# Patient Record
Sex: Female | Born: 1970 | Race: White | Hispanic: No | Marital: Married | Smoking: Never smoker
Health system: Southern US, Community
[De-identification: ages and names within clinical notes are randomized; demographics above are authoritative.]

## PROBLEM LIST (undated history)

## (undated) DIAGNOSIS — E039 Hypothyroidism, unspecified: Secondary | ICD-10-CM

## (undated) DIAGNOSIS — F419 Anxiety disorder, unspecified: Secondary | ICD-10-CM

## (undated) DIAGNOSIS — N943 Premenstrual tension syndrome: Secondary | ICD-10-CM

## (undated) DIAGNOSIS — R112 Nausea with vomiting, unspecified: Secondary | ICD-10-CM

## (undated) DIAGNOSIS — Z9889 Other specified postprocedural states: Secondary | ICD-10-CM

## (undated) DIAGNOSIS — M5416 Radiculopathy, lumbar region: Secondary | ICD-10-CM

## (undated) DIAGNOSIS — M1990 Unspecified osteoarthritis, unspecified site: Secondary | ICD-10-CM

## (undated) DIAGNOSIS — A6920 Lyme disease, unspecified: Secondary | ICD-10-CM

## (undated) HISTORY — DX: Lyme disease, unspecified: A69.20

## (undated) HISTORY — DX: Premenstrual tension syndrome: N94.3

## (undated) HISTORY — PX: BREAST ENHANCEMENT SURGERY: SHX7

## (undated) HISTORY — PX: ENDOMETRIAL ABLATION: SHX621

---

## 1996-02-02 HISTORY — PX: AUGMENTATION MAMMAPLASTY: SUR837

## 2004-07-30 ENCOUNTER — Emergency Department: Payer: Self-pay | Admitting: Emergency Medicine

## 2005-06-14 ENCOUNTER — Ambulatory Visit: Payer: Self-pay | Admitting: Family Medicine

## 2005-08-02 ENCOUNTER — Ambulatory Visit: Payer: Self-pay | Admitting: Family Medicine

## 2005-08-05 ENCOUNTER — Ambulatory Visit: Payer: Self-pay | Admitting: Family Medicine

## 2005-08-05 ENCOUNTER — Other Ambulatory Visit: Admission: RE | Admit: 2005-08-05 | Discharge: 2005-08-05 | Payer: Self-pay | Admitting: Internal Medicine

## 2005-08-05 ENCOUNTER — Encounter (INDEPENDENT_AMBULATORY_CARE_PROVIDER_SITE_OTHER): Payer: Self-pay | Admitting: Internal Medicine

## 2006-08-01 ENCOUNTER — Encounter (INDEPENDENT_AMBULATORY_CARE_PROVIDER_SITE_OTHER): Payer: Self-pay | Admitting: Internal Medicine

## 2006-08-01 DIAGNOSIS — Z87898 Personal history of other specified conditions: Secondary | ICD-10-CM

## 2006-08-01 DIAGNOSIS — N943 Premenstrual tension syndrome: Secondary | ICD-10-CM

## 2006-12-16 ENCOUNTER — Ambulatory Visit: Payer: Self-pay | Admitting: Family Medicine

## 2006-12-21 ENCOUNTER — Ambulatory Visit: Payer: Self-pay | Admitting: Family Medicine

## 2006-12-21 DIAGNOSIS — M25519 Pain in unspecified shoulder: Secondary | ICD-10-CM

## 2006-12-22 ENCOUNTER — Telehealth (INDEPENDENT_AMBULATORY_CARE_PROVIDER_SITE_OTHER): Payer: Self-pay | Admitting: *Deleted

## 2007-03-06 ENCOUNTER — Ambulatory Visit: Payer: Self-pay | Admitting: Family Medicine

## 2007-03-06 DIAGNOSIS — K5289 Other specified noninfective gastroenteritis and colitis: Secondary | ICD-10-CM

## 2007-03-07 ENCOUNTER — Telehealth (INDEPENDENT_AMBULATORY_CARE_PROVIDER_SITE_OTHER): Payer: Self-pay | Admitting: Internal Medicine

## 2007-03-08 ENCOUNTER — Telehealth (INDEPENDENT_AMBULATORY_CARE_PROVIDER_SITE_OTHER): Payer: Self-pay | Admitting: *Deleted

## 2007-09-12 ENCOUNTER — Encounter (INDEPENDENT_AMBULATORY_CARE_PROVIDER_SITE_OTHER): Payer: Self-pay | Admitting: Internal Medicine

## 2007-09-12 ENCOUNTER — Ambulatory Visit: Payer: Self-pay | Admitting: Family Medicine

## 2007-09-12 DIAGNOSIS — M545 Low back pain: Secondary | ICD-10-CM

## 2007-09-13 ENCOUNTER — Telehealth (INDEPENDENT_AMBULATORY_CARE_PROVIDER_SITE_OTHER): Payer: Self-pay | Admitting: Internal Medicine

## 2008-07-18 ENCOUNTER — Encounter (INDEPENDENT_AMBULATORY_CARE_PROVIDER_SITE_OTHER): Payer: Self-pay | Admitting: Internal Medicine

## 2010-06-19 ENCOUNTER — Telehealth: Payer: Self-pay | Admitting: *Deleted

## 2010-06-19 NOTE — Telephone Encounter (Signed)
Pt walked in complaining of having fluid in her ear for a couple of months.  She said this is not hurting or bothering her.  Asks what she should do, should she see an ENT.  Advised office visit next week to check, she asks if there is anything she can try first to see if it would help.  Advised she can take an otc antihistamine like claritin or zyrtec to see if that will help.

## 2010-06-19 NOTE — Telephone Encounter (Signed)
Agree with above 

## 2010-08-10 ENCOUNTER — Other Ambulatory Visit: Payer: Self-pay | Admitting: Family Medicine

## 2010-08-10 DIAGNOSIS — Z136 Encounter for screening for cardiovascular disorders: Secondary | ICD-10-CM

## 2010-08-10 DIAGNOSIS — Z0000 Encounter for general adult medical examination without abnormal findings: Secondary | ICD-10-CM

## 2010-08-11 ENCOUNTER — Other Ambulatory Visit (INDEPENDENT_AMBULATORY_CARE_PROVIDER_SITE_OTHER): Payer: BC Managed Care – PPO | Admitting: Family Medicine

## 2010-08-11 DIAGNOSIS — Z0000 Encounter for general adult medical examination without abnormal findings: Secondary | ICD-10-CM

## 2010-08-11 DIAGNOSIS — Z136 Encounter for screening for cardiovascular disorders: Secondary | ICD-10-CM

## 2010-08-11 LAB — BASIC METABOLIC PANEL
Calcium: 9.3 mg/dL (ref 8.4–10.5)
Creatinine, Ser: 1 mg/dL (ref 0.4–1.2)
GFR: 63.57 mL/min (ref >60.00)
Glucose, Bld: 90 mg/dL (ref 70–99)

## 2010-08-11 LAB — LIPID PANEL: HDL: 52.9 mg/dL (ref >39.00)

## 2010-08-14 ENCOUNTER — Encounter: Payer: Self-pay | Admitting: Family Medicine

## 2010-08-18 ENCOUNTER — Encounter: Payer: Self-pay | Admitting: Family Medicine

## 2010-08-18 ENCOUNTER — Other Ambulatory Visit (HOSPITAL_COMMUNITY)
Admission: RE | Admit: 2010-08-18 | Discharge: 2010-08-18 | Disposition: A | Discharge status: Home / Self Care | Payer: BC Managed Care – PPO | Source: Ambulatory Visit | Attending: Family Medicine | Admitting: Family Medicine

## 2010-08-18 ENCOUNTER — Ambulatory Visit (INDEPENDENT_AMBULATORY_CARE_PROVIDER_SITE_OTHER): Payer: BC Managed Care – PPO | Admitting: Family Medicine

## 2010-08-18 VITALS — BP 90/60 | HR 63 | Temp 97.5°F | Ht 62.0 in | Wt 133.5 lb

## 2010-08-18 DIAGNOSIS — Z01419 Encounter for gynecological examination (general) (routine) without abnormal findings: Secondary | ICD-10-CM | POA: Insufficient documentation

## 2010-08-18 DIAGNOSIS — Z1159 Encounter for screening for other viral diseases: Secondary | ICD-10-CM | POA: Insufficient documentation

## 2010-08-18 DIAGNOSIS — Z0000 Encounter for general adult medical examination without abnormal findings: Secondary | ICD-10-CM | POA: Insufficient documentation

## 2010-08-18 DIAGNOSIS — N920 Excessive and frequent menstruation with regular cycle: Secondary | ICD-10-CM

## 2010-08-18 MED ORDER — FLUOXETINE HCL 10 MG PO CAPS: 10.0000 mg | ORAL_CAPSULE | Freq: Every day | ORAL | Status: DC

## 2010-08-18 NOTE — Patient Instructions (Signed)
Great to meet you. Please stop by to see Brittany Sanchez on your way out. 

## 2010-08-18 NOTE — Progress Notes (Signed)
40 yo here for CPX/Pap.  G2P2.  H/o  menorrhagia.  Was going to OBGYN- tried IUD, OCPs, made her very emotionally labile. Gyn suggested hysterectomy, but she feels too young for that. Wants a second opinion. Periods can last for two weeks, at least 2 days, she bleeds through her clothes. Sometimes dizzy after her periods.  Due for mammogram, does not want to schedule one yet.  Patient Active Problem List  Diagnoses  . GASTROENTERITIS  . PMS  . SHOULDER PAIN, RIGHT  . BACK PAIN, LUMBAR  . SHINGLES, HX OF  . Routine general medical examination at a health care facility  . Menorrhagia   No past medical history on file. Past Surgical History  Procedure Date  . Breast enhancement surgery    History  Substance Use Topics  . Smoking status: Never Smoker   . Smokeless tobacco: Not on file  . Alcohol Use: Not on file   Family History  Problem Relation Age of Onset  . Cancer Mother 73    mesothelioma  . Sleep apnea Brother   . Cancer Maternal Grandmother     ? ovarian  . Diabetes Maternal Grandfather   . Alcohol abuse Other   . Depression Other   . Cancer Father 70    mesothelioma   No Known Allergies Current Outpatient Prescriptions on File Prior to Visit  Medication Sig Dispense Refill  . ibuprofen (ADVIL,MOTRIN) 800 MG tablet Take 800 mg by mouth every 8 (eight) hours as needed.         The PMH, PSH, Social History, Family History, Medications, and allergies have been reviewed in [facility], and have been updated if relevant.  ROS: See HPI Patient reports no  vision/ hearing changes,anorexia, weight change, fever ,adenopathy, persistant / recurrent hoarseness, swallowing issues, chest pain, edema,persistant / recurrent cough, hemoptysis, dyspnea(rest, exertional, paroxysmal nocturnal), gastrointestinal  bleeding (melena, rectal bleeding), abdominal pain, excessive heart burn, GU symptoms(dysuria, hematuria, pyuria, voiding/incontinence  Issues) syncope, focal weakness, severe  memory loss, concerning skin lesions, depression, anxiety, abnormal bruising/bleeding, major joint swelling, breast masses or abnormal vaginal bleeding.     The PMH, PSH, Social History, Family History, Medications, and allergies have been reviewed in [facility], and have been updated if relevant.  Physical exam: BP 90/60  Pulse 63  Temp(Src) 97.5 F (36.4 C) (Oral)  Ht 5\' 2"  (1.575 m)  Wt 133 lb 8 oz (60.555 kg)  BMI 24.42 kg/m2  LMP 08/02/2010  General:  Well-developed,well-nourished,in no acute distress; alert,appropriate and cooperative throughout examination Head:  normocephalic and atraumatic.   Eyes:  vision grossly intact, pupils equal, pupils round, and pupils reactive to light.   Ears:  R ear normal and L ear normal.   Nose:  no external deformity.   Mouth:  good dentition.   Neck:  No deformities, masses, or tenderness noted. Breasts:  No mass, nodules, thickening, tenderness, bulging, retraction, inflamation, nipple discharge or skin changes noted, +breast implants bilaterally  Lungs:  Normal respiratory effort, chest expands symmetrically. Lungs are clear to auscultation, no crackles or wheezes. Heart:  Normal rate and regular rhythm. S1 and S2 normal without gallop, murmur, click, rub or other extra sounds. Abdomen:  Bowel sounds positive,abdomen soft and non-tender without masses, organomegaly or hernias noted. Rectal:  no external abnormalities.   Genitalia:  Pelvic Exam:        External: normal female genitalia without lesions or masses        Vagina: normal without lesions or masses  Cervix: normal without lesions or masses        Adnexa: normal bimanual exam without masses or fullness        Uterus: normal by palpation        Pap smear: performed Msk:  No deformity or scoliosis noted of thoracic or lumbar spine.   Extremities:  No clubbing, cyanosis, edema, or deformity noted with normal full range of motion of all joints.   Neurologic:  alert & oriented X3 and  gait normal.   Skin:  Intact without suspicious lesions or rashes Cervical Nodes:  No lymphadenopathy noted Axillary Nodes:  No palpable lymphadenopathy Psych:  Cognition and judgment appear intact. Alert and cooperative with normal attention span and concentration. No apparent delusions, illusions, hallucinations  Assessment and Plan: 1. Routine general medical examination at a health care facility    Reviewed preventive care protocols, scheduled due services, and updated immunizations Discussed nutrition, exercise, diet, and healthy lifestyle.  Declined mammogram.  2. Menorrhagia  Ambulatory referral to Gynecology   Deteriorated. Discussed possible endometrial ablation as an option. She would like referral to another GYN. Referral placed.

## 2010-08-24 ENCOUNTER — Encounter: Payer: Self-pay | Admitting: *Deleted

## 2010-09-19 ENCOUNTER — Other Ambulatory Visit: Payer: Self-pay | Admitting: Family Medicine

## 2011-01-11 ENCOUNTER — Encounter (HOSPITAL_COMMUNITY): Payer: Self-pay | Admitting: Pharmacist

## 2011-01-18 ENCOUNTER — Other Ambulatory Visit (HOSPITAL_COMMUNITY): Payer: BC Managed Care – PPO

## 2011-01-19 ENCOUNTER — Inpatient Hospital Stay (HOSPITAL_COMMUNITY)
Admission: RE | Admit: 2011-01-19 | Payer: BC Managed Care – PPO | Source: Ambulatory Visit | Admitting: Obstetrics and Gynecology

## 2011-01-19 ENCOUNTER — Encounter (HOSPITAL_COMMUNITY): Admission: RE | Payer: Self-pay | Source: Ambulatory Visit

## 2011-01-19 SURGERY — HYSTERECTOMY, SUPRACERVICAL, LAPAROSCOPIC
Anesthesia: General

## 2011-04-23 ENCOUNTER — Ambulatory Visit: Payer: BC Managed Care – PPO | Admitting: Family Medicine

## 2011-07-21 ENCOUNTER — Other Ambulatory Visit: Payer: Self-pay | Admitting: Family Medicine

## 2011-10-27 ENCOUNTER — Other Ambulatory Visit: Payer: Self-pay | Admitting: Family Medicine

## 2011-10-27 DIAGNOSIS — Z136 Encounter for screening for cardiovascular disorders: Secondary | ICD-10-CM

## 2011-10-27 DIAGNOSIS — Z0000 Encounter for general adult medical examination without abnormal findings: Secondary | ICD-10-CM

## 2011-11-01 ENCOUNTER — Other Ambulatory Visit (INDEPENDENT_AMBULATORY_CARE_PROVIDER_SITE_OTHER): Payer: BC Managed Care – PPO

## 2011-11-01 DIAGNOSIS — Z136 Encounter for screening for cardiovascular disorders: Secondary | ICD-10-CM

## 2011-11-01 DIAGNOSIS — Z0000 Encounter for general adult medical examination without abnormal findings: Secondary | ICD-10-CM

## 2011-11-01 LAB — COMPREHENSIVE METABOLIC PANEL
ALT: 16 U/L (ref 0–35)
AST: 24 U/L (ref 0–37)
Calcium: 9 mg/dL (ref 8.4–10.5)
Chloride: 105 mEq/L (ref 96–112)
Creatinine, Ser: 1 mg/dL (ref 0.4–1.2)
Potassium: 4.2 mEq/L (ref 3.5–5.1)
Sodium: 137 mEq/L (ref 135–145)
Total Protein: 6.5 g/dL (ref 6.0–8.3)

## 2011-11-01 LAB — LIPID PANEL
HDL: 49.6 mg/dL (ref >39.00)
Total CHOL/HDL Ratio: 3

## 2011-11-04 ENCOUNTER — Encounter: Payer: Self-pay | Admitting: Family Medicine

## 2011-11-04 ENCOUNTER — Ambulatory Visit (INDEPENDENT_AMBULATORY_CARE_PROVIDER_SITE_OTHER): Payer: BC Managed Care – PPO | Admitting: Family Medicine

## 2011-11-04 ENCOUNTER — Other Ambulatory Visit (HOSPITAL_COMMUNITY)
Admission: RE | Admit: 2011-11-04 | Discharge: 2011-11-04 | Disposition: A | Discharge status: Home / Self Care | Payer: BC Managed Care – PPO | Source: Ambulatory Visit | Attending: Family Medicine | Admitting: Family Medicine

## 2011-11-04 VITALS — BP 102/70 | HR 60 | Temp 98.0°F | Ht 62.75 in | Wt 133.0 lb

## 2011-11-04 DIAGNOSIS — Z01419 Encounter for gynecological examination (general) (routine) without abnormal findings: Secondary | ICD-10-CM | POA: Insufficient documentation

## 2011-11-04 DIAGNOSIS — Z1231 Encounter for screening mammogram for malignant neoplasm of breast: Secondary | ICD-10-CM

## 2011-11-04 DIAGNOSIS — N920 Excessive and frequent menstruation with regular cycle: Secondary | ICD-10-CM

## 2011-11-04 DIAGNOSIS — Z0000 Encounter for general adult medical examination without abnormal findings: Secondary | ICD-10-CM

## 2011-11-04 DIAGNOSIS — N943 Premenstrual tension syndrome: Secondary | ICD-10-CM

## 2011-11-04 DIAGNOSIS — Z1151 Encounter for screening for human papillomavirus (HPV): Secondary | ICD-10-CM | POA: Insufficient documentation

## 2011-11-04 MED ORDER — FLUOXETINE HCL 40 MG PO CAPS: 40.0000 mg | ORAL_CAPSULE | Freq: Every day | ORAL | Status: DC

## 2011-11-04 NOTE — Progress Notes (Signed)
Subjective:    Patient ID: Brittany Sanchez, female    DOB: 1970-10-16, 41 y.o.   MRN: [id number]  HPI  41 yo G3P2 here for CPX.  H/o menorrhagia. Was going to OBGYN- tried IUD, OCPs, made her very emotionally labile.  Gyn suggested hysterectomy, but she feels too young for that.  Bleeding has actually improved recently.  Mood has improved with prozac- she has been doubling the dose and taking 40 mg daily.  Mom died in her 68s of cancer although they were not sure where it originated- diffusely metastatic at presentation- ?mesothelioma.  Due for mammogram.  Lab Results  Component Value Date   CHOL 142 11/01/2011   HDL 49.60 11/01/2011   LDLCALC 76 11/01/2011   TRIG 82.0 11/01/2011   CHOLHDL 3 11/01/2011   Patient Active Problem List  Diagnosis  . GASTROENTERITIS  . PMS  . SHOULDER PAIN, RIGHT  . BACK PAIN, LUMBAR  . SHINGLES, HX OF  . Routine general medical examination at a health care facility  . Menorrhagia   No past medical history on file. Past Surgical History  Procedure Date  . Breast enhancement surgery    History  Substance Use Topics  . Smoking status: Never Smoker   . Smokeless tobacco: Not on file  . Alcohol Use: Not on file   Family History  Problem Relation Age of Onset  . Cancer Mother 4    mesothelioma  . Sleep apnea Brother   . Cancer Maternal Grandmother     ? ovarian  . Diabetes Maternal Grandfather   . Alcohol abuse Other   . Depression Other   . Cancer Father 85    mesothelioma   No Known Allergies Current Outpatient Prescriptions on File Prior to Visit  Medication Sig Dispense Refill  . ibuprofen (ADVIL,MOTRIN) 800 MG tablet Take 800 mg by mouth every 8 (eight) hours as needed.        Marland Kitchen DISCONTD: FLUoxetine (PROZAC) 20 MG capsule TAKE 1 CAPSULE BY MOUTH EVERY DAY.  30 capsule  2  . DISCONTD: FLUoxetine (PROZAC) 10 MG capsule Take 1 capsule (10 mg total) by mouth daily.  30 capsule  6   The PMH, PSH, Social History, Family History,  Medications, and allergies have been reviewed in [facility], and have been updated if relevant.     Review of Systems See HPI Patient reports no  vision/ hearing changes,anorexia, weight change, fever ,adenopathy, persistant / recurrent hoarseness, swallowing issues, chest pain, edema,persistant / recurrent cough, hemoptysis, dyspnea(rest, exertional, paroxysmal nocturnal), gastrointestinal  bleeding (melena, rectal bleeding), abdominal pain, excessive heart burn, GU symptoms(dysuria, hematuria, pyuria, voiding/incontinence  Issues) syncope, focal weakness, severe memory loss, concerning skin lesions, depression, anxiety, abnormal bruising/bleeding, major joint swelling, breast masses or abnormal vaginal bleeding.       Objective:   Physical Exam BP 102/70  Pulse 60  Temp 98 F (36.7 C)  Ht 5' 2.75" (1.594 m)  Wt 133 lb (60.328 kg)  BMI 23.75 kg/m2  General:  Well-developed,well-nourished,in no acute distress; alert,appropriate and cooperative throughout examination Head:  normocephalic and atraumatic.   Eyes:  vision grossly intact, pupils equal, pupils round, and pupils reactive to light.   Ears:  R ear normal and L ear normal.   Nose:  no external deformity.   Mouth:  good dentition.   Neck:  No deformities, masses, or tenderness noted. Breasts:  Bilateral breast implants, No mass, nodules, thickening, tenderness, bulging, retraction, inflamation, nipple discharge or skin changes noted.   Lungs:  Normal respiratory effort, chest expands symmetrically. Lungs are clear to auscultation, no crackles or wheezes. Heart:  Normal rate and regular rhythm. S1 and S2 normal without gallop, murmur, click, rub or other extra sounds. Abdomen:  Bowel sounds positive,abdomen soft and non-tender without masses, organomegaly or hernias noted. Rectal:  no external abnormalities.   Genitalia:  Pelvic Exam:        External: normal female genitalia without lesions or masses        Vagina: normal without  lesions or masses        Cervix: normal without lesions or masses        Adnexa: normal bimanual exam without masses or fullness        Uterus: normal by palpation        Pap smear: performed Msk:  No deformity or scoliosis noted of thoracic or lumbar spine.   Extremities:  No clubbing, cyanosis, edema, or deformity noted with normal full range of motion of all joints.   Neurologic:  alert & oriented X3 and gait normal.   Skin:  Intact without suspicious lesions or rashes Cervical Nodes:  No lymphadenopathy noted Axillary Nodes:  No palpable lymphadenopathy Psych:  Cognition and judgment appear intact. Alert and cooperative with normal attention span and concentration. No apparent delusions, illusions, hallucinations     Assessment & Plan:   1. Other screening mammogram  MM Digital Screening,   2. Routine general medical examination at a health care facility  Reviewed preventive care protocols, scheduled due services, and updated immunizations Discussed nutrition, exercise, diet, and healthy lifestyle.  Cytology - PAP  3. Menorrhagia  Symptoms improving.   4. PMS  Improved with increased dose of Prozac.

## 2011-11-04 NOTE — Patient Instructions (Addendum)
Good to see you. Please stop by to see Shirlee Limerick on your way out to set up your mammogram.  Health Maintenance, Females A healthy lifestyle and preventative care can promote health and wellness.  Maintain regular health, dental, and eye exams.  Eat a healthy diet. Foods like vegetables, fruits, whole grains, low-fat dairy products, and lean protein foods contain the nutrients you need without too many calories. Decrease your intake of foods high in solid fats, added sugars, and salt. Get information about a proper diet from your caregiver, if necessary.  Regular physical exercise is one of the most important things you can do for your health. Most adults should get at least 150 minutes of moderate-intensity exercise (any activity that increases your heart rate and causes you to sweat) each week. In addition, most adults need muscle-strengthening exercises on 2 or more days a week.   Maintain a healthy weight. The body mass index (BMI) is a screening tool to identify possible weight problems. It provides an estimate of body fat based on height and weight. Your caregiver can help determine your BMI, and can help you achieve or maintain a healthy weight. For adults 20 years and older:  A BMI below 18.5 is considered underweight.  A BMI of 18.5 to 24.9 is normal.  A BMI of 25 to 29.9 is considered overweight.  A BMI of 30 and above is considered obese.  Maintain normal blood lipids and cholesterol by exercising and minimizing your intake of saturated fat. Eat a balanced diet with plenty of fruits and vegetables. Blood tests for lipids and cholesterol should begin at age 55 and be repeated every 5 years. If your lipid or cholesterol levels are high, you are over 50, or you are a high risk for heart disease, you may need your cholesterol levels checked more frequently.Ongoing high lipid and cholesterol levels should be treated with medicines if diet and exercise are not effective.  If you smoke,  find out from your caregiver how to quit. If you do not use tobacco, do not start.  If you are pregnant, do not drink alcohol. If you are breastfeeding, be very cautious about drinking alcohol. If you are not pregnant and choose to drink alcohol, do not exceed 1 drink per day. One drink is considered to be 12 ounces (355 mL) of beer, 5 ounces (148 mL) of wine, or 1.5 ounces (44 mL) of liquor.  Avoid use of street drugs. Do not share needles with anyone. Ask for help if you need support or instructions about stopping the use of drugs.  High blood pressure causes heart disease and increases the risk of stroke. Blood pressure should be checked at least every 1 to 2 years. Ongoing high blood pressure should be treated with medicines, if weight loss and exercise are not effective.  If you are 28 to 42 years old, ask your caregiver if you should take aspirin to prevent strokes.  Diabetes screening involves taking a blood sample to check your fasting blood sugar level. This should be done once every 3 years, after age 43, if you are within normal weight and without risk factors for diabetes. Testing should be considered at a younger age or be carried out more frequently if you are overweight and have at least 1 risk factor for diabetes.  Breast cancer screening is essential preventative care for women. You should practice "breast self-awareness." This means understanding the normal appearance and feel of your breasts and may include breast self-examination. Any  changes detected, no matter how small, should be reported to a caregiver. Women in their 65s and 30s should have a clinical breast exam (CBE) by a caregiver as part of a regular health exam every 1 to 3 years. After age 23, women should have a CBE every year. Starting at age 48, women should consider having a mammogram (breast X-ray) every year. Women who have a family history of breast cancer should talk to their caregiver about genetic screening. Women  at a high risk of breast cancer should talk to their caregiver about having an MRI and a mammogram every year.  The human papillomavirus (HPV) test is an additional test that may be used for cervical cancer screening. The HPV test looks for the virus that can cause the cell changes on the cervix. The cells collected during the Pap test can be tested for HPV. The HPV test could be used to screen women aged 84 years and older, and should be used in women of any age who have unclear Pap test results. After the age of 54, women should have HPV testing at the same frequency as a Pap test.  Use sunscreen with a sun protection factor (SPF) of 30 or greater. Apply sunscreen liberally and repeatedly throughout the day. You should seek shade when your shadow is shorter than you. Protect yourself by wearing long sleeves, pants, a wide-brimmed hat, and sunglasses year round, whenever you are outdoors.  Notify your caregiver of new moles or changes in moles, especially if there is a change in shape or color. Also notify your caregiver if a mole is larger than the size of a pencil eraser.  Stay current with your immunizations. Document Released: 08/03/2010 Document Revised: 04/12/2011 Document Reviewed: 08/03/2010 [facility] Patient Information 2013 [place], [place].

## 2011-11-10 ENCOUNTER — Encounter: Payer: Self-pay | Admitting: Family Medicine

## 2011-11-10 ENCOUNTER — Encounter: Payer: Self-pay | Admitting: *Deleted

## 2012-04-26 ENCOUNTER — Ambulatory Visit (INDEPENDENT_AMBULATORY_CARE_PROVIDER_SITE_OTHER): Payer: BC Managed Care – PPO | Admitting: Family Medicine

## 2012-04-26 ENCOUNTER — Encounter: Payer: Self-pay | Admitting: Family Medicine

## 2012-04-26 ENCOUNTER — Telehealth: Payer: Self-pay

## 2012-04-26 VITALS — BP 112/80 | HR 64 | Temp 97.9°F | Wt 141.0 lb

## 2012-04-26 DIAGNOSIS — F419 Anxiety disorder, unspecified: Secondary | ICD-10-CM | POA: Insufficient documentation

## 2012-04-26 DIAGNOSIS — R251 Tremor, unspecified: Secondary | ICD-10-CM | POA: Insufficient documentation

## 2012-04-26 DIAGNOSIS — R259 Unspecified abnormal involuntary movements: Secondary | ICD-10-CM

## 2012-04-26 DIAGNOSIS — F32A Depression, unspecified: Secondary | ICD-10-CM | POA: Insufficient documentation

## 2012-04-26 DIAGNOSIS — F4323 Adjustment disorder with mixed anxiety and depressed mood: Secondary | ICD-10-CM

## 2012-04-26 NOTE — Patient Instructions (Addendum)
Your EKG looks great! Let's increase your prozac- please take daily for next 3 weeks and call me with an update.

## 2012-04-26 NOTE — Addendum Note (Signed)
Addended by: Eliezer Bottom on: 04/26/2012 12:04 PM   Modules accepted: Orders

## 2012-04-26 NOTE — Telephone Encounter (Signed)
Pt had episode of trembling with lt shoulder pain earlier this morning; pt ate granola bar and 15 mins later trembling stopped but pt still has pain in lt shoulder and shoulder blade with light headedness. Pt saw nurse at work and BP 120/78 and was advised could be reaction to starting meloxicam  2 days ago. Pt request appt to see Dr Dayton Martes. Pt scheduled appt today at 10:15 am. Pt does not have chest pain or SOB. Advised pt if condition changes or worsens to call back prior to appt. Pt voiced understanding.

## 2012-04-26 NOTE — Progress Notes (Signed)
Subjective:    Patient ID: Brittany Sanchez, female    DOB: 21-Aug-1970, 42 y.o.   MRN: [id number]  HPI 42 yo here for acute onset of shaking and left shoulder blade pain.  She was at work, about to start training about an 1.5 hours ago.  She has been under more stress lately but not this morning.  Had not yet eaten.  Had all over body shaking left shoulder blade pain.  She did feel a little short of breath.  No CP.  No nausea, vomiting or diaphoresis.  No family h/o heart disease.  Has not been sleeping well. Increased anxiety since husband quit his job.  She does have prozac that she takes for PMS but not regularly.  Patient Active Problem List  Diagnosis  . GASTROENTERITIS  . PMS  . SHOULDER PAIN, RIGHT  . BACK PAIN, LUMBAR  . SHINGLES, HX OF  . Routine general medical examination at a health care facility  . Menorrhagia   No past medical history on file. Past Surgical History  Procedure Laterality Date  . Breast enhancement surgery     History  Substance Use Topics  . Smoking status: Never Smoker   . Smokeless tobacco: Not on file  . Alcohol Use: Not on file   Family History  Problem Relation Age of Onset  . Cancer Mother 56    mesothelioma  . Sleep apnea Brother   . Cancer Maternal Grandmother     ? ovarian  . Diabetes Maternal Grandfather   . Alcohol abuse Other   . Depression Other   . Cancer Father 29    mesothelioma   No Known Allergies Current Outpatient Prescriptions on File Prior to Visit  Medication Sig Dispense Refill  . FLUoxetine (PROZAC) 40 MG capsule Take 1 capsule (40 mg total) by mouth daily.  30 capsule  6  . ibuprofen (ADVIL,MOTRIN) 800 MG tablet Take 800 mg by mouth every 8 (eight) hours as needed.        . Multiple Vitamin (MULTIVITAMIN) tablet Take 1 tablet by mouth daily.       No current facility-administered medications on file prior to visit.   The PMH, PSH, Social History, Family History, Medications, and allergies have been reviewed  in [facility], and have been updated if relevant.    Review of Systems See HPI    Objective:   Physical Exam BP 112/80  Pulse 64  Temp(Src) 97.9 F (36.6 C)  Wt 141 lb (63.957 kg)  BMI 25.17 kg/m2  General:  Well-developed,well-nourished,in no acute distress; alert,appropriate and cooperative throughout examination Head:  normocephalic and atraumatic.   Eyes:  vision grossly intact, pupils equal, pupils round, and pupils reactive to light.   Ears:  R ear normal and L ear normal.   Nose:  no external deformity.   Mouth:  good dentition.   Neck:  No deformities, masses, or tenderness noted. Lungs:  Normal respiratory effort, chest expands symmetrically. Lungs are clear to auscultation, no crackles or wheezes. Heart:  Normal rate and regular rhythm. S1 and S2 normal without gallop, murmur, click, rub or other extra sounds. Abdomen:  Bowel sounds positive,abdomen soft and non-tender without masses, organomegaly or hernias noted. Msk:  No deformity or scoliosis noted of thoracic or lumbar spine.   FROM of shoulders bilaterally Extremities:  No clubbing, cyanosis, edema, or deformity noted with normal full range of motion of all joints.   Neurologic:  alert & oriented X3 and gait normal.   Skin:  Intact without suspicious lesions or rashes Cervical Nodes:  No lymphadenopathy noted Axillary Nodes:  No palpable lymphadenopathy Psych:  Cognition and judgment appear intact. Alert and cooperative with normal attention span and concentration. No apparent delusions, illusions, hallucinations     Assessment & Plan:  1. Shakiness Resolved- CBG normal, EKG NSR.  ? Possible panic attack.  See below. - EKG 12-Lead  2. Adjustment disorder with mixed anxiety and depressed mood New- advised to increase prozac to take daily.  She will call me in 3 weeks with an update or sooner if symptoms return.

## 2012-09-14 ENCOUNTER — Telehealth: Payer: Self-pay | Admitting: Family Medicine

## 2012-09-14 NOTE — Telephone Encounter (Signed)
Patient Information:  Caller Name: Symphani  Phone: [phone]  Patient: Brittany Sanchez, Brittany Sanchez  Gender: Female  DOB: [birth year]  Age: 42 Years  PCP: Ruthe Mannan [facility])  Pregnant: No  Office Follow Up:  Does the office need to follow up with this patient?: No  Instructions For The Office: N/A  RN Note:  No IUD or BCP use.  States had normal period beginning 09/01/12 which finished by 09/08/12 but started bleeding again 09/13/12 PM.  Using a pad; bleeding is scant.  Denies emergent symptoms per vaginal bleeding protocol; advised appt within 2 weeks due to "Age > 39 years with irregular bleeding."  Patient declines appt at this time, but states will monitor and call for appt if bleeding continues.  krs/can  Symptoms  Reason For Call & Symptoms: vaginal bleeding  Reviewed Health History In EMR: Yes  Reviewed Medications In EMR: Yes  Reviewed Allergies In EMR: Yes  Reviewed Surgeries / Procedures: Yes  Date of Onset of Symptoms: 09/14/2012 OB / GYN:  LMP: 09/01/2012  Guideline(s) Used:  Vaginal Bleeding - Abnormal  Disposition Per Guideline:   See Within 2 Weeks in Office  Reason For Disposition Reached:   Age > 39 years with irregular or excessive bleeding  Advice Given:  N/A  Patient Refused Recommendation:  Patient Will Make Own Appointment  will make own appt krs/can

## 2012-09-15 NOTE — Telephone Encounter (Signed)
Noted  

## 2012-12-17 ENCOUNTER — Other Ambulatory Visit: Payer: Self-pay | Admitting: Family Medicine

## 2012-12-17 NOTE — Telephone Encounter (Signed)
Last office visit 04/26/2012.  Ok to refill?

## 2013-03-16 ENCOUNTER — Encounter: Payer: Self-pay | Admitting: Obstetrics & Gynecology

## 2013-03-16 ENCOUNTER — Ambulatory Visit (INDEPENDENT_AMBULATORY_CARE_PROVIDER_SITE_OTHER): Payer: BC Managed Care – PPO | Admitting: Obstetrics & Gynecology

## 2013-03-16 VITALS — BP 95/63 | HR 55 | Ht 62.0 in | Wt 133.0 lb

## 2013-03-16 DIAGNOSIS — N920 Excessive and frequent menstruation with regular cycle: Secondary | ICD-10-CM

## 2013-03-16 DIAGNOSIS — N889 Noninflammatory disorder of cervix uteri, unspecified: Secondary | ICD-10-CM

## 2013-03-16 DIAGNOSIS — Z124 Encounter for screening for malignant neoplasm of cervix: Secondary | ICD-10-CM

## 2013-03-16 DIAGNOSIS — Z01419 Encounter for gynecological examination (general) (routine) without abnormal findings: Secondary | ICD-10-CM

## 2013-03-16 DIAGNOSIS — Z0000 Encounter for general adult medical examination without abnormal findings: Secondary | ICD-10-CM

## 2013-03-16 DIAGNOSIS — Z1151 Encounter for screening for human papillomavirus (HPV): Secondary | ICD-10-CM

## 2013-03-16 LAB — COMPREHENSIVE METABOLIC PANEL
ALBUMIN: 4.5 g/dL (ref 3.5–5.2)
ALT: 13 U/L (ref 0–35)
AST: 21 U/L (ref 0–37)
Alkaline Phosphatase: 33 U/L — ABNORMAL LOW (ref 39–117)
BUN: 16 mg/dL (ref 6–23)
CALCIUM: 9.7 mg/dL (ref 8.4–10.5)
CHLORIDE: 101 meq/L (ref 96–112)
CO2: 30 mEq/L (ref 19–32)
Creat: 1.05 mg/dL (ref 0.50–1.10)
GLUCOSE: 89 mg/dL (ref 70–99)
POTASSIUM: 4.1 meq/L (ref 3.5–5.3)
SODIUM: 138 meq/L (ref 135–145)
TOTAL PROTEIN: 6.7 g/dL (ref 6.0–8.3)
Total Bilirubin: 0.6 mg/dL (ref 0.2–1.2)

## 2013-03-16 LAB — LIPID PANEL
CHOLESTEROL: 138 mg/dL (ref 0–200)
HDL: 52 mg/dL (ref >39)
LDL Cholesterol: 71 mg/dL (ref 0–99)
Total CHOL/HDL Ratio: 2.7 Ratio
Triglycerides: 76 mg/dL (ref <150)
VLDL: 15 mg/dL (ref 0–40)

## 2013-03-16 LAB — CBC
HCT: 39.1 % (ref 36.0–46.0)
Hemoglobin: 13.4 g/dL (ref 12.0–15.0)
MCH: 30.5 pg (ref 26.0–34.0)
MCHC: 34.3 g/dL (ref 30.0–36.0)
MCV: 88.9 fL (ref 78.0–100.0)
PLATELETS: 210 10*3/uL (ref 150–400)
RBC: 4.4 MIL/uL (ref 3.87–5.11)
RDW: 13.4 % (ref 11.5–15.5)
WBC: 7.3 10*3/uL (ref 4.0–10.5)

## 2013-03-16 NOTE — Progress Notes (Signed)
Patient is interested in having a hysterectomy due to very extreme symptoms with her cycle.  She is bleeding for 12 days a month.  Bleeds when she ovulates as well.  She does use the Prozac 40 for the emotional symptoms related to her cycle as well.  She used to just take it the week of but now she takes it every day due to her irregular cycles.

## 2013-03-16 NOTE — Progress Notes (Signed)
Subjective:    Brittany Sanchez is a 43 y.o. female who presents for an annual exam. She complains of heavy, long periods, with BTB. She also says that her cycle is now q 21 days instead of q 28 days, bleeds for 14 days. And her mood/PMDD/PMS is getting worse.  The patient is sexually active. GYN screening history: last pap: was normal. The patient wears seatbelts: yes. The patient participates in regular exercise: yes. Has the patient ever been transfused or tattooed?: no. The patient reports that there is not domestic violence in her life.   Menstrual History: OB History   Grav Para Term Preterm Abortions TAB SAB Ect Mult Living                  Menarche age: 43   Patient's last menstrual period was 03/15/2013.    The following portions of the patient's history were reviewed and updated as appropriate: allergies, current medications, past family history, past medical history, past social history, past surgical history and problem list.  Review of Systems Pertinent items are noted in HPI. Arboriculturist. Married for 10 years, denies dyspareunia. Declines flu vaccine. She uses withdrawal and timing for contraception. She was previously scheduled to have a hyster and BSO by [facility]. She tried Mirena without success. Mood disorder gets much worse with OCPs.   Objective:    BP 95/63  Pulse 55  Ht 5\' 2"  (1.575 m)  Wt 133 lb (60.328 kg)  BMI 24.32 kg/m2  LMP 03/15/2013  General Appearance:    Alert, cooperative, no distress, appears stated age  Head:    Normocephalic, without obvious abnormality, atraumatic  Eyes:    PERRL, conjunctiva/corneas clear, EOM's intact, fundi    benign, both eyes  Ears:    Normal TM's and external ear canals, both ears  Nose:   Nares normal, septum midline, mucosa normal, no drainage    or sinus tenderness  Throat:   Lips, mucosa, and tongue normal; teeth and gums normal  Neck:   Supple, symmetrical, trachea midline, no adenopathy;    thyroid:  no  enlargement/tenderness/nodules; no carotid   bruit or JVD  Back:     Symmetric, no curvature, ROM normal, no CVA tenderness  Lungs:     Clear to auscultation bilaterally, respirations unlabored  Chest Wall:    No tenderness or deformity   Heart:    Regular rate and rhythm, S1 and S2 normal, no murmur, rub   or gallop  Breast Exam:    No tenderness, masses, or nipple abnormality  Abdomen:     Soft, non-tender, bowel sounds active all four quadrants,    no masses, no organomegaly  Genitalia:    Normal female without lesion, discharge or tenderness, posterior lip of cervix friable and irregular in size and shape (I biopsied it and used silver nitrate to yield hemostasis), NSSR, NT, mobile, normal adnexal exam     Extremities:   Extremities normal, atraumatic, no cyanosis or edema  Pulses:   2+ and symmetric all extremities  Skin:   Skin color, texture, turgor normal, no rashes or lesions  Lymph nodes:   Cervical, supraclavicular, and axillary nodes normal  Neurologic:   CNII-XII intact, normal strength, sensation and reflexes    throughout  .    Assessment:    Healthy female exam.  Menorrhagia, BTB ? Cervical abnormality   Plan:     Breast self exam technique reviewed and patient encouraged to perform self-exam monthly. Mammogram. Thin  prep Pap smear. with cotesting Await cervical biopsy

## 2013-03-17 LAB — TSH
TSH: 1.336 u[IU]/mL (ref 0.350–4.500)
TSH: 1.337 u[IU]/mL (ref 0.350–4.500)

## 2013-03-17 LAB — GC/CHLAMYDIA PROBE AMP
CT Probe RNA: NEGATIVE
GC PROBE AMP APTIMA: NEGATIVE

## 2013-03-23 ENCOUNTER — Telehealth: Payer: Self-pay | Admitting: *Deleted

## 2013-03-23 ENCOUNTER — Ambulatory Visit (HOSPITAL_COMMUNITY): Payer: BC Managed Care – PPO

## 2013-03-26 ENCOUNTER — Ambulatory Visit: Payer: BC Managed Care – PPO | Admitting: Obstetrics & Gynecology

## 2013-04-05 NOTE — Telephone Encounter (Signed)
Patient called to cancel her appointment and reschedule for another day.

## 2013-05-18 ENCOUNTER — Encounter (HOSPITAL_COMMUNITY): Payer: Self-pay | Admitting: *Deleted

## 2013-05-18 ENCOUNTER — Inpatient Hospital Stay (HOSPITAL_COMMUNITY)
Admission: AD | Admit: 2013-05-18 | Discharge: 2013-05-18 | Disposition: A | Discharge status: Home / Self Care | Payer: BC Managed Care – PPO | Source: Ambulatory Visit | Attending: Obstetrics and Gynecology | Admitting: Obstetrics and Gynecology

## 2013-05-18 ENCOUNTER — Other Ambulatory Visit: Payer: Self-pay | Admitting: Obstetrics & Gynecology

## 2013-05-18 DIAGNOSIS — G8918 Other acute postprocedural pain: Secondary | ICD-10-CM | POA: Insufficient documentation

## 2013-05-18 MED ORDER — LACTATED RINGERS IV SOLN
INTRAVENOUS | Status: DC
  Administered 2013-05-18: 12:00:00 via INTRAVENOUS

## 2013-05-18 MED ORDER — HYDROMORPHONE HCL PF 1 MG/ML IJ SOLN
1.0000 mg | Freq: Once | INTRAMUSCULAR | Status: AC
  Administered 2013-05-18: 1 mg via INTRAVENOUS
  Filled 2013-05-18: qty 1

## 2013-05-18 MED ORDER — ONDANSETRON HCL 4 MG PO TABS: 4.0000 mg | ORAL_TABLET | Freq: Four times a day (QID) | ORAL | Status: DC

## 2013-05-18 MED ORDER — HYDROMORPHONE HCL 2 MG PO TABS: 2.0000 mg | ORAL_TABLET | ORAL | Status: DC | PRN

## 2013-05-18 MED ORDER — PROMETHAZINE HCL 25 MG/ML IJ SOLN
12.5000 mg | Freq: Once | INTRAMUSCULAR | Status: AC
  Administered 2013-05-18: 12.5 mg via INTRAVENOUS
  Filled 2013-05-18: qty 1

## 2013-05-18 NOTE — MAU Note (Addendum)
[place] 2L removed; patient sats 100% RA--RR 17. Vital signs taken

## 2013-05-18 NOTE — MAU Note (Signed)
Patient sats 84%-RR12 following pain medication. Vitals taken. Encouraged to take deep breaths and stimulation attempted. O2 Bent at 2L applied; sats 97% RR 14. Artelia LarocheM. Williams CNM made aware.

## 2013-05-18 NOTE — MAU Note (Signed)
Patient presents to MAU following in ablation in the office for pain control.

## 2013-05-18 NOTE — MAU Provider Note (Signed)
  History     CSN: [id number]  Arrival date and time: 05/18/13 1141   None     No chief complaint on file.  HPI This is a 43 y.o. female who presents from office for pain medication. She underwent an ablation in the office today and needs additional pain relief. She is currently writhing and crying out in pain.   RN Note: Patient presents to MAU following in ablation in the office for pain control.       OB History   Grav Para Term Preterm Abortions TAB SAB Ect Mult Living   2 2 2       2       No past medical history on file.  Past Surgical History  Procedure Laterality Date  . Breast enhancement surgery      Family History  Problem Relation Age of Onset  . Cancer Mother 2357    mesothelioma  . Sleep apnea Brother   . Cancer Maternal Grandmother     ? ovarian  . Diabetes Maternal Grandfather   . Alcohol abuse Other   . Depression Other   . Cancer Father [id number]    mesothelioma    History  Substance Use Topics  . Smoking status: Never Smoker   . Smokeless tobacco: Not on file  . Alcohol Use: Yes     Comment: rare    Allergies: No Known Allergies  Prescriptions prior to admission  Medication Sig Dispense Refill  . FLUoxetine (PROZAC) 40 MG capsule TAKE 1 CAPSULE (40 MG TOTAL) BY MOUTH DAILY.  30 capsule  3  . ibuprofen (ADVIL,MOTRIN) 800 MG tablet Take 800 mg by mouth every 8 (eight) hours as needed.        . meloxicam (MOBIC) 15 MG tablet Take 15 mg by mouth daily.      . Multiple Vitamin (MULTIVITAMIN) tablet Take 1 tablet by mouth daily.        Review of Systems  Constitutional: Negative for fever and chills.  Gastrointestinal: Positive for abdominal pain. Negative for nausea, vomiting, diarrhea and constipation.  Genitourinary: Negative for dysuria.  Neurological: Negative for dizziness.   Physical Exam   Blood pressure 139/106, pulse 94, temperature 97.2 F (36.2 C), temperature source Oral, resp. rate 20, SpO2 99.00%.  Physical Exam   Constitutional: She is oriented to person, place, and time. She appears well-developed and well-nourished. No distress.  HENT:  Head: Normocephalic.  Cardiovascular: Normal rate.   Respiratory: Effort normal.  GI: Soft. There is tenderness.  Genitourinary:  Deferred   Musculoskeletal: Normal range of motion.  Neurological: She is alert and oriented to person, place, and time.  Skin: Skin is warm and dry.  Psychiatric: She has a normal mood and affect.    MAU Course  Procedures  MDM Given Dilaudid and Phenergan IV. Dr Langston MaskerMorris updated  Assessment and Plan  Patient received good relief from meds Dr Langston MaskerMorris to follow.   Aviva SignsMarie L Ximena Todaro 05/18/2013, 12:07 PM

## 2013-05-18 NOTE — Progress Notes (Signed)
Patient was sent over from the office after inability to adequately control pain s/p Novasure in the office.  She received Percocet and Toradol with minimal relief.  The procedure was performed without complication.  VSS.    Abd: soft, ND.  Appropriately tender.  Pain dramatically improved after 1mg  dilaudid.  Patient was observed x 3 hours and stability maintained.    Plan:  -Dilaudid rx for home pain control -Zofran rx for nausea with meds -F/u in office per plan

## 2013-12-03 ENCOUNTER — Encounter (HOSPITAL_COMMUNITY): Payer: Self-pay | Admitting: *Deleted

## 2013-12-31 ENCOUNTER — Telehealth: Payer: Self-pay | Admitting: *Deleted

## 2013-12-31 NOTE — Telephone Encounter (Signed)
Patient left a message on voicemail that she needs a refill on Prozac. Last office visit 04/26/12. Is it okay to refill medication?

## 2014-01-01 MED ORDER — FLUOXETINE HCL 40 MG PO CAPS
40.0000 mg | ORAL_CAPSULE | Freq: Every day | ORAL | Status: DC | PRN
Start: 2014-01-01 — End: 2014-01-08

## 2014-01-01 NOTE — Telephone Encounter (Signed)
Lm on pts vm requesting a call back. Rx will only be filled once appt is scheduled, and will only be given enough to last until appt date

## 2014-01-01 NOTE — Telephone Encounter (Signed)
Ok to call in a few pills only until she can come in for office visit- it has been over a year since I have seen her.

## 2014-01-01 NOTE — Telephone Encounter (Signed)
Pt called and scheduled appointment for 01/08/14, [facility].  Pt declined to speak to CMA / lt

## 2014-01-08 ENCOUNTER — Ambulatory Visit (INDEPENDENT_AMBULATORY_CARE_PROVIDER_SITE_OTHER): Payer: BC Managed Care – PPO | Admitting: Family Medicine

## 2014-01-08 ENCOUNTER — Encounter: Payer: Self-pay | Admitting: Family Medicine

## 2014-01-08 VITALS — BP 114/66 | HR 58 | Temp 97.9°F | Wt 137.8 lb

## 2014-01-08 DIAGNOSIS — M545 Low back pain, unspecified: Secondary | ICD-10-CM | POA: Insufficient documentation

## 2014-01-08 DIAGNOSIS — N943 Premenstrual tension syndrome: Secondary | ICD-10-CM

## 2014-01-08 MED ORDER — FLUOXETINE HCL 40 MG PO CAPS: 40.0000 mg | ORAL_CAPSULE | Freq: Every day | ORAL | Status: DC | PRN

## 2014-01-08 MED ORDER — MELOXICAM 15 MG PO TABS: 15.0000 mg | ORAL_TABLET | Freq: Every day | ORAL | Status: DC | PRN

## 2014-01-08 NOTE — Assessment & Plan Note (Signed)
Improved with prn prozac. eRx refilled for one year. Call or return to clinic prn if these symptoms worsen or fail to improve as anticipated. The patient indicates understanding of these issues and agrees with the plan.

## 2014-01-08 NOTE — Assessment & Plan Note (Signed)
Stable- uses rare Meloxicam. Asks for refill today. eRx sent.

## 2014-01-08 NOTE — Progress Notes (Signed)
Subjective:   Patient ID: Comer LocketMarcy Traywick, female    DOB: 05/29/1970, 43 y.o.   MRN: [id number]  Comer LocketMarcy Pospisil is a pleasant 43 y.o. year old female whom I haven't seen in over a year for routine care,  presents to clinic today with Follow-up  on 01/08/2014  HPI: PMS- takes Prozac the week before and during her menstrual cycle. Does notice a huge difference when she does not take it. Had uterine ablation in 07/2013- bleeding much lighter but was a "traumatic process."  Had to be transferred to hospital for IV morphine.  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  Saw an orthopedist who told her that her back was "fine" per pt. Never has LE weakness or radiculopathy.  Current Outpatient Prescriptions on File Prior to Visit  Medication Sig Dispense Refill  . ibuprofen (ADVIL,MOTRIN) 800 MG tablet Take 800 mg by mouth every 8 (eight) hours as needed for moderate pain.      No current facility-administered medications on file prior to visit.    No Known Allergies  History reviewed. No pertinent past medical history.  Past Surgical History  Procedure Laterality Date  . Breast enhancement surgery      Family History  Problem Relation Age of Onset  . Cancer Mother 2757    mesothelioma  . Sleep apnea Brother   . Cancer Maternal Grandmother     ? ovarian  . Diabetes Maternal Grandfather   . Alcohol abuse Other   . Depression Other   . Cancer Father 4950    mesothelioma    History   Social History  . Marital Status: Married    Spouse Name: N/A    Number of Children: 2  . Years of Education: N/A   Occupational History  . 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 Smoking status: Never Smoker   . Smokeless tobacco: Not on file  . Alcohol Use: Yes     Comment: rare  . Drug Use: No  . Sexual Activity:    Partners: Male    Birth Control/ Protection: Rhythm   Other Topics Concern  . Not on  file   Social History Narrative   Gym 3-4 times a week   The PMH, PSH, Social History, Family History, Medications, and [facility], and have been updated if relevant.   Review of Systems  Constitutional: Negative.   HENT: Negative.   Musculoskeletal: Positive for back pain. Negative for arthralgias and gait problem.  Skin: Negative.   Hematological: Negative.   Psychiatric/Behavioral: Negative.   All other systems reviewed and are negative.      Objective:    BP 114/66 mmHg  Pulse 58  Temp(Src) 97.9 F (36.6 C) (Oral)  Wt 137 lb 12 oz (62.483 kg)  SpO2 97%  LMP 01/06/2014   Physical Exam  Constitutional: She is oriented to person, place, and time. She appears well-developed and well-nourished. No distress.  HENT:  Head: Normocephalic.  Cardiovascular: Normal rate.   Pulmonary/Chest: Effort normal and breath sounds normal.  Musculoskeletal: Normal range of motion.  Neurological: She is alert and oriented to person, place, and time.  Skin: Skin is warm and dry.  Psychiatric: She has a normal mood and affect. Her behavior is normal. Judgment and thought content normal.  Nursing note and vitals reviewed.         Assessment & Plan:  Premenstrual tension syndrome  Intermittent low back pain No Follow-up on file.

## 2014-01-08 NOTE — Patient Instructions (Signed)
Great to see you. I refilled your prozac.  [place]

## 2014-01-08 NOTE — Progress Notes (Signed)
Pre visit review using our clinic review tool, if applicable. No additional management support is needed unless otherwise documented below in the visit note. 

## 2014-02-04 ENCOUNTER — Telehealth: Payer: Self-pay

## 2014-02-04 NOTE — Telephone Encounter (Signed)
PLEASE NOTE: All timestamps contained within this report are represented as [place] Standard Time. CONFIDENTIALTY NOTICE: This fax transmission is intended only for the addressee. It contains information that is legally privileged, confidential or otherwise protected from use or disclosure. If you are not the intended recipient, you are strictly prohibited from reviewing, disclosing, copying using or disseminating any of this information or taking any action in reliance on or regarding this information. If you have received this fax in error, please notify us immediately by telephone so that we can arrange for its return to [place]. Phone: [phone], Toll-Free: [phone], Fax: [phone] Page: 1 of 2 Call Id: [id number] [facility] Day - Client TELEPHONE ADVICE RECORD [facility] Call Center Patient Name: Brittany Sanchez Gender: Female DOB: 09-Apr-1970 Age: 44 Y 2 M 3 D Return Phone Number: [phone] (Primary), [phone] (Secondary) Address: [address]/State/Zip: [place] [address] Client [facility] Day - Client Client Site [facility] - Day Physician Ruthe Mannan Contact Type Call Call Type Triage / Clinical Relationship To Patient Self Return Phone Number [phone] (Primary) Chief Complaint CHEST PAIN (>=21 years) - pain, pressure, heaviness or tightness Initial Comment caller states she is having chest pain PreDisposition Call Doctor Nurse Assessment Nurse: Anner Crete, RN, Olegario Messier Date/Time (Eastern Time): 02/04/2014 12:38:04 PM Confirm and document reason for call. If symptomatic, describe symptoms. ---Caller states she is having chest pain ( under her left breast, it comes and goes ) , is present now. She also has an increased SOB at times. Has the patient traveled out of the country within the last 30 days? ---Not Applicable Does the patient require triage? ---Yes Related visit to physician  within the last 2 weeks? ---No Does the PT have any chronic conditions? (i.e. diabetes, asthma, etc.) ---No Did the patient indicate they were pregnant? ---No Guidelines Guideline Title Affirmed Question Affirmed Notes Nurse Date/Time (Eastern Time) Chest Pain Recent long-distance travel with prolonged time in car, bus, plane, or train (i.e., within past 2 weeks; 6 or more hours duration) car travel 02/02/2014 Anner Crete, RN, Olegario Messier 02/04/2014 12:40:38 PM Disp. Time Lamount Cohen Time) Disposition Final User 02/04/2014 12:36:31 PM Send to Urgent Queue Lesia Sago 02/04/2014 12:43:59 PM Go to ED Now Yes Anner Crete, RN, Olegario Messier PLEASE NOTE: All timestamps contained within this report are represented as [place] Standard Time. CONFIDENTIALTY NOTICE: This fax transmission is intended only for the addressee. It contains information that is legally privileged, confidential or otherwise protected from use or disclosure. If you are not the intended recipient, you are strictly prohibited from reviewing, disclosing, copying using or disseminating any of this information or taking any action in reliance on or regarding this information. If you have received this fax in error, please notify us immediately by telephone so that we can arrange for its return to [place]. Phone: [phone], Toll-Free: [phone], Fax: [phone] Page: 2 of 2 Call Id: [id number] Caller Understands: Yes Disagree/Comply: Disagree Disagree/Comply Reason: Disagree with instructions Care Advice Given Per Guideline GO TO ED NOW: You need to be seen in the Emergency Department. Go to the ER at ___________ Hospital. Leave now. Drive carefully. DRIVING: Another adult should drive. CALL EMS IF: * Severe difficulty breathing occurs CARE ADVICE given per Chest Pain (Adult) guideline. After Care Instructions Given Call Event Type User Date / Time Description Referrals GO TO FACILITY REFUSED

## 2014-02-06 ENCOUNTER — Ambulatory Visit: Payer: BC Managed Care – PPO | Admitting: Family Medicine

## 2014-03-19 ENCOUNTER — Telehealth: Payer: Self-pay | Admitting: Family Medicine

## 2014-03-19 ENCOUNTER — Ambulatory Visit: Payer: Self-pay | Admitting: Internal Medicine

## 2014-03-19 NOTE — Telephone Encounter (Signed)
Pt has appt scheduled 03/21/14 with Nicki Reaperegina Baity NP.

## 2014-03-19 NOTE — Telephone Encounter (Signed)
PLEASE NOTE: All timestamps contained within this report are represented as [place] Standard Time. CONFIDENTIALTY NOTICE: This fax transmission is intended only for the addressee. It contains information that is legally privileged, confidential or otherwise protected from use or disclosure. If you are not the intended recipient, you are strictly prohibited from reviewing, disclosing, copying using or disseminating any of this information or taking any action in reliance on or regarding this information. If you have received this fax in error, please notify us immediately by telephone so that we can arrange for its return to us. Phone: [phone], Toll-Free: [phone], Fax: [phone] Page: 1 of 1 Call Id: [id number] [facility] Day - Client TELEPHONE ADVICE RECORD [facility] Patient Name: Brittany Sanchez DOB: 07/16/1970 Initial Comment Caller States tingling in hands for couple days Nurse Assessment Nurse: Kemper Durielarke, RN, Lurena Joinerebecca Date/Time (Eastern Time): 03/19/2014 12:36:26 PM Confirm and document reason for call. If symptomatic, describe symptoms. ---Caller States tingling in both hands for couple days, alternates and now mostly in right. Has the patient traveled out of the country within the last 30 days? ---Not Applicable Does the patient require triage? ---Yes Related visit to physician within the last 2 weeks? ---No Does the PT have any chronic conditions? (i.e. diabetes, asthma, etc.) ---No Did the patient indicate they were pregnant? ---No Guidelines Guideline Title Affirmed Question Affirmed Notes Neurologic Deficit [1] Tingling (e.g., pins and needles) of the face, arm or leg on one side of the body AND [2] present now Final Disposition User See Physician within 4 Hours (or PCP triage) Kemper Durielarke, RN, Lurena Joinerebecca

## 2014-03-20 ENCOUNTER — Telehealth: Payer: Self-pay | Admitting: Family Medicine

## 2014-03-20 ENCOUNTER — Other Ambulatory Visit: Payer: Self-pay | Admitting: Internal Medicine

## 2014-03-20 DIAGNOSIS — Z0000 Encounter for general adult medical examination without abnormal findings: Secondary | ICD-10-CM

## 2014-03-20 NOTE — Telephone Encounter (Signed)
Pt woulld like to have thyroid labs done when her cpe labs are done.  She would like to discuss results with you at her cpe. Thanks

## 2014-03-20 NOTE — Telephone Encounter (Signed)
Future labs ordered including TSH

## 2014-03-21 ENCOUNTER — Encounter: Payer: Self-pay | Admitting: Internal Medicine

## 2014-03-21 ENCOUNTER — Ambulatory Visit (INDEPENDENT_AMBULATORY_CARE_PROVIDER_SITE_OTHER): Payer: BC Managed Care – PPO | Admitting: Internal Medicine

## 2014-03-21 VITALS — BP 108/68 | HR 54 | Temp 97.9°F | Wt 137.0 lb

## 2014-03-21 DIAGNOSIS — R202 Paresthesia of skin: Secondary | ICD-10-CM

## 2014-03-21 DIAGNOSIS — Z13220 Encounter for screening for lipoid disorders: Secondary | ICD-10-CM

## 2014-03-21 NOTE — Progress Notes (Signed)
Pre visit review using our clinic review tool, if applicable. No additional management support is needed unless otherwise documented below in the visit note. 

## 2014-03-21 NOTE — Progress Notes (Signed)
Subjective:    Patient ID: Brittany Sanchez, female    DOB: 09/29/1970, 44 y.o.   MRN: [id number]  HPI  Pt presents to the clinic today with c/o tingling in her hands and feet. She reports this started 1 week ago. It has been intermittent. It seems worse when it is cold outside. Nothing seems to make it better. She has not had any changes in her diet or medications. She has no history of diabetes. She has not tried anything OTC.  Review of Systems      History reviewed. No pertinent past medical history.  Current Outpatient Prescriptions  Medication Sig Dispense Refill  . FLUoxetine (PROZAC) 40 MG capsule Take 1 capsule (40 mg total) by mouth daily as needed (for pms). 90 capsule 3  . ibuprofen (ADVIL,MOTRIN) 800 MG tablet Take 800 mg by mouth every 8 (eight) hours as needed for moderate pain.     . meloxicam (MOBIC) 15 MG tablet Take 1 tablet (15 mg total) by mouth daily as needed for pain. 30 tablet 0   No current facility-administered medications for this visit.    No Known Allergies  Family History  Problem Relation Age of Onset  . Cancer Mother 7357    mesothelioma  . Sleep apnea Brother   . Cancer Maternal Grandmother     ? ovarian  . Diabetes Maternal Grandfather   . Alcohol abuse Other   . Depression Other   . Cancer Father 5050    mesothelioma    History   Social History  . Marital Status: Married    Spouse Name: N/A  . Number of Children: 2  . Years of Education: N/A   Occupational History  . 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 Smoking status: Never Smoker   . Smokeless tobacco: Not on file  . Alcohol Use: 0.0 oz/week    0 Standard drinks or equivalent per week     Comment: rare  . Drug Use: No  . Sexual Activity:    Partners: Male    Birth Control/ Protection: Rhythm   Other Topics Concern  . Not on file   Social History Narrative   Gym 3-4 times a week     Constitutional: Denies fever, malaise, fatigue,  headache or abrupt weight changes.  Musculoskeletal: Denies decrease in range of motion, difficulty with gait, muscle pain or joint pain and swelling.  Skin: Denies redness, rashes, lesions or ulcercations.  Neurological: Pt reports paraesthesias in hands and feet. Denies dizziness, difficulty with memory, difficulty with speech or problems with balance and coordination.   No other specific complaints in a complete review of systems (except as listed in HPI above).  Objective:   Physical Exam   BP 108/68 mmHg  Pulse 54  Temp(Src) 97.9 F (36.6 C) (Oral)  Wt 137 lb (62.143 kg)  SpO2 99% Wt Readings from Last 3 Encounters:  03/21/14 137 lb (62.143 kg)  01/08/14 137 lb 12 oz (62.483 kg)  05/18/13 135 lb (61.236 kg)    General: Appears her stated age, well developed, well nourished in NAD. Skin: Warm, dry and intact. No rashes, lesions or ulcerations noted. Cardiovascular: Bradycardic with normal rhythm. S1,S2 noted.  No murmur, rubs or gallops noted. Radial pulses and pedal pulses 2+. Cap refill < 3 sec. Pulmonary/Chest: Normal effort and positive vesicular breath sounds. No respiratory distress. No wheezes, rales or ronchi noted.  Musculoskeletal:  No signs of joint swelling. No difficulty  with gait.  Neurological: Alert and oriented. Sensation intact to bilateral hands and feet. Negative Phalen's. Negative Tinel.  BMET    Component Value Date/Time   NA 138 03/16/2013 1134   K 4.1 03/16/2013 1134   CL 101 03/16/2013 1134   CO2 30 03/16/2013 1134   GLUCOSE 89 03/16/2013 1134   BUN 16 03/16/2013 1134   CREATININE 1.05 03/16/2013 1134   CREATININE 1.0 11/01/2011 0830   CALCIUM 9.7 03/16/2013 1134    Lipid Panel     Component Value Date/Time   CHOL 138 03/16/2013 1134   TRIG 76 03/16/2013 1134   HDL 52 03/16/2013 1134   CHOLHDL 2.7 03/16/2013 1134   VLDL 15 03/16/2013 1134   LDLCALC 71 03/16/2013 1134    CBC    Component Value Date/Time   WBC 7.3 03/16/2013 1134    RBC 4.40 03/16/2013 1134   HGB 13.4 03/16/2013 1134   HCT 39.1 03/16/2013 1134   PLT 210 03/16/2013 1134   MCV 88.9 03/16/2013 1134   MCH 30.5 03/16/2013 1134   MCHC 34.3 03/16/2013 1134   RDW 13.4 03/16/2013 1134    Hgb A1C No results found for: HGBA1C      Assessment & Plan:   Paresthesias:  Will check CBC, CMET, TSH, Vit D and B12 today If labs normal, consider referral to neurology for nerve conduction study  Will follow up with you after labs are back, RTC as needed

## 2014-03-21 NOTE — Patient Instructions (Signed)

## 2014-03-22 ENCOUNTER — Telehealth: Payer: Self-pay | Admitting: Family Medicine

## 2014-03-22 LAB — LIPID PANEL
CHOL/HDL RATIO: 3
Cholesterol: 149 mg/dL (ref 0–200)
HDL: 51.9 mg/dL (ref >39.00)
LDL Cholesterol: 81 mg/dL (ref 0–99)
NONHDL: 97.1
TRIGLYCERIDES: 82 mg/dL (ref 0.0–149.0)
VLDL: 16.4 mg/dL (ref 0.0–40.0)

## 2014-03-22 LAB — CBC
HCT: 38.4 % (ref 36.0–46.0)
Hemoglobin: 13.1 g/dL (ref 12.0–15.0)
MCHC: 34 g/dL (ref 30.0–36.0)
MCV: 91.6 fl (ref 78.0–100.0)
Platelets: 211 10*3/uL (ref 150.0–400.0)
RBC: 4.19 Mil/uL (ref 3.87–5.11)
RDW: 13.1 % (ref 11.5–15.5)
WBC: 8.6 10*3/uL (ref 4.0–10.5)

## 2014-03-22 LAB — COMPREHENSIVE METABOLIC PANEL
ALT: 18 U/L (ref 0–35)
AST: 27 U/L (ref 0–37)
Albumin: 4.3 g/dL (ref 3.5–5.2)
Alkaline Phosphatase: 34 U/L — ABNORMAL LOW (ref 39–117)
BILIRUBIN TOTAL: 0.4 mg/dL (ref 0.2–1.2)
BUN: 25 mg/dL — ABNORMAL HIGH (ref 6–23)
CO2: 27 meq/L (ref 19–32)
Calcium: 9.5 mg/dL (ref 8.4–10.5)
Chloride: 103 mEq/L (ref 96–112)
Creatinine, Ser: 1.13 mg/dL (ref 0.40–1.20)
GFR: 55.52 mL/min — ABNORMAL LOW (ref >60.00)
Glucose, Bld: 77 mg/dL (ref 70–99)
Potassium: 4.2 mEq/L (ref 3.5–5.1)
SODIUM: 137 meq/L (ref 135–145)
TOTAL PROTEIN: 6.8 g/dL (ref 6.0–8.3)

## 2014-03-22 LAB — TSH: TSH: 2.9 u[IU]/mL (ref 0.35–4.50)

## 2014-03-22 LAB — VITAMIN D 25 HYDROXY (VIT D DEFICIENCY, FRACTURES): VITD: 25.64 ng/mL — ABNORMAL LOW (ref 30.00–100.00)

## 2014-03-22 LAB — VITAMIN B12: VITAMIN B 12: 387 pg/mL (ref 211–911)

## 2014-03-22 NOTE — Telephone Encounter (Signed)
Patient is asking for a return call with her lab results.

## 2014-03-22 NOTE — Telephone Encounter (Signed)
After looking in chart, it looks like Rene KocherRegina is leaving the labs to [name] as pt has upcoming appt with [name]

## 2014-03-22 NOTE — Telephone Encounter (Signed)
Pt called checking on lab results  that was done yesterday

## 2014-03-22 NOTE — Telephone Encounter (Signed)
Overall, labs look good but I do want to discuss with her- b12 is a little low which can cause numbness in hands.  Also, Vit D a little low.  Until her appt, start taking Vit B12 1000 mcg daily and Vit D 2000 IU daily.

## 2014-03-22 NOTE — Telephone Encounter (Signed)
Spoke to pt and advised her labs were resulted but were noted to be discussed at her upcoming OV. Pt requested that I look at them to see if there were any abnormal labs as she has been experiencing numbness in her hands. I advised her that I am not a dr, but based on what I see alone, everything was ok, and that there were no annotations that anything was to be relayed to her. Offered to send them to [name] for annotations just to confirm; pt is wanting to proceed. pls advise

## 2014-03-25 ENCOUNTER — Telehealth: Payer: Self-pay | Admitting: Family Medicine

## 2014-03-25 NOTE — Telephone Encounter (Signed)
See additional phone note. 

## 2014-03-25 NOTE — Telephone Encounter (Signed)
Spoke to pt and informed her of results and instruction; verbally expressed understanding.

## 2014-03-25 NOTE — Telephone Encounter (Signed)
Lm on pts vm requesting a call back 

## 2014-03-25 NOTE — Telephone Encounter (Signed)
Pt returned your call.  

## 2014-03-26 ENCOUNTER — Telehealth: Payer: Self-pay | Admitting: Family Medicine

## 2014-03-26 NOTE — Telephone Encounter (Signed)
Please call to check on pt. 

## 2014-03-26 NOTE — Telephone Encounter (Signed)
Patient Name: Comer LocketMARCY Raby DOB: 07/25/1970 Initial Comment Caller states she has been seen for tingling in her hands and feet, and she has been having chest pains back in December and she is on aspirin. She is having this dull pain in chest today and the past 3 nights. Nurse Assessment Nurse: Charna Elizabethrumbull, RN, Cathy Date/Time (Eastern Time): 03/26/2014 9:42:41 AM Confirm and document reason for call. If symptomatic, describe symptoms. ---Caller states she developed chest pain again 3 nights ago. No severe breathing difficulty. Alert and responsive. No injury in the past 3 days. Has the patient traveled out of the country within the last 30 days? ---No Does the patient require triage? ---Yes Related visit to physician within the last 2 weeks? ---No Does the PT have any chronic conditions? (i.e. diabetes, asthma, etc.) ---Yes List chronic conditions. ---Vitamin B and D Deficiency, PMS Did the patient indicate they were pregnant? ---No Guidelines Guideline Title Affirmed Question Affirmed Notes Chest Pain Pain also present in shoulder(s) or arm(s) or jaw (Exception: pain is clearly made worse by movement) jaw pain Final Disposition User Go to ED Now Charna Elizabethrumbull, RN, Truitt MerleCathy Jalayne is not sure which Er she will go to.

## 2014-03-26 NOTE — Telephone Encounter (Signed)
Pt called to get a copy of lab results. She had appointment with cardiology 2/24

## 2014-03-26 NOTE — Telephone Encounter (Signed)
Spoke to pt who states that she is "ok" and has an appt with cardiology on 03/27/14

## 2014-03-27 NOTE — Telephone Encounter (Signed)
Lm on pts vm and informed her labs have been printed and are available for pickup from the front desk

## 2014-04-03 ENCOUNTER — Ambulatory Visit (INDEPENDENT_AMBULATORY_CARE_PROVIDER_SITE_OTHER): Payer: BC Managed Care – PPO | Admitting: Family Medicine

## 2014-04-03 ENCOUNTER — Other Ambulatory Visit: Payer: BC Managed Care – PPO

## 2014-04-03 ENCOUNTER — Encounter: Payer: Self-pay | Admitting: Family Medicine

## 2014-04-03 VITALS — BP 110/60 | HR 68 | Temp 98.3°F | Wt 138.5 lb

## 2014-04-03 DIAGNOSIS — G4700 Insomnia, unspecified: Secondary | ICD-10-CM

## 2014-04-03 DIAGNOSIS — E559 Vitamin D deficiency, unspecified: Secondary | ICD-10-CM

## 2014-04-03 DIAGNOSIS — R202 Paresthesia of skin: Secondary | ICD-10-CM | POA: Insufficient documentation

## 2014-04-03 DIAGNOSIS — E538 Deficiency of other specified B group vitamins: Secondary | ICD-10-CM

## 2014-04-03 MED ORDER — DIAZEPAM 5 MG PO TABS: 5.0000 mg | ORAL_TABLET | Freq: Two times a day (BID) | ORAL | Status: DC | PRN

## 2014-04-03 MED ORDER — VITAMIN D (ERGOCALCIFEROL) 1.25 MG (50000 UNIT) PO CAPS: 50000.0000 [IU] | ORAL_CAPSULE | ORAL | Status: DC

## 2014-04-03 NOTE — Patient Instructions (Signed)
Good to see you. We are referring you to Dr. Arbutus Leasat (neurologist).  Please stop by to see Brittany Sanchez on your way out. We are also starting high dose Vitamin D and valium as needed for sleep and anxiety.

## 2014-04-03 NOTE — Progress Notes (Signed)
Subjective:   Patient ID: Brittany Sanchez, female    DOB: 11/07/1970, 44 y.o.   MRN: [id number]  Brittany Sanchez is a pleasant 44 y.o. year old female who presents to clinic today with Tingling  on 04/03/2014  HPI: Saw Brittany Reaperegina Baity, NP for this complaint on 2/18- note reviewed.  Labs were done and she was advised to come to office to talk with me about them today. B12 low end of normal and Vit D low. Lab Results  Component Value Date   VITAMINB12 387 03/21/2014   GFR mildly decreased.  CBC, TSH, CMET otherwise unremarkable. Lab Results  Component Value Date   WBC 8.6 03/21/2014   HGB 13.1 03/21/2014   HCT 38.4 03/21/2014   MCV 91.6 03/21/2014   PLT 211.0 03/21/2014   Lab Results  Component Value Date   TSH 2.90 03/21/2014   Lab Results  Component Value Date   ALT 18 03/21/2014   AST 27 03/21/2014   ALKPHOS 34* 03/21/2014   BILITOT 0.4 03/21/2014   Lab Results  Component Value Date   CREATININE 1.13 03/21/2014   Symptoms continue to progress since she saw Brittany Sanchez.  Started the evening of February `11th- bilateral hand and finger tingling/numbness, worse at night.  Then progressed to similar symptoms in her legs and neck.  Last night, symptoms occurred in her head.  Cannot sleep because symptoms are so severe at night.  Having left sided chest pain.  Saw cardiology, Brittany Sanchez, per pt who ruled out cardiac issue.  Take Zumba class 5 nights per week and has not noticed any muscle weakness or changes in coordination.  No known FH of MS or other neuromuscular disorders.  No new rxs.  No new stressors.  Current Outpatient Prescriptions on File Prior to Visit  Medication Sig Dispense Refill  . ibuprofen (ADVIL,MOTRIN) 800 MG tablet Take 800 mg by mouth every 8 (eight) hours as needed for moderate pain.     . meloxicam (MOBIC) 15 MG tablet Take 1 tablet (15 mg total) by mouth daily as needed for pain. (Patient not taking: Reported on 04/03/2014) 30 tablet 0   No current  facility-administered medications on file prior to visit.    No Known Allergies  History reviewed. No pertinent past medical history.  Past Surgical History  Procedure Laterality Date  . Breast enhancement surgery      Family History  Problem Relation Age of Onset  . Cancer Mother 2657    mesothelioma  . Sleep apnea Brother   . Cancer Maternal Grandmother     ? ovarian  . Diabetes Maternal Grandfather   . Alcohol abuse Other   . Depression Other   . Cancer Father [id number]    mesothelioma    History   Social History  . Marital Status: Married    Spouse Name: N/A  . Number of Children: 2  . Years of Education: N/A   Occupational History  . 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 Smoking status: Never Smoker   . Smokeless tobacco: Not on file  . Alcohol Use: 0.0 oz/week    0 Standard drinks or equivalent per week     Comment: rare  . Drug Use: No  . Sexual Activity:    Partners: Male    Birth Control/ Protection: Rhythm   Other Topics Concern  . Not on file   Social History Narrative   Gym 3-4 times a week   The  PMH, PSH, Social History, Family History, Medications, and allergies have been reviewed in [facility], and have been updated if relevant.  Review of Systems  Constitutional: Positive for fatigue. Negative for unexpected weight change.  Eyes: Negative.   Respiratory: Negative.   Cardiovascular: Positive for chest pain. Negative for palpitations and leg swelling.  Gastrointestinal: Negative.   Endocrine: Negative.   Musculoskeletal: Negative for gait problem.  Skin: Negative.   Neurological: Positive for numbness and headaches. Negative for dizziness, tremors, seizures, syncope, facial asymmetry, speech difficulty, weakness and light-headedness.  Hematological: Negative.   Psychiatric/Behavioral: Positive for sleep disturbance. Negative for hallucinations and dysphoric mood. The patient is not nervous/anxious.   All other systems  reviewed and are negative.      Objective:    BP 110/60 mmHg  Pulse 68  Temp(Src) 98.3 F (36.8 C) (Oral)  Wt 138 lb 8 oz (62.823 kg)  SpO2 98% Wt Readings from Last 3 Encounters:  04/03/14 138 lb 8 oz (62.823 kg)  03/21/14 137 lb (62.143 kg)  01/08/14 137 lb 12 oz (62.483 kg)     Physical Exam  Constitutional: She appears well-developed and well-nourished. No distress.  HENT:  Head: Normocephalic and atraumatic.  Eyes: Conjunctivae are normal.  Neck: Normal range of motion.  Cardiovascular: Normal rate.   Pulmonary/Chest: Effort normal.  Musculoskeletal: Normal range of motion.  Neurological: She is alert. She has normal strength and normal reflexes. No cranial nerve deficit or sensory deficit. Coordination and gait normal.  Skin: Skin is warm and dry.  Psychiatric: She has a normal mood and affect. Her behavior is normal. Judgment and thought content normal.  Nursing note and vitals reviewed.         Assessment & Plan:   Paresthesia No Follow-up on file.

## 2014-04-03 NOTE — Progress Notes (Signed)
Pre visit review using our clinic review tool, if applicable. No additional management support is needed unless otherwise documented below in the visit note. 

## 2014-04-03 NOTE — Assessment & Plan Note (Signed)
New- progressive. Complicated/complex constellation of symptoms. Initially, symptoms could have been consistent with carpal tunnel syndrome- bilateral hands, nocturnal, but now that her legs, head and neck are involved, this is no longer consistent with this. Vit B12 low end of normal but I would not expect this would cause such extreme symptoms.  She recently started taking 5000 mcg of Vit B12 daily.  Advised not to increase this dose.  Will also replete her Vit D with high dose Vit D- eRx sent x 7 weeks.    Given rx for valium after discussing sedation and addiction potential.  Hopefully this will help with sleep and increased anxiety concerning these symptoms.  Refer to neuro, Dr. Arbutus Leasat, for further work up.  ? EMG The patient indicates understanding of these issues and agrees with the plan.

## 2014-04-09 ENCOUNTER — Other Ambulatory Visit: Payer: BC Managed Care – PPO

## 2014-04-10 ENCOUNTER — Encounter: Payer: BC Managed Care – PPO | Admitting: Family Medicine

## 2014-04-12 ENCOUNTER — Encounter: Payer: Self-pay | Admitting: Neurology

## 2014-04-12 ENCOUNTER — Ambulatory Visit (INDEPENDENT_AMBULATORY_CARE_PROVIDER_SITE_OTHER): Payer: BC Managed Care – PPO | Admitting: Neurology

## 2014-04-12 VITALS — BP 90/64 | HR 66 | Ht 62.5 in | Wt 140.1 lb

## 2014-04-12 DIAGNOSIS — R209 Unspecified disturbances of skin sensation: Secondary | ICD-10-CM

## 2014-04-12 DIAGNOSIS — F419 Anxiety disorder, unspecified: Secondary | ICD-10-CM

## 2014-04-12 DIAGNOSIS — R202 Paresthesia of skin: Secondary | ICD-10-CM

## 2014-04-12 MED ORDER — NORTRIPTYLINE HCL 10 MG PO CAPS: ORAL_CAPSULE | ORAL | Status: DC

## 2014-04-12 NOTE — Progress Notes (Signed)
[facility] Clinic Note - Initial Visit   Date: 04/12/2014   Brittany Sanchez MRN: [id number] DOB: 06/07/1970   Dear Dr. Dayton MartesAron:  Thank you for your kind referral of Brittany Sanchez for consultation of generalized paresthesias. Although her history is well known to you, please allow us to reiterate it for the purpose of our medical record. The patient was accompanied to the clinic by self.    History of Present Illness: Brittany Sanchez is a 44 y.o. left-handed Caucasian female with PMS presenting for evaluation of generalized hand and feet paresthesias.    Starting around early February 2016, she developed tingling sensation of the medial aspect of the hands which progressed it involve the sole of the feet with occasional radiation into the lower leg and forearm.  Tingling sensation is always present, but worse at night time.  As soon as she tries to relax, symptoms worsen but if she tries to walk around, it helps.  She also has tingling of the neck and head.  She denies any weakness or falls.  Denies the urge to want to move her arms or legs.    She feels that she is emotionally on a roller coaster.  She is also having left sided chest pain and saw cardiology whose evaluation was normal.  She saw a chiropractor for the same symptoms who did adjustments, but there was no change in paresthesias.    She saw Dr. Dayton MartesAron for the above symptoms and had routine blood work which showed borderline-normal vitamin B12 and vitamin D deficiency.  She was also started on valium which helps her relax and sleep at night as well as makes her comfortable.  She usually wakes up around 3am with paresthesias again.  She is very active and exercises 3-4 times per week, teaches zumba, works as a Education officer, museummiddle school teacher, and is a mother of three so keeps very busy.  She denies any double vision or new vision changes.  She complains of memory problems, for instance she finds it difficult to  remember her children's birthday or when completing a form she forgot where she graduated high school.  She always feels stressed and is always busy, but denies any new stressors.  Out-side paper records, electronic medical record, and images have been reviewed where available and summarized as:  Labs 03/21/2014:  Vitamin B12 387, TSH 2.90, vitamin D 25*    Past Medical History  Diagnosis Date  . PMS (premenstrual syndrome)     Past Surgical History  Procedure Laterality Date  . Breast enhancement surgery       Medications:  Current Outpatient Prescriptions on File Prior to Visit  Medication Sig Dispense Refill  . diazepam (VALIUM) 5 MG tablet Take 1 tablet (5 mg total) by mouth every 12 (twelve) hours as needed for anxiety (or insomnia). 30 tablet 1  . ibuprofen (ADVIL,MOTRIN) 800 MG tablet Take 800 mg by mouth every 8 (eight) hours as needed for moderate pain.     . meloxicam (MOBIC) 15 MG tablet Take 1 tablet (15 mg total) by mouth daily as needed for pain. 30 tablet 0  . Vitamin D, Ergocalciferol, (DRISDOL) 50000 UNITS CAPS capsule Take 1 capsule (50,000 Units total) by mouth every 7 (seven) days. 7 capsule 0   No current facility-administered medications on file prior to visit.    Allergies: No Known Allergies  Family History: Family History  Problem Relation Age of Onset  . Cancer Mother 1757    mesothelioma  .  Sleep apnea Brother   . Cancer Maternal Grandmother     ? ovarian  . Diabetes Maternal Grandfather   . Alcohol abuse Other   . Depression Other   . Cancer Father 61    mesothelioma    Social History: History   Social History  . Marital Status: Married    Spouse Name: N/A  . Number of Children: 2  . Years of Education: N/A   Occupational History  . 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 Smoking status: Never Smoker   . Smokeless tobacco: Not on file  . Alcohol Use: 0.0 oz/week    0 Standard drinks or equivalent per  week     Comment: rare  . Drug Use: No  . Sexual Activity:    Partners: Male    Birth Control/ Protection: Rhythm   Other Topics Concern  . Not on file   Social History Narrative   Gym 3-4 times a week.     Lives with husband in a two story home.  Has 2 children.    Teaches middle school, ESL.     Highest level of education:  Masters    Review of Systems:  CONSTITUTIONAL: No fevers, chills, night sweats, or weight loss.   EYES: No visual changes or eye pain ENT: No hearing changes.  No history of nose bleeds.   RESPIRATORY: No cough, wheezing and shortness of breath.   CARDIOVASCULAR: Negative for chest pain, and palpitations.   GI: Negative for abdominal discomfort, blood in stools or black stools.  No recent change in bowel habits.   GU:  No history of incontinence.   MUSCLOSKELETAL: No history of joint pain or swelling.  No myalgias.   SKIN: Negative for lesions, rash, and itching.   HEMATOLOGY/ONCOLOGY: Negative for prolonged bleeding, bruising easily, and swollen nodes.  No history of cancer.   ENDOCRINE: Negative for cold or heat intolerance, polydipsia or goiter.   PSYCH:  No depression or anxiety symptoms.   NEURO: As Above.   Vital Signs:  BP 90/64 mmHg  Pulse 66  Ht 5' 2.5" (1.588 m)  Wt 140 lb 1 oz (63.532 kg)  BMI 25.19 kg/m2  SpO2 98%  General Medical Exam:   General:  Well appearing, comfortable.   Eyes/ENT: see cranial nerve examination.   Neck: No masses appreciated.  Full range of motion without tenderness.  No carotid bruits. Respiratory:  Clear to auscultation, good air entry bilaterally.   Cardiac:  Regular rate and rhythm, no murmur.   Extremities:  No deformities, edema, or skin discoloration.  Skin:  No rashes or lesions.  Neurological Exam: MENTAL STATUS including orientation to time, place, person, recent and remote memory, attention span and concentration, language, and fund of knowledge is normal.  Speech is not dysarthric.  Montreal  Cognitive Assessment  04/12/2014  Visuospatial/ Executive (0/5) 5  Naming (0/3) 3  Attention: Read list of digits (0/2) 2  Attention: Read list of letters (0/1) 1  Attention: Serial 7 subtraction starting at 100 (0/3) 3  Language: Repeat phrase (0/2) 2  Language : Fluency (0/1) 1  Abstraction (0/2) 2  Delayed Recall (0/5) 2  Orientation (0/6) 6  Total 27  Adjusted Score (based on education) 27   CRANIAL NERVES: II:  No visual field defects.  Unremarkable fundi.   III-IV-VI: Pupils equal round and reactive to light.  Normal conjugate, extra-ocular eye movements in all directions of gaze.  No nystagmus.  No ptosis.   V:  Normal facial sensation.   VII:  Normal facial symmetry and movements.  No pathologic facial reflexes.  VIII:  Normal hearing and vestibular function.   IX-X:  Normal palatal movement.   XI:  Normal shoulder shrug and head rotation.   XII:  Normal tongue strength and range of motion, no deviation or fasciculation.  MOTOR:  No atrophy, fasciculations or abnormal movements.  No pronator drift.  Tone is normal.    Right Upper Extremity:    Left Upper Extremity:    Deltoid  5/5   Deltoid  5/5   Biceps  5/5   Biceps  5/5   Triceps  5/5   Triceps  5/5   Wrist extensors  5/5   Wrist extensors  5/5   Wrist flexors  5/5   Wrist flexors  5/5   Finger extensors  5/5   Finger extensors  5/5   Finger flexors  5/5   Finger flexors  5/5   Dorsal interossei  5/5   Dorsal interossei  5/5   Abductor pollicis  5/5   Abductor pollicis  5/5   Tone (Ashworth scale)  0  Tone (Ashworth scale)  0   Right Lower Extremity:    Left Lower Extremity:    Hip flexors  5/5   Hip flexors  5/5   Hip extensors  5/5   Hip extensors  5/5   Knee flexors  5/5   Knee flexors  5/5   Knee extensors  5/5   Knee extensors  5/5   Dorsiflexors  5/5   Dorsiflexors  5/5   Plantarflexors  5/5   Plantarflexors  5/5   Toe extensors  5/5   Toe extensors  5/5   Toe flexors  5/5   Toe flexors  5/5   Tone  (Ashworth scale)  0  Tone (Ashworth scale)  0   MSRs:  Right                                                                 Left brachioradialis 2+  brachioradialis 2+  biceps 2+  biceps 2+  triceps 2+  triceps 2+  patellar 2+  patellar 2+  ankle jerk 2+  ankle jerk 2+  Hoffman no  Hoffman no  plantar response down  plantar response down   SENSORY:  Normal and symmetric perception of light touch, pinprick, vibration, and proprioception.  Romberg's sign absent.   COORDINATION/GAIT: Normal finger-to- nose-finger and heel-to-shin.  Intact rapid alternating movements bilaterally.  Able to rise from a chair without using arms.  Gait narrow based and stable. Tandem and stressed gait intact.    IMPRESSION: Mrs. Foote is a 44 year-old female presenting for evaluation of bilateral hand and feet paresthesias, starting in February 2016.  Her exam is entirely normal and non-focal, making it difficult to localize her symptoms. Cognitive testing showed mild difficulty with recall, but otherwise normal assessment. I recommended doing electrodiagnostic testing to better characterize the nature of her symptoms, but she would like to think about it.  With the patchy nature of her symptoms, another option is to get MRI brain to evaluate for demyelinating disease.  With the absence of upper motor neuron findings, my suspicion for primary CNS  pathology is low.  She is somewhat frustrated at the lack of answers and persistence of symptoms, so I offered nortriptyline and discouraged her from taking valium each night.  If her work-up return normal, anxiety or stress reaction manifesting with neurological symptoms would need to be considered especially since symptoms resolve when taking valium.  Let's first see what the work-up shows.  I agree with supplementation for vitamin B12 and vitamin D.   PLAN/RECOMMENDATIONS:  1.  Start nortriptyline 10mg  at bedtime at bedtime for one week, then increase to 2 tablets at  bedtime. Risks and benefits discussed.  Of note, she is also on prozac 40mg  which she takes for PMS and I recommended that she hold on taking this for now to minimize medication interaction 2.  EMG of the right arm and leg 3.  MRI brain wwo contrast 4.  I agree with supplementation for vitamin B12 and vitamin D. 5.  Return to clinic in 3 months   The duration of this appointment visit was 50 minutes of face-to-face time with the patient.  Greater than 50% of this time was spent in counseling, explanation of diagnosis, planning of further management, and coordination of care.   Thank you for allowing me to participate in patient's care.  If I can answer any additional questions, I would be pleased to do so.    Sincerely,    Venus Ruhe K. Allena Katz, DO

## 2014-04-12 NOTE — Patient Instructions (Addendum)
1.  EMG of the right arm and leg in April 2.  MRI brain wwo contrast  3.  Start nortriptyline 10mg  at bedtime x 1 week, then increase to 2 tablets 4.  Stop valium 5.  Return to clinic in 3 months

## 2014-04-15 ENCOUNTER — Telehealth: Payer: Self-pay | Admitting: Neurology

## 2014-04-15 NOTE — Telephone Encounter (Signed)
Pt called wanting to f/u on an EMG is is suppose to be scheduled for.  Please call pt # [phone]

## 2014-04-15 NOTE — Telephone Encounter (Signed)
Patient called back to schedule her EMG.  She said that Dr. Allena KatzPatel wants her to have it before her MRI on March 23.

## 2014-04-15 NOTE — Telephone Encounter (Signed)
Pt called wanting to f/u on an EMG she is suppose to have. Please call pt # [phone]

## 2014-04-19 NOTE — Telephone Encounter (Signed)
See previous documentation? Has this been taken care of? Is encounter ready to be closed?  Please follow up / Sherri S.

## 2014-04-22 ENCOUNTER — Ambulatory Visit (INDEPENDENT_AMBULATORY_CARE_PROVIDER_SITE_OTHER): Payer: BC Managed Care – PPO | Admitting: Neurology

## 2014-04-22 DIAGNOSIS — F419 Anxiety disorder, unspecified: Secondary | ICD-10-CM

## 2014-04-22 DIAGNOSIS — R202 Paresthesia of skin: Secondary | ICD-10-CM

## 2014-04-22 DIAGNOSIS — R209 Unspecified disturbances of skin sensation: Secondary | ICD-10-CM

## 2014-04-22 NOTE — Procedures (Signed)
[facility]  [address], [address]  [place], [place] [address] Tel: [phone] Fax:  [phone] Test Date:  04/22/2014  Patient: Brittany Sanchez DOB: 08/26/1970 Physician: Nita Sickleonika Analea Muller, DO  Sex: Female Height: 5' 2.5" Ref Phys: Nita Sickleatel, Conda Wannamaker  ID#: [id number] Temp: 34.0C Technician: Ala BentSusan Reid R. NCS T.   Patient Complaints: Patient is a 44 year old female here for evaluation of bilateral hands and feet paresthesias.  NCV & EMG Findings: Extensive electrodiagnostic testing of the right upper and lower extremity shows:  1. Right median, ulnar, radial, and palmar sensory responses are within normal limits. 2. Right median and ulnar motor responses are within normal limits. 3. Right sural and superficial peroneal sensory responses are within normal limits. 4. Right tibial and peroneal motor responses are within normal limits. 5. There is no evidence of active or chronic motor axon loss changes affecting any of the tested muscles. Motor unit configuration and recruitment pattern is within normal limits.   Impression: This is a normal study of the right upper and lower extremities.  In particular, there is no evidence of a generalized sensorimotor polyneuropathy, carpal tunnel syndrome, cervical/lumbosacral radiculopathy affecting the right side.   ___________________________ Nita Sickleonika Mclane Arora, DO    Nerve Conduction Studies Anti Sensory Summary Table   Site NR Peak (ms) Norm Peak (ms) P-T Amp (V) Norm P-T Amp  Right Median Anti Sensory (2nd Digit)  34C  Wrist    3.0 <3.4 37.8 >20  Right Radial Anti Sensory (Base 1st Digit)  34C  Wrist    2.2 <2.7 30.9 >18  Right Sup Peroneal Anti Sensory (Ant Lat Mall)  34C  12 cm    2.5 <4.5 16.0 >5  Right Sural Anti Sensory (Lat Mall)  34C  Calf    3.1 <4.5 8.7 >5  Right Ulnar Anti Sensory (5th Digit)  34C  Wrist    2.5 <3.1 25.0 >12   Motor Summary Table   Site NR Onset (ms) Norm Onset (ms) O-P Amp (mV) Norm O-P Amp  Site1 Site2 Delta-0 (ms) Dist (cm) Vel (m/s) Norm Vel (m/s)  Right Median Motor (Abd Poll Brev)  34C  Wrist    2.9 <3.9 11.8 >6 Elbow Wrist 4.6 27.0 59 >50  Elbow    7.5  11.7         Right Peroneal Motor (Ext Dig Brev)  34C    unable to tolerate stim  Ankle    4.3 <5.5 5.3 >3 B Fib Ankle 6.2 29.0 47 >40  B Fib    10.5  4.8  Poplt B Fib 1.6 9.0 56 >40  Poplt    12.1  4.5         Right Tibial Motor (Abd Hall Brev)  34C  Ankle    3.1 <6.0 15.0 >8 Knee Ankle 7.5 38.0 51 >40  Knee    10.6  14.1         Right Ulnar Motor (Abd Dig Minimi)  34C  Wrist    2.1 <3.1 8.8 >7 B Elbow Wrist 3.1 20.0 65 >50  B Elbow    5.2  8.5  A Elbow B Elbow 1.8 10.0 56 >50  A Elbow    7.0  8.1          Comparison Summary Table   Site NR Peak (ms) Norm Peak (ms) P-T Amp (V) Site1 Site2 Delta-P (ms) Norm Delta (ms)  Right Median/Ulnar Palm Comparison (Wrist - 8cm)  34C  Median J[employer]neyPalm  1.8 <2.2 75.7 Median Palm Ulnar Palm 0.1   Ulnar Palm    1.7 <2.2 21.8       EMG   Side Muscle Ins Act Fibs Psw Fasc Number Recrt Dur Dur. Amp Amp. Poly Poly. Comment  Right AntTibialis Nml Nml Nml Nml Nml Nml Nml Nml Nml Nml Nml Nml N/A  Right Gastroc Nml Nml Nml Nml Nml Nml Nml Nml Nml Nml Nml Nml N/A  Right Flex Dig Long Nml Nml Nml Nml Nml Nml Nml Nml Nml Nml Nml Nml N/A  Right RectFemoris Nml Nml Nml Nml Nml Nml Nml Nml Nml Nml Nml Nml N/A  Right GluteusMed Nml Nml Nml Nml Nml Nml Nml Nml Nml Nml Nml Nml N/A  Right 1stDorInt Nml Nml Nml Nml Nml Nml Nml Nml Nml Nml Nml Nml N/A  Right FlexPolLong Nml Nml Nml Nml Nml Nml Nml Nml Nml Nml Nml Nml N/A  Right PronatorTeres Nml Nml Nml Nml Nml Nml Nml Nml Nml Nml Nml Nml N/A  Right Biceps Nml Nml Nml Nml Nml Nml Nml Nml Nml Nml Nml Nml N/A  Right Triceps Nml Nml Nml Nml Nml Nml Nml Nml Nml Nml Nml Nml N/A  Right Deltoid Nml Nml Nml Nml Nml Nml Nml Nml Nml Nml Nml Nml N/A      Waveforms:

## 2014-04-23 ENCOUNTER — Telehealth: Payer: Self-pay | Admitting: *Deleted

## 2014-04-23 NOTE — Telephone Encounter (Signed)
Patient called back to let us know that she would like to put MRI off for about a month.  She is going to the beach soon and would like to see if being out in the sun for a week will help her some.  She will call Imaging to reschedule.

## 2014-04-23 NOTE — Telephone Encounter (Signed)
Noted  

## 2014-04-24 ENCOUNTER — Telehealth: Payer: Self-pay | Admitting: Neurology

## 2014-04-24 ENCOUNTER — Inpatient Hospital Stay: Admission: RE | Admit: 2014-04-24 | Payer: BC Managed Care – PPO | Source: Ambulatory Visit

## 2014-04-24 NOTE — Telephone Encounter (Signed)
Unable to reach patient [facility] again [facility]

## 2014-04-24 NOTE — Telephone Encounter (Signed)
Pt called f/u on the results from her EMG. C/b [phone]

## 2014-05-03 ENCOUNTER — Ambulatory Visit: Payer: BC Managed Care – PPO | Admitting: Neurology

## 2014-05-03 NOTE — Telephone Encounter (Signed)
Looks like patient was giving her results on 04/23/14 how ever i tried patient just to be sure unable to reach [facility]

## 2014-05-03 NOTE — Telephone Encounter (Signed)
See previous documentation, has this been completed? Has pt been given results? Can encounter be closed? / Sherri S.

## 2014-05-06 ENCOUNTER — Encounter: Payer: BC Managed Care – PPO | Admitting: Neurology

## 2014-05-11 ENCOUNTER — Other Ambulatory Visit: Payer: BC Managed Care – PPO

## 2014-05-11 ENCOUNTER — Inpatient Hospital Stay: Admission: RE | Admit: 2014-05-11 | Payer: BC Managed Care – PPO | Source: Ambulatory Visit

## 2014-05-11 ENCOUNTER — Emergency Department
Admit: 2014-05-11 | Disposition: A | Discharge status: Home / Self Care | Payer: Self-pay | Admitting: Emergency Medicine

## 2014-05-25 ENCOUNTER — Inpatient Hospital Stay: Admission: RE | Admit: 2014-05-25 | Payer: BC Managed Care – PPO | Source: Ambulatory Visit

## 2014-06-06 ENCOUNTER — Other Ambulatory Visit: Payer: Self-pay | Admitting: Family Medicine

## 2014-06-08 ENCOUNTER — Ambulatory Visit
Admission: RE | Admit: 2014-06-08 | Discharge: 2014-06-08 | Disposition: A | Discharge status: Home / Self Care | Payer: BC Managed Care – PPO | Source: Ambulatory Visit | Attending: Neurology | Admitting: Neurology

## 2014-06-08 DIAGNOSIS — F419 Anxiety disorder, unspecified: Secondary | ICD-10-CM

## 2014-06-08 DIAGNOSIS — R209 Unspecified disturbances of skin sensation: Secondary | ICD-10-CM

## 2014-06-08 DIAGNOSIS — R202 Paresthesia of skin: Secondary | ICD-10-CM

## 2014-06-08 MED ORDER — GADOBENATE DIMEGLUMINE 529 MG/ML IV SOLN
12.0000 mL | Freq: Once | INTRAVENOUS | Status: AC | PRN
  Administered 2014-06-08: 12 mL via INTRAVENOUS

## 2014-06-10 ENCOUNTER — Telehealth: Payer: Self-pay | Admitting: Family Medicine

## 2014-06-10 DIAGNOSIS — R202 Paresthesia of skin: Secondary | ICD-10-CM

## 2014-06-10 DIAGNOSIS — E538 Deficiency of other specified B group vitamins: Secondary | ICD-10-CM

## 2014-06-10 DIAGNOSIS — E559 Vitamin D deficiency, unspecified: Secondary | ICD-10-CM

## 2014-06-10 NOTE — Telephone Encounter (Signed)
Patient has had tingling in her hands and feet since February. The tingling is stronger on her right side and when she lays down. Patient went to a neurologist and she did a nerve test and MRI and both came back fine,Dr.Aron prescribed 50,000 iu of Vitamin D for 4 weeks in March.  The Vitamin D started to help after 3 weeks,but patient couldn't get a refill on the prescription.  Patient is taking Vitamin B.  Patient wants to know if lab work can be done to check her Vit D and Vit B levels and sugar when she has fasted to make sure she doesn't have diabetes and make a follow up appointment with Dr.Aron.

## 2014-06-10 NOTE — Telephone Encounter (Signed)
Yes. Orders entered

## 2014-06-10 NOTE — Addendum Note (Signed)
Addended by: Dianne DunARON, TALIA M on: 06/10/2014 01:36 PM   Modules accepted: Orders

## 2014-06-13 ENCOUNTER — Other Ambulatory Visit (INDEPENDENT_AMBULATORY_CARE_PROVIDER_SITE_OTHER): Payer: BC Managed Care – PPO

## 2014-06-13 DIAGNOSIS — Z0000 Encounter for general adult medical examination without abnormal findings: Secondary | ICD-10-CM | POA: Diagnosis not present

## 2014-06-13 DIAGNOSIS — R202 Paresthesia of skin: Secondary | ICD-10-CM | POA: Diagnosis not present

## 2014-06-13 DIAGNOSIS — E559 Vitamin D deficiency, unspecified: Secondary | ICD-10-CM | POA: Diagnosis not present

## 2014-06-13 DIAGNOSIS — E538 Deficiency of other specified B group vitamins: Secondary | ICD-10-CM

## 2014-06-13 LAB — VITAMIN B12: Vitamin B-12: >1500 pg/mL — ABNORMAL HIGH (ref 211–911)

## 2014-06-13 LAB — COMPREHENSIVE METABOLIC PANEL
ALBUMIN: 4.3 g/dL (ref 3.5–5.2)
ALK PHOS: 28 U/L — AB (ref 39–117)
ALT: 17 U/L (ref 0–35)
AST: 27 U/L (ref 0–37)
BUN: 20 mg/dL (ref 6–23)
CO2: 29 mEq/L (ref 19–32)
Calcium: 9.6 mg/dL (ref 8.4–10.5)
Chloride: 101 mEq/L (ref 96–112)
Creatinine, Ser: 1.01 mg/dL (ref 0.40–1.20)
GFR: 63.13 mL/min (ref >60.00)
Glucose, Bld: 90 mg/dL (ref 70–99)
POTASSIUM: 3.8 meq/L (ref 3.5–5.1)
SODIUM: 135 meq/L (ref 135–145)
TOTAL PROTEIN: 6.8 g/dL (ref 6.0–8.3)
Total Bilirubin: 0.7 mg/dL (ref 0.2–1.2)

## 2014-06-13 LAB — CBC
HCT: 39.9 % (ref 36.0–46.0)
Hemoglobin: 13.6 g/dL (ref 12.0–15.0)
MCHC: 34.2 g/dL (ref 30.0–36.0)
MCV: 90.8 fl (ref 78.0–100.0)
Platelets: 198 10*3/uL (ref 150.0–400.0)
RBC: 4.39 Mil/uL (ref 3.87–5.11)
RDW: 13.3 % (ref 11.5–15.5)
WBC: 4.4 10*3/uL (ref 4.0–10.5)

## 2014-06-13 LAB — HEMOGLOBIN A1C: Hgb A1c MFr Bld: 5.4 % (ref 4.6–6.5)

## 2014-06-13 LAB — VITAMIN D 25 HYDROXY (VIT D DEFICIENCY, FRACTURES): VITD: 39.56 ng/mL (ref 30.00–100.00)

## 2014-06-17 ENCOUNTER — Ambulatory Visit (INDEPENDENT_AMBULATORY_CARE_PROVIDER_SITE_OTHER)
Admission: RE | Admit: 2014-06-17 | Discharge: 2014-06-17 | Disposition: A | Discharge status: Home / Self Care | Payer: BC Managed Care – PPO | Source: Ambulatory Visit | Attending: Family Medicine | Admitting: Family Medicine

## 2014-06-17 ENCOUNTER — Encounter: Payer: Self-pay | Admitting: Family Medicine

## 2014-06-17 ENCOUNTER — Ambulatory Visit (INDEPENDENT_AMBULATORY_CARE_PROVIDER_SITE_OTHER): Payer: BC Managed Care – PPO | Admitting: Family Medicine

## 2014-06-17 VITALS — BP 110/60 | HR 65 | Temp 97.9°F | Wt 135.5 lb

## 2014-06-17 DIAGNOSIS — R202 Paresthesia of skin: Secondary | ICD-10-CM | POA: Diagnosis not present

## 2014-06-17 DIAGNOSIS — G4700 Insomnia, unspecified: Secondary | ICD-10-CM | POA: Diagnosis not present

## 2014-06-17 NOTE — Progress Notes (Signed)
Subjective:   Patient ID: Brittany Sanchez, female    DOB: 1970-06-30, 44 y.o.   MRN: [id number]  Brittany Sanchez is a pleasant 44 y.o. year old female who presents to clinic today with Follow-up  on 06/17/2014  HPI: Here for follow up paresthesias.  I saw her in 04/2014 for this.  At that time, B12 low end of normal and Vit D low. Lab Results  Component Value Date   VITAMINB12 >1500* 06/13/2014   GFR mildly decreased.  CBC, TSH, CMET otherwise unremarkable. Lab Results  Component Value Date   WBC 4.4 06/13/2014   HGB 13.6 06/13/2014   HCT 39.9 06/13/2014   MCV 90.8 06/13/2014   PLT 198.0 06/13/2014   Lab Results  Component Value Date   TSH 2.90 03/21/2014   Lab Results  Component Value Date   ALT 17 06/13/2014   AST 27 06/13/2014   ALKPHOS 28* 06/13/2014   BILITOT 0.7 06/13/2014   Lab Results  Component Value Date   CREATININE 1.01 06/13/2014   My assessment and plan at that time:   New- progressive. Complicated/complex constellation of symptoms. Initially, symptoms could have been consistent with carpal tunnel syndrome- bilateral hands, nocturnal, but now that her legs, head and neck are involved, this is no longer consistent with this. Vit B12 low end of normal but I would not expect this would cause such extreme symptoms. She recently started taking 5000 mcg of Vit B12 daily. Advised not to increase this dose. Will also replete her Vit D with high dose Vit D- eRx sent x 7 weeks.   Given rx for valium after discussing sedation and addiction potential. Hopefully this will help with sleep and increased anxiety concerning these symptoms.  Refer to neuro for further work up. ? EMG The patient indicates understanding of these issues and agrees with the plan.  Has seen Dr. Allena Katz twice since this visit. Notes reviewed.  EMG neg in 04/2014. MRI 06/08/14- reviewed- negative.  In addition to this, fell and dislocated right shoulder and was seen in ER on 4/9.   Following up with ortho again next week.  This seems to have complicated her complaints as she is not sure if aggrevated her paresthesias. Still having numbness in her hands and feet bilaterally, right side of her body more frequently and "stronger tingling" then left.  Seems to happen at night but also when she is ovulating.  Per pt, told her GYN this who checked her "menopausal labs" and told her everything was ok.  Has appt to see psychiatry in a couple of weeks.  Per pt, GYN called psych for her because "he thinks that this is just anxiety."  Psych then sent in rx for celexa for her.  She is not taking it as one dose made her feel sleepy.  Also not taking neurontin or elavil that were both at one point prescribed by either neuro or myself.  "I am desperate."  She has appt with neuro in July but she "needs to do something before then."   Current Outpatient Prescriptions on File Prior to Visit  Medication Sig Dispense Refill  . diazepam (VALIUM) 5 MG tablet Take 1 tablet (5 mg total) by mouth every 12 (twelve) hours as needed for anxiety (or insomnia). 30 tablet 1  . FLUoxetine (PROZAC) 40 MG capsule   3  . ibuprofen (ADVIL,MOTRIN) 800 MG tablet Take 800 mg by mouth every 8 (eight) hours as needed for moderate pain.     Marland Kitchen  meloxicam (MOBIC) 15 MG tablet Take 1 tablet (15 mg total) by mouth daily as needed for pain. 30 tablet 0   No current facility-administered medications on file prior to visit.    No Known Allergies  Past Medical History  Diagnosis Date  . PMS (premenstrual syndrome)     Past Surgical History  Procedure Laterality Date  . Breast enhancement surgery      Family History  Problem Relation Age of Onset  . Cancer Mother [id number]    mesothelioma  . Sleep apnea Brother   . Cancer Maternal Grandmother     ? ovarian  . Diabetes Maternal Grandfather   . Alcohol abuse Other   . Depression Other   . Cancer Father [id number]    mesothelioma    History   Social History  .  Marital Status: Married    Spouse Name: N/A  . Number of Children: 2  . Years of Education: N/A   Occupational History  . 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 Smoking status: Never Smoker   . Smokeless tobacco: Not on file  . Alcohol Use: 0.0 oz/week    0 Standard drinks or equivalent per week     Comment: rare  . Drug Use: No  . Sexual Activity:    Partners: Male    Birth Control/ Protection: Rhythm   Other Topics Concern  . Not on file   Social History Narrative   Gym 3-4 times a week.     Lives with husband in a two story home.  Has 2 children.    Teaches middle school, ESL.     Highest level of education:  Masters   The PMH, PSH, Social History, Family History, Medications, and allergies have [facility], and have been updated if relevant.  Review of Systems  Constitutional: Positive for fatigue. Negative for unexpected weight change.  Eyes: Negative.   Respiratory: Negative.   Cardiovascular: Negative.  Negative for palpitations and leg swelling.  Gastrointestinal: Negative.   Endocrine: Negative.   Musculoskeletal: Negative for gait problem.  Skin: Negative.   Neurological: Positive for numbness and headaches. Negative for dizziness, tremors, seizures, syncope, facial asymmetry, speech difficulty, weakness and light-headedness.  Hematological: Negative.   Psychiatric/Behavioral: Positive for sleep disturbance. Negative for hallucinations and dysphoric mood. The patient is not nervous/anxious.   All other systems reviewed and are negative.      Objective:    BP 110/60 mmHg  Pulse 65  Temp(Src) 97.9 F (36.6 C) (Oral)  Wt 135 lb 8 oz (61.462 kg)  SpO2 97% Wt Readings from Last 3 Encounters:  06/17/14 135 lb 8 oz (61.462 kg)  04/12/14 140 lb 1 oz (63.532 kg)  04/03/14 138 lb 8 oz (62.823 kg)     Physical Exam  Constitutional: She is oriented to person, place, and time. She appears well-developed and  well-nourished. No distress.  HENT:  Head: Normocephalic and atraumatic.  Eyes: Conjunctivae are normal.  Neck: Normal range of motion.  Cardiovascular: Normal rate.   Pulmonary/Chest: Effort normal.  Musculoskeletal: Normal range of motion. She exhibits no edema.  Neurological: She is alert and oriented to person, place, and time. She has normal strength and normal reflexes. No cranial nerve deficit or sensory deficit. Coordination and gait normal.  Skin: Skin is warm and dry.  Psychiatric: She has a normal mood and affect. Her behavior is normal. Judgment and thought content normal.  Nursing note and vitals  reviewed.         Assessment & Plan:   Paresthesia - Plan: DG Cervical Spine Complete, MR Cervical Spine Wo Contrast, Ambulatory referral to Neurology  Insomnia No Follow-up on file.

## 2014-06-17 NOTE — Patient Instructions (Signed)
Great to see you. Hang in there.  I will call you with your xray results and then we can set up your MRI.

## 2014-06-17 NOTE — Assessment & Plan Note (Signed)
Deteriorated. >25 minutes spent in face to face time with patient, >50% spent in counselling or coordination of care. Very complicated presentation and as I explained to [name], I am not sure what is causing her constellation of symptoms.  I do think that seeing a psychiatrist is a good idea but anxiety should be a diagnosis of exclusion. Will refer to tertiary academic site for second neurological opinion. Also get cervical MRI to rule out cervical etiology although this is not classic presentation for this either. The patient indicates understanding of these issues and agrees with the plan.

## 2014-06-17 NOTE — Progress Notes (Signed)
Pre visit review using our clinic review tool, if applicable. No additional management support is needed unless otherwise documented below in the visit note. 

## 2014-06-19 ENCOUNTER — Ambulatory Visit: Payer: Self-pay | Admitting: Specialist

## 2014-06-21 ENCOUNTER — Other Ambulatory Visit: Payer: Self-pay | Admitting: *Deleted

## 2014-06-21 ENCOUNTER — Telehealth: Payer: Self-pay | Admitting: Neurology

## 2014-06-21 MED ORDER — DULOXETINE HCL 30 MG PO CPEP: 30.0000 mg | ORAL_CAPSULE | Freq: Every day | ORAL | Status: DC

## 2014-06-21 NOTE — Telephone Encounter (Signed)
Called patient back and left message for her to call me back.  

## 2014-06-21 NOTE — Telephone Encounter (Signed)
Brittany ConnersMarcy has a question about the tingling she is having and would like a [phone]

## 2014-06-21 NOTE — Telephone Encounter (Signed)
She can try Cymbalta 30mg  daily.  If agreeable, please send Rx.  Thanks.

## 2014-06-21 NOTE — Telephone Encounter (Signed)
Patient is still having terrible tingling in her hands and feet.  She stopped taking the nortriptylline because she said it was not working.  She has an appointment in July but this is driving her crazy.  Please advise.  Thanks.

## 2014-06-21 NOTE — Telephone Encounter (Signed)
Patient called back and agreed to try med.

## 2014-06-24 ENCOUNTER — Encounter: Payer: Self-pay | Admitting: Neurology

## 2014-06-24 ENCOUNTER — Ambulatory Visit (INDEPENDENT_AMBULATORY_CARE_PROVIDER_SITE_OTHER): Payer: BC Managed Care – PPO | Admitting: Neurology

## 2014-06-24 VITALS — BP 108/70 | HR 70 | Wt 135.1 lb

## 2014-06-24 DIAGNOSIS — F419 Anxiety disorder, unspecified: Secondary | ICD-10-CM | POA: Diagnosis not present

## 2014-06-24 DIAGNOSIS — R202 Paresthesia of skin: Secondary | ICD-10-CM

## 2014-06-24 DIAGNOSIS — R209 Unspecified disturbances of skin sensation: Secondary | ICD-10-CM | POA: Diagnosis not present

## 2014-06-24 NOTE — Progress Notes (Signed)
Follow-up Visit   Date: 06/24/2014    Brittany Sanchez MRN: [id number] DOB: 03/19/1970   Interim History: Brittany Sanchez is a 44 y.o. left-handed Caucasian female with PMS returning to the clinic for follow-up of generalized paresthesias of the hands, arms, and feet.  The patient was accompanied to the clinic by self  History of present illness: Starting around early February 2016, she developed tingling sensation of the medial aspect of the hands which progressed it involve the sole of the feet with occasional radiation into the lower leg and forearm. Tingling sensation is always present, but worse at night time. As soon as she tries to relax, symptoms worsen but if she tries to walk around, it helps. She also has tingling of the neck and head. She denies any weakness or falls. Denies the urge to want to move her arms or legs.   She feels that she is emotionally on a roller coaster. She is also having left sided chest pain and saw cardiology whose evaluation was normal. She saw a chiropractor for the same symptoms who did adjustments, but there was no change in paresthesias.   She saw Dr. Dayton MartesAron for the above symptoms and had routine blood work which showed borderline-normal vitamin B12 and vitamin D deficiency. She was also started on valium which helps her relax and sleep at night as well as makes her comfortable. She usually wakes up around 3am with paresthesias again.  She is very active and exercises 3-4 times per week, teaches zumba, works as a Education officer, museummiddle school teacher, and is a mother of three so keeps very busy.  She denies any double vision or new vision changes. She complains of memory problems, for instance she finds it difficult to remember her children's birthday or when completing a form she forgot where she graduated high school. She always feels stressed and is always busy, but denies any new stressors.   UPDATE 06/24/2014:  At her last visit, she was started on  nortriptyline and reports that it made symptoms worse, so stopped it.  Her EMG and MRI brain returned normal.  She [facility] Dr. Maryruth BunKapur, who suspects she has PMDD and started her on ambien, recommended increasing her Cymbalta to 60mg , and was started on lamictal for mood swings.  She continues to have tingling involving the hands and feet, sparing the face and abdomen.  Symptoms are always present, but intensity varies.  She is planning on seeing [facility] to have her hormone levels checked, since her symptoms are worse around her menstrual cycle.   [facility].   Medications:  Current Outpatient Prescriptions on File Prior to Visit  Medication Sig Dispense Refill  . diazepam (VALIUM) 5 MG tablet Take 1 tablet (5 mg total) by mouth every 12 (twelve) hours as needed for anxiety (or insomnia). 30 tablet 1  . FLUoxetine (PROZAC) 10 MG tablet Take by mouth.    Marland Kitchen. ibuprofen (ADVIL,MOTRIN) 800 MG tablet Take 800 mg by mouth every 8 (eight) hours as needed for moderate pain.     . meloxicam (MOBIC) 15 MG tablet Take 1 tablet (15 mg total) by mouth daily as needed for pain. 30 tablet 0  . traZODone (DESYREL) 50 MG tablet Take 50 mg by mouth at bedtime.     No current facility-administered medications on file prior to visit.    Allergies: No Known Allergies  Review of Systems:  CONSTITUTIONAL: No fevers, chills, night sweats, or weight loss.  EYES: No visual changes or eye pain ENT: No hearing changes.  No history of nose bleeds.   RESPIRATORY: No cough, wheezing and shortness of breath.   CARDIOVASCULAR: Negative for chest pain, and palpitations.   GI: Negative for abdominal discomfort, blood in stools or black stools.  No recent change in bowel habits.   GU:  No history of incontinence.   MUSCLOSKELETAL: No history of joint pain or swelling.  No myalgias.   SKIN: Negative for  lesions, rash, and itching.   ENDOCRINE: Negative for cold or heat intolerance, polydipsia or goiter.   PSYCH:  + depression or anxiety symptoms.   NEURO: As Above.   Vital Signs:  BP 108/70 mmHg  Pulse 70  Wt 135 lb 2 oz (61.292 kg)  SpO2 99%  Neurological Exam: MENTAL STATUS including orientation to time, place, person, recent and remote memory, attention span and concentration, language, and fund of knowledge is normal.  Speech is not dysarthric.  CRANIAL NERVES: No visual field defects.  Pupils equal round and reactive to light.  Normal conjugate, extra-ocular eye movements in all directions of gaze.  No ptosis. Normal facial sensation.  Face is symmetric. Palate elevates symmetrically.  Tongue is midline.  MOTOR:  Motor strength is 5/5 in all extremities.  No atrophy, fasciculations or abnormal movements.  No pronator drift.  Tone is normal.    MSRs:  Reflexes are 2+/4 throughout.  SENSORY:  Intact to vibration, temperature, and pin prick throughout.  COORDINATION/GAIT:  Normal finger-to- nose-finger and heel-to-shin.  Intact rapid alternating movements bilaterally.  Gait narrow based and stable.   Data: Labs 03/21/2014: Vitamin B12 387, TSH 2.90, vitamin D 25* Lab Results  Component Value Date   HGBA1C 5.4 06/13/2014    EMG of the right upper and lower extremities 04/22/2014:  This is a normal study of the right upper and lower extremities. In particular, there is no evidence of a generalized sensorimotor polyneuropathy, carpal tunnel syndrome, cervical/lumbosacral radiculopathy affecting the right side.  MRI brain wwo contrast 06/08/2014:  Negative brain MRI. No explanation for patient's symptoms.   IMPRESSION/PLAN: Mrs. Collyer is a 44 year-old female returning for urgent appointment because of worsening paresthesias of the body.  Symptoms started in February 2016 and have been present since then.  Her neurological exam, electrodiagnostic testing, MRI brain, and routine  lab studies are all normal and she was reassured that there is no evidence of a worrisome neurodegenerative disorder.  There is a huge overlay of anxiety and it is possible that her symptoms are manifestation of stress, especially since her symptoms resolve with valium.  Symptoms are very atypical for small fiber neuropathy, but we can send her for a work-up at a tertiary care center.  She is scheduled for second opinion at [facility].  From a symptomatic standpoint, continue Cymbalta  daily and increase to  next week.  She did not tolerate nortriptyline in the past.  She has close follow-up with Dr. 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  Greater than 50% of this time was spent in counseling, explanation of diagnosis, planning of further management, and coordination of care.   Thank you for allowing me to participate in patient's care.  If I can  answer any additional questions, I would be pleased to do so.    Sincerely,    Donika K. Posey Pronto, DO

## 2014-06-24 NOTE — Patient Instructions (Signed)
Increase Cymbalta to 60mg  daily next week If symptoms worsen, call my office and we can try to call to [facility]

## 2014-06-25 ENCOUNTER — Other Ambulatory Visit: Payer: Self-pay | Admitting: Family Medicine

## 2014-07-04 ENCOUNTER — Telehealth: Payer: Self-pay | Admitting: Family Medicine

## 2014-07-04 DIAGNOSIS — N926 Irregular menstruation, unspecified: Secondary | ICD-10-CM

## 2014-07-04 NOTE — Telephone Encounter (Signed)
Pt would like new referral to see neuroendocrinologist. She is having tingling in hands and feet during mensural cycle. Tingling is 24/7 and but extremley painful during menstruation and ovulation. She has tried everything. She has see Ob-gyn, neurologist, cardiologist, hormone treatment therapy. She has had Mri of head and has Monday for MRI of spine on Monday. Please advise.   Dept of [facility] Dr. Shane CrutchJulie Sharpless, MD P: [phone] or P: [phone] F: [phone]

## 2014-07-04 NOTE — Telephone Encounter (Signed)
Brittany Sanchez, please get more information.  Does neurosurgery feel she needs to see endo?

## 2014-07-05 NOTE — Telephone Encounter (Signed)
Spoke with pt at length.  She googled the neuroendocrinologist from [facility] and called their office, but they would not have a conversation with her without a referral from her PCP.  She states that her symptoms are a "fit" for a neuroendocrinologist.  She has not seen an endocrinologist before is open to seeing Dr. Elvera LennoxGherghe if you think appropriate.  She is very frustrated and in discomfort around the time of her menses.  Her inability to sleep seems to be one of the greater sources of frustration as well. .Marland Kitchen

## 2014-07-08 ENCOUNTER — Telehealth: Payer: Self-pay | Admitting: Neurology

## 2014-07-08 ENCOUNTER — Ambulatory Visit (HOSPITAL_COMMUNITY)
Admission: RE | Admit: 2014-07-08 | Discharge: 2014-07-08 | Disposition: A | Discharge status: Home / Self Care | Payer: BC Managed Care – PPO | Source: Ambulatory Visit | Attending: Family Medicine | Admitting: Family Medicine

## 2014-07-08 DIAGNOSIS — R202 Paresthesia of skin: Secondary | ICD-10-CM | POA: Insufficient documentation

## 2014-07-08 DIAGNOSIS — R200 Anesthesia of skin: Secondary | ICD-10-CM | POA: Insufficient documentation

## 2014-07-08 DIAGNOSIS — M5032 Other cervical disc degeneration, mid-cervical region: Secondary | ICD-10-CM | POA: Diagnosis not present

## 2014-07-08 NOTE — Telephone Encounter (Signed)
Pt called and was wanting some office notes faxed to another neurologists/Dawn CB#[phone]

## 2014-07-08 NOTE — Telephone Encounter (Signed)
I can put referral in but not sure if they will agree to see her before she sees an endocrinologist.  Referral placed.

## 2014-07-08 NOTE — Telephone Encounter (Signed)
07/08/14-Faxed to Dr[facility] Shane CrutchJulie Sharpless (see phone note[facility]. Also LVM on pt phone that I faxed records and would give their office a few days to review. I asked pt to call us back once she was set up for an appt.  Dr. Jane CanarySharpless Phone [phone] Fax: [phone]

## 2014-07-09 NOTE — Telephone Encounter (Signed)
Notes faxed per patient's request.

## 2014-07-10 ENCOUNTER — Ambulatory Visit: Payer: BC Managed Care – PPO

## 2014-07-16 ENCOUNTER — Ambulatory Visit: Payer: BC Managed Care – PPO | Admitting: Neurology

## 2014-07-25 NOTE — Telephone Encounter (Signed)
07/25/14-Correction, this referral is to [facility]. Spoke with Inetta Fermo there and records were sent over to the Diabetes and Endocrinology Clinic at [facility] on June 8th for review and was given the # there to call and check on status. Spoke to [place] and the original new pt coord left office 2 weeks ago and she just started on catching up on referrals. Victorino Dike faxed me their referral form and request me send records again. She will expedite this as we have been waiting 3 weeks now. Victorino Dike will review and call me back with appt.  [facility] [address] [place], [place] [address] Phone: [phone] Fax: [phone]

## 2014-08-01 ENCOUNTER — Telehealth: Payer: Self-pay | Admitting: Family Medicine

## 2014-08-01 NOTE — Telephone Encounter (Signed)
noted 

## 2014-08-01 NOTE — Telephone Encounter (Signed)
[facility] states the pt needs to follow up with a GYN for this issues. This is not a endocrine disorder. Spoke with pt and let her know they declined her as a patient. Pt states she has seen her gyn for this issue and they sent her to a psychiatrist. The psychiatrist stated that her issue was not a psychological issue. Pt is ok with cancelling the referral. She states she is [facility] 2 [facility] ([facility] and a follow up with Dr. Sherryll BurgerShah in Aug). Pt also saw a Biological hormone dr (?) and they did a bunch of blood work. Her magnesium was very low and now has added a multivitamin to her daily medicine.   Referral cancelled 08/01/14

## 2014-08-15 ENCOUNTER — Ambulatory Visit: Payer: BC Managed Care – PPO | Admitting: Neurology

## 2014-08-23 ENCOUNTER — Ambulatory Visit (INDEPENDENT_AMBULATORY_CARE_PROVIDER_SITE_OTHER)
Admission: RE | Admit: 2014-08-23 | Discharge: 2014-08-23 | Disposition: A | Discharge status: Home / Self Care | Payer: BC Managed Care – PPO | Source: Ambulatory Visit | Attending: Family Medicine | Admitting: Family Medicine

## 2014-08-23 ENCOUNTER — Telehealth: Payer: Self-pay | Admitting: *Deleted

## 2014-08-23 ENCOUNTER — Encounter: Payer: Self-pay | Admitting: Family Medicine

## 2014-08-23 ENCOUNTER — Ambulatory Visit
Admission: RE | Admit: 2014-08-23 | Discharge: 2014-08-23 | Disposition: A | Discharge status: Home / Self Care | Payer: BC Managed Care – PPO | Source: Ambulatory Visit | Attending: Family Medicine | Admitting: Family Medicine

## 2014-08-23 ENCOUNTER — Ambulatory Visit (INDEPENDENT_AMBULATORY_CARE_PROVIDER_SITE_OTHER): Payer: BC Managed Care – PPO | Admitting: Family Medicine

## 2014-08-23 VITALS — BP 104/60 | HR 68 | Temp 98.5°F | Wt 135.5 lb

## 2014-08-23 DIAGNOSIS — M79643 Pain in unspecified hand: Secondary | ICD-10-CM

## 2014-08-23 LAB — RHEUMATOID FACTOR

## 2014-08-23 NOTE — Patient Instructions (Signed)
Go to the lab on the way out.  We'll contact you with your lab and xray report. Take care.  Glad to see you.   

## 2014-08-23 NOTE — Telephone Encounter (Signed)
Patient seen in office today.  While in X-ray, patient stated she forgot to ask what she can take for pain.

## 2014-08-23 NOTE — Telephone Encounter (Signed)
Left detailed message on voicemail.  

## 2014-08-23 NOTE — Progress Notes (Signed)
Pre visit review using our clinic review tool, if applicable. No additional management support is needed unless otherwise documented below in the visit note.  B feet and hand tingling since 03/2014.   Had seen neuro locally and at [facility], notes and labs reviewed.  SPEP at [facility] w/o M spike noted, d/w pt. Prev labs neg for neuropathy w/u.  She still has tingling x4 extremities, worse when she has a period.   Recently with hand pain, diffuse PIP pain.  Some finger swelling recently, can't wear her rings.  She didn't have red joints in her hands.    Meds, vitals, and allergies reviewed.   ROS: See HPI.  Otherwise, noncontributory.  nad ncat Neck supple, no LA rrr ctab Ext w/o edema.  Skin w/o rash.  Hands with normal inspection except for slightly enlargement of the PIP joints B.  Pain with grip but no acute erythema at the joint lines.  Grossly NV intact on the hands.  No true weakness in the hands.    LMP was 07/30/14.

## 2014-08-23 NOTE — Telephone Encounter (Signed)
meloxicam is the best option.  If that isn't working, I would try adding on tylenol  TID prn pain.  We may need to change her meds around when I see her labs.  I wouldn't add on anything else yet- her options are limited some by her other current meds.   Thanks.

## 2014-08-24 LAB — SEDIMENTATION RATE: Sed Rate: 1 mm/hr (ref 0–20)

## 2014-08-25 DIAGNOSIS — M79643 Pain in unspecified hand: Secondary | ICD-10-CM | POA: Insufficient documentation

## 2014-08-25 NOTE — Assessment & Plan Note (Signed)
Worse recently, unclear if related to the tingling.  Will route to PCP as FYI. Check plain films today, along with ANA, ESR, RF, CCP.  D/w pt.  Will await all results and then notify pt.  She may end up needing rheum referral.  RA would be within her ddx.  D/w pt that no clear dx is known at this point.  She agrees with plan.

## 2014-08-26 LAB — CYCLIC CITRUL PEPTIDE ANTIBODY, IGG

## 2014-08-26 LAB — ANA: Anti Nuclear Antibody(ANA): NEGATIVE

## 2014-09-24 ENCOUNTER — Encounter: Payer: Self-pay | Admitting: Obstetrics and Gynecology

## 2014-09-24 ENCOUNTER — Ambulatory Visit (INDEPENDENT_AMBULATORY_CARE_PROVIDER_SITE_OTHER): Payer: BC Managed Care – PPO | Admitting: Obstetrics and Gynecology

## 2014-09-24 VITALS — BP 104/68 | HR 88 | Wt 133.6 lb

## 2014-09-24 DIAGNOSIS — E559 Vitamin D deficiency, unspecified: Secondary | ICD-10-CM | POA: Diagnosis not present

## 2014-09-24 DIAGNOSIS — R202 Paresthesia of skin: Secondary | ICD-10-CM

## 2014-09-24 DIAGNOSIS — R102 Pelvic and perineal pain: Secondary | ICD-10-CM | POA: Diagnosis not present

## 2014-09-24 DIAGNOSIS — R232 Flushing: Secondary | ICD-10-CM

## 2014-09-24 MED ORDER — FLUOXETINE HCL 20 MG PO CAPS: 20.0000 mg | ORAL_CAPSULE | Freq: Every day | ORAL | Status: DC

## 2014-09-24 NOTE — Progress Notes (Signed)
Subjective:     Patient ID: Brittany Sanchez, female   DOB: Aug 15, 1970, 44 y.o.   MRN: [id number]  HPI Reports sudden onset of tingling in fingertips and toes when laying down at night that has progressively worsened x 7 months, along with onset of occasional hotflashes and night sweets; mood swings and worsening PMDD. Low back pain with menses. Had ablation done 2 years ago with good results.  Reports seeing multiple doctors (neurology, PCP, gynecology; homeopathic) and multiple test and scans- with trying different medications to still have no improvement. Some notes are in Epic and can be reviewed   Review of Systems Nightly tingling in fingers and toes bilaterally Hot flashes Night sweets occasionally worsening mood swings with PMDD Low back pain with menses     Objective:   Physical Exam A&O x4 Well groomed female in no current distress PE not indicated     Assessment:     Vasomotor s/s PMDD Tingling in fingers/toes of unknown etiology Sleep disturbance     Plan:     Serum labs obtained, along with saliva testing ordered. To continue all meds at current doses. Pelvic u/s ordered.  Will f/u accordingly.

## 2014-09-25 ENCOUNTER — Ambulatory Visit: Payer: BC Managed Care – PPO

## 2014-09-25 DIAGNOSIS — R102 Pelvic and perineal pain: Secondary | ICD-10-CM | POA: Diagnosis not present

## 2014-09-25 LAB — COMPREHENSIVE METABOLIC PANEL
A/G RATIO: 2 (ref 1.1–2.5)
ALT: 16 IU/L (ref 0–32)
AST: 23 IU/L (ref 0–40)
Albumin: 4.3 g/dL (ref 3.5–5.5)
Alkaline Phosphatase: 41 IU/L (ref 39–117)
BILIRUBIN TOTAL: 0.3 mg/dL (ref 0.0–1.2)
BUN/Creatinine Ratio: 20 (ref 9–23)
BUN: 24 mg/dL (ref 6–24)
CALCIUM: 9.4 mg/dL (ref 8.7–10.2)
CHLORIDE: 98 mmol/L (ref 97–108)
CO2: 25 mmol/L (ref 18–29)
Creatinine, Ser: 1.22 mg/dL — ABNORMAL HIGH (ref 0.57–1.00)
GFR, EST AFRICAN AMERICAN: 62 mL/min/{1.73_m2} (ref >59)
GFR, EST NON AFRICAN AMERICAN: 54 mL/min/{1.73_m2} — AB (ref >59)
GLOBULIN, TOTAL: 2.2 g/dL (ref 1.5–4.5)
Glucose: 84 mg/dL (ref 65–99)
POTASSIUM: 3.8 mmol/L (ref 3.5–5.2)
SODIUM: 138 mmol/L (ref 134–144)
Total Protein: 6.5 g/dL (ref 6.0–8.5)

## 2014-09-25 LAB — VITAMIN B12: VITAMIN B 12: 1825 pg/mL — AB (ref 211–946)

## 2014-09-25 LAB — MAGNESIUM: Magnesium: 2.3 mg/dL (ref 1.6–2.3)

## 2014-09-25 LAB — PROLACTIN: Prolactin: 26.1 ng/mL — ABNORMAL HIGH (ref 4.8–23.3)

## 2014-09-25 LAB — VITAMIN D 25 HYDROXY (VIT D DEFICIENCY, FRACTURES): VIT D 25 HYDROXY: 41.8 ng/mL (ref 30.0–100.0)

## 2014-09-25 LAB — FERRITIN: Ferritin: 38 ng/mL (ref 15–150)

## 2014-09-25 LAB — TESTOSTERONE, FREE, TOTAL, SHBG: Testosterone, Free: 1.9 pg/mL (ref 0.0–4.2)

## 2014-09-25 LAB — THYROID PANEL WITH TSH
Free Thyroxine Index: 2.3 (ref 1.2–4.9)
T3 Uptake Ratio: 32 % (ref 24–39)
T4, Total: 7.2 ug/dL (ref 4.5–12.0)
TSH: 1.79 u[IU]/mL (ref 0.450–4.500)

## 2014-09-25 LAB — PROGESTERONE: PROGESTERONE: 11.4 ng/mL

## 2014-09-25 LAB — FOLLICLE STIMULATING HORMONE: FSH: 1.9 m[IU]/mL

## 2014-09-25 LAB — DHEA-SULFATE: DHEA SO4: 228.1 ug/dL (ref 57.3–279.2)

## 2014-09-25 LAB — ESTRADIOL: Estradiol: 35.5 pg/mL

## 2014-10-01 ENCOUNTER — Other Ambulatory Visit: Payer: Self-pay | Admitting: Obstetrics and Gynecology

## 2014-10-01 DIAGNOSIS — E229 Hyperfunction of pituitary gland, unspecified: Principal | ICD-10-CM

## 2014-10-01 DIAGNOSIS — R7989 Other specified abnormal findings of blood chemistry: Secondary | ICD-10-CM

## 2014-10-01 DIAGNOSIS — R748 Abnormal levels of other serum enzymes: Secondary | ICD-10-CM | POA: Insufficient documentation

## 2014-10-03 ENCOUNTER — Telehealth: Payer: Self-pay | Admitting: *Deleted

## 2014-10-03 NOTE — Telephone Encounter (Signed)
-----   Message from Ulyses Amor, [place] sent at 10/01/2014  4:29 PM EDT ----- Please let her know vit D levels and other labs are normal (including magnesium), but her B12 is too high- if she is on B12 supplements or MTV with it in it she needs to stop immediately.  Also her prolactin levels are high- secreted by pituitary gland in head.  Recommend scan of gland if she hasn't had one already with other work ups she has had done. Let me know & I can put in order for it. Could be either B12 or prolactin causing tingling/numbness in fingers.

## 2014-10-03 NOTE — Telephone Encounter (Signed)
Notified pt of results, she voiced understanding.

## 2014-10-11 ENCOUNTER — Telehealth: Payer: Self-pay | Admitting: Obstetrics and Gynecology

## 2014-10-11 NOTE — Telephone Encounter (Signed)
Mel pt had mri she wanted to know if that would be sufficient, pls advise

## 2014-10-11 NOTE — Telephone Encounter (Signed)
Had MRI on her head and wanted to know if that is sufficient b/c Mel had wanted her to have a CT and she hasn't heard back about it. Also she turned in a saliva test on sept 7 and didn';t put  Her LMP on it.  It was August 24th

## 2014-10-23 ENCOUNTER — Telehealth: Payer: Self-pay | Admitting: Obstetrics and Gynecology

## 2014-10-23 NOTE — Telephone Encounter (Signed)
Hey mel have you spoke with pt??

## 2014-10-23 NOTE — Telephone Encounter (Signed)
PT STATED SHE CALLED LAST WEEK AND SHE HAS NOT HEARD ANYTHING BACK, SHE WOULD LIKE A CALL BACK FROM YOU

## 2014-10-25 ENCOUNTER — Encounter: Payer: Self-pay | Admitting: Obstetrics and Gynecology

## 2014-10-25 NOTE — Telephone Encounter (Signed)
Please let her know i was waiting on saliva results to call, that everything so far has looked ok- i just received saliva results today & I will try to call her tues.

## 2014-10-31 NOTE — Telephone Encounter (Signed)
-----   Message from Ulyses Amor, [place] sent at 10/29/2014  4:07 PM EDT ----- LM for her to call back- OK for you to review findings with her (she picked up a copy of labs)- estrogen and testosterone levles are high- current progesterone will help bring down estrogen levels. Is her spouse on testosterone? Could be transferred from him??? If not, recommend evaluation with endocrinologist to get better idea why these are all high. MRI was negative and it would be accurate.

## 2014-10-31 NOTE — Telephone Encounter (Signed)
Called pt gave her all the info that MNB addressed, pt would like referral for Endo asap All records faxed for appt

## 2014-11-06 ENCOUNTER — Telehealth: Payer: Self-pay | Admitting: Family Medicine

## 2014-11-06 NOTE — Telephone Encounter (Signed)
[facility] Day - Client TELEPHONE ADVICE RECORD   [facility]     Patient Name: [facility] President Initial Comment Caller states she has had tingling in hands, feet, and legs for 9 months and getting worse. Rectal bleeding today when wiping.  DOB: 08/03/1970      Nurse Assessment  Nurse: Lucianne Lei, RN, Lanora Manis Date/Time (Eastern Time): 11/06/2014 4:32:36 PM  Confirm and document reason for call. If symptomatic, describe symptoms. ---Patient states she has tingling in her hands, legs and feet for 9 months. States she has seen three Neurologists and has had an MRI of her head and neck and they can't find the problem. States she has an appointment with an Endocrinologist on Monday. Patient wants an appointment with her PCP to have blood test drawn before she sees the Endocrinologist. Patient states she has rectal bleeding. She noticed blood on the toilet paper when she wipes three times today.  Has the patient traveled out of the country within the last 30 days? ---Not Applicable  Does the patient have any new or worsening symptoms? ---Yes  Will a triage be completed? ---Yes  Related visit to physician within the last 2 weeks? ---No  Does the PT have any chronic conditions? (i.e. diabetes, asthma, etc.) ---Yes  List chronic conditions. ---PMS  Did the patient indicate they were pregnant? ---No    Guidelines     Guideline Title Affirmed Question Affirmed Notes   Rectal Bleeding MILD rectal bleeding (more than just a few drops or streaks)    Final Disposition User   See PCP When Office is Open (within 3 days) [place], RN, Lanora Manis       Disagree/Comply: Comply

## 2014-11-07 NOTE — Telephone Encounter (Signed)
Pt has appt with Dr Para March 11/08/14 at 10:30.

## 2014-11-07 NOTE — Telephone Encounter (Signed)
Will see tomorrow

## 2014-11-08 ENCOUNTER — Ambulatory Visit: Payer: Self-pay | Admitting: Family Medicine

## 2014-11-11 ENCOUNTER — Ambulatory Visit (INDEPENDENT_AMBULATORY_CARE_PROVIDER_SITE_OTHER): Payer: BC Managed Care – PPO | Admitting: Endocrinology

## 2014-11-11 ENCOUNTER — Encounter: Payer: Self-pay | Admitting: Endocrinology

## 2014-11-11 VITALS — BP 122/78 | HR 61 | Temp 97.5°F

## 2014-11-11 DIAGNOSIS — N921 Excessive and frequent menstruation with irregular cycle: Secondary | ICD-10-CM

## 2014-11-11 DIAGNOSIS — R7989 Other specified abnormal findings of blood chemistry: Secondary | ICD-10-CM

## 2014-11-11 DIAGNOSIS — E270 Other adrenocortical overactivity: Secondary | ICD-10-CM | POA: Diagnosis not present

## 2014-11-11 MED ORDER — DEXAMETHASONE 1 MG PO TABS
ORAL_TABLET | ORAL | Status: DC
Start: 2014-11-11 — End: 2015-10-21

## 2014-11-11 NOTE — Progress Notes (Signed)
Subjective:    Patient ID: Brittany Sanchez, female    DOB: February 23, 1970, 44 y.o.   MRN: [id number]  HPI Pt states 8 mos of moderate tingling of the hands and feet, and assoc mood swings.  sxs are worse at night, and during menstruation.  She is G3P2.  She has minimal menstruation since endometrial ablation in 2014.  She has seen multiple doctors for these symptoms.  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   Past Medical History  Diagnosis Date  . PMS (premenstrual syndrome)     Past Surgical History  Procedure Laterality Date  . Breast enhancement surgery      Social History   Social History  . Marital Status: Married    Spouse Name: N/A  . Number of Children: 2  . Years of Education: N/A   Occupational History  . 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 Smoking status: Never Smoker   . Smokeless tobacco: Not on file  . Alcohol Use: 0.0 oz/week    0 Standard drinks or equivalent per week     Comment: rare  . Drug Use: No  . Sexual Activity:    Partners: Male    Birth Control/ Protection: Rhythm   Other Topics Concern  . Not on file   Social History Narrative   Gym 3-4 times a week.     Lives with husband in a two story home.  Has 2 children.    Teaches middle school, ESL.     Highest level of education:  Masters    Current Outpatient Prescriptions on File Prior to Visit  Medication Sig Dispense Refill  . FLUoxetine (PROZAC) 20 MG capsule Take 1 capsule (20 mg total) by mouth daily. (Patient taking differently: Take 40 mg by mouth daily. ) 30 capsule 3  . lamoTRIgine (LAMICTAL) 100 MG tablet Take 100 mg by mouth daily.    . meloxicam (MOBIC) 15 MG tablet Take 15 mg by mouth as needed for pain.    . progesterone (PROMETRIUM) 100 MG capsule Take 100 mg by mouth daily. Take 2 tablets at night    . methocarbamol (ROBAXIN) 500 MG tablet Take 500 mg by mouth 4 (four) times daily.    . traMADol (ULTRAM) 50 MG tablet  Take by mouth every 6 (six) hours as needed.    . traZODone (DESYREL) 50 MG tablet Take 50 mg by mouth at bedtime.     No current facility-administered medications on file prior to visit.    No Known Allergies  Family History  Problem Relation Age of Onset  . Cancer Mother 37    mesothelioma  . Sleep apnea Brother   . Cancer Maternal Grandmother     ? ovarian  . Diabetes Maternal Grandfather   . Alcohol abuse Other   . Depression Other   . Cancer Father 50    mesothelioma    BP 122/78 mmHg  Pulse 61  Temp(Src) 97.5 F (36.4 C) (Oral)  SpO2 91%  Review of Systems denies weight gain, sob, cramps, muscle weakness, and cold intolerance.  She has insomnia, urinary frequency, intermittent excessive diaphoresis, tremor, depression, easy bruising, headache, and fatigue.  She has slight bilateral galactorrhea.      Objective:   Physical Exam VS: see vs page GEN: no distress HEAD: head: no deformity eyes: no periorbital swelling, no proptosis external nose and ears are normal mouth: no lesion seen NECK: supple,  thyroid is not enlarged CHEST WALL: no deformity LUNGS:  Clear to auscultation CV: reg rate and rhythm, no murmur ABD: abdomen is soft, nontender.  no hepatosplenomegaly.  not distended.  no hernia MUSCULOSKELETAL: muscle bulk and strength are grossly normal.  no obvious joint swelling.  gait is normal and steady EXTEMITIES: no deformity.  no edema NEURO:  cn 2-12 grossly intact.   readily moves all 4's.  sensation is intact to touch on all 4's.  Slight tremor of the hands.  SKIN:  Normal texture and temperature.  No rash or suspicious lesion is visible.   NODES:  None palpable at the neck.  PSYCH: alert, well-oriented.  Tearful.    outside test results are reviewed: Salivary testosterone and cortisol are high Total testosterone=8 Lab Results  Component Value Date   TSH 1.790 09/24/2014   T4TOTAL 7.2 09/24/2014    I have reviewed outside records, and  summarized: Pt has been eval for above sxs, but w/u has been neg.  However, prozac works well.      Assessment & Plan:  Acral paresthesias, new to me, uncertain etiology.  No endocrine cause is likely.  Galactorrhea.  She declines rx for this Tremor, uncertain etiology.  Patient is advised the following: Patient Instructions  you should do a "dexamethasone suppression test."  for this, you would take dexamethasone 1 mg at 10 pm, then come in for a "cortisol" blood test the next morning before 9 am.  you do not need to be fasting for this test.

## 2014-11-11 NOTE — Patient Instructions (Addendum)
you should do a "dexamethasone suppression test."  for this, you would take dexamethasone 1 mg at 10 pm, then come in for a "cortisol" blood test the next morning before 9 am.  you do not need to be fasting for this test.  

## 2014-11-12 ENCOUNTER — Encounter: Payer: Self-pay | Admitting: Endocrinology

## 2014-11-18 ENCOUNTER — Encounter: Payer: Self-pay | Admitting: Primary Care

## 2014-11-18 ENCOUNTER — Ambulatory Visit (INDEPENDENT_AMBULATORY_CARE_PROVIDER_SITE_OTHER): Payer: BC Managed Care – PPO | Admitting: Primary Care

## 2014-11-18 VITALS — BP 116/72 | HR 59 | Temp 97.5°F | Wt 135.8 lb

## 2014-11-18 DIAGNOSIS — R6889 Other general symptoms and signs: Secondary | ICD-10-CM | POA: Diagnosis not present

## 2014-11-18 NOTE — Patient Instructions (Signed)
Your flu test is negative.  Increase consumption of fluids. You may take tylenol or ibuprofen for fevers, sore throat, and/or body aches; Flonase nasal spray for nasal congestion; Mucinex for chest congestion; Delsym or Robitussin for cough. These medications may be purchased over the counter.  Please call me if no improvement in symptoms by Friday this week.   It was a pleasure meeting you!  Upper Respiratory Infection, Adult Most upper respiratory infections (URIs) are a viral infection of the air passages leading to the lungs. A URI affects the nose, throat, and upper air passages. The most common type of URI is nasopharyngitis and is typically referred to as "the common cold." URIs run their course and usually go away on their own. Most of the time, a URI does not require medical attention, but sometimes a bacterial infection in the upper airways can follow a viral infection. This is called a secondary infection. Sinus and middle ear infections are common types of secondary upper respiratory infections. Bacterial pneumonia can also complicate a URI. A URI can worsen asthma and chronic obstructive pulmonary disease (COPD). Sometimes, these complications can require emergency medical care and may be life threatening.  CAUSES Almost all URIs are caused by viruses. A virus is a type of germ and can spread from one person to another.  RISKS FACTORS You may be at risk for a URI if:   You smoke.   You have chronic heart or lung disease.  You have a weakened defense (immune) system.   You are very young or very old.   You have nasal allergies or asthma.  You work in crowded or poorly ventilated areas.  You work in health care facilities or schools. SIGNS AND SYMPTOMS  Symptoms typically develop 2-3 days after you come in contact with a cold virus. Most viral URIs last 7-10 days. However, viral URIs from the influenza virus (flu virus) can last 14-18 days and are typically more severe.  Symptoms may include:   Runny or stuffy (congested) nose.   Sneezing.   Cough.   Sore throat.   Headache.   Fatigue.   Fever.   Loss of appetite.   Pain in your forehead, behind your eyes, and over your cheekbones (sinus pain).  Muscle aches.  DIAGNOSIS  Your health care provider may diagnose a URI by:  Physical exam.  Tests to check that your symptoms are not due to another condition such as:  Strep throat.  Sinusitis.  Pneumonia.  Asthma. TREATMENT  A URI goes away on its own with time. It cannot be cured with medicines, but medicines may be prescribed or recommended to relieve symptoms. Medicines may help:  Reduce your fever.  Reduce your cough.  Relieve nasal congestion. HOME CARE INSTRUCTIONS   Take medicines only as directed by your health care provider.   Gargle warm saltwater or take cough drops to comfort your throat as directed by your health care provider.  Use a warm mist humidifier or inhale steam from a shower to increase air moisture. This may make it easier to breathe.  Drink enough fluid to keep your urine clear or pale yellow.   Eat soups and other clear broths and maintain good nutrition.   Rest as needed.   Return to work when your temperature has returned to normal or as your health care provider advises. You may need to stay home longer to avoid infecting others. You can also use a face mask and careful hand washing to prevent  spread of the virus.  Increase the usage of your inhaler if you have asthma.   Do not use any tobacco products, including cigarettes, chewing tobacco, or electronic cigarettes. If you need help quitting, ask your health care provider. PREVENTION  The best way to protect yourself from getting a cold is to practice good hygiene.   Avoid oral or hand contact with people with cold symptoms.   Wash your hands often if contact occurs.  There is no clear evidence that vitamin C, vitamin E,  echinacea, or exercise reduces the chance of developing a cold. However, it is always recommended to get plenty of rest, exercise, and practice good nutrition.  SEEK MEDICAL CARE IF:   You are getting worse rather than better.   Your symptoms are not controlled by medicine.   You have chills.  You have worsening shortness of breath.  You have brown or red mucus.  You have yellow or brown nasal discharge.  You have pain in your face, especially when you bend forward.  You have a fever.  You have swollen neck glands.  You have pain while swallowing.  You have white areas in the back of your throat. SEEK IMMEDIATE MEDICAL CARE IF:   You have severe or persistent:  Headache.  Ear pain.  Sinus pain.  Chest pain.  You have chronic lung disease and any of the following:  Wheezing.  Prolonged cough.  Coughing up blood.  A change in your usual mucus.  You have a stiff neck.  You have changes in your:  Vision.  Hearing.  Thinking.  Mood. MAKE SURE YOU:   Understand these instructions.  Will watch your condition.  Will get help right away if you are not doing well or get worse.   This information is not intended to replace advice given to you by your health care provider. Make sure you discuss any questions you have with your health care provider.   Document Released: 07/14/2000 Document Revised: 06/04/2014 Document Reviewed: 04/25/2013 Elsevier Interactive Patient Education [employer]

## 2014-11-18 NOTE — Progress Notes (Signed)
Subjective:    Patient ID: Brittany Sanchez, female    DOB: 03-09-1970, 44 y.o.   MRN: [id number]  HPI  Brittany Sanchez is a 44 year old female who presents today with a chief complaint of sudden onset of fatigue/tiredness. She also reports fevers, cough, shortness of breath, sore throat. Her symptoms have been present for the past 3 days. She's taken Dayquil and another OTC medication without much relief. Her worse symptom is a feeling of being drained/fatigued. She has not have the influenza vaccination this season.  Review of Systems  Constitutional: Positive for chills and fatigue. Negative for fever.  HENT: Positive for congestion and sore throat. Negative for ear pain, postnasal drip and sinus pressure.   Respiratory: Positive for cough.   Musculoskeletal: Positive for myalgias.       Past Medical History  Diagnosis Date  . PMS (premenstrual syndrome)     Social History   Social History  . Marital Status: Married    Spouse Name: N/A  . Number of Children: 2  . Years of Education: N/A   Occupational History  . 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 Smoking status: Never Smoker   . Smokeless tobacco: Not on file  . Alcohol Use: 0.0 oz/week    0 Standard drinks or equivalent per week     Comment: rare  . Drug Use: No  . Sexual Activity:    Partners: Male    Birth Control/ Protection: Rhythm   Other Topics Concern  . Not on file   Social History Narrative   Gym 3-4 times a week.     Lives with husband in a two story home.  Has 2 children.    Teaches middle school, ESL.     Highest level of education:  Masters    Past Surgical History  Procedure Laterality Date  . Breast enhancement surgery      Family History  Problem Relation Age of Onset  . Cancer Mother 44    mesothelioma  . Sleep apnea Brother   . Cancer Maternal Grandmother     ? ovarian  . Diabetes Maternal Grandfather   . Alcohol abuse Other   . Depression Other   .  Cancer Father 63    mesothelioma    No Known Allergies  Current Outpatient Prescriptions on File Prior to Visit  Medication Sig Dispense Refill  . FLUoxetine (PROZAC) 20 MG capsule Take 1 capsule (20 mg total) by mouth daily. (Patient taking differently: Take 40 mg by mouth daily. ) 30 capsule 3  . lamoTRIgine (LAMICTAL) 100 MG tablet Take 100 mg by mouth daily.    . progesterone (PROMETRIUM) 100 MG capsule Take 100 mg by mouth daily. Take 2 tablets at night    . traZODone (DESYREL) 50 MG tablet Take 50 mg by mouth at bedtime.    Marland Kitchen dexamethasone (DECADRON) 1 MG tablet Take at 9-10 pm, the night before blood test (Patient not taking: Reported on 11/18/2014) 1 tablet 0  . meloxicam (MOBIC) 15 MG tablet Take 15 mg by mouth as needed for pain.    Marland Kitchen zolpidem (AMBIEN) 10 MG tablet START WITH 1/2 TABLET BY MOUTH AT BEDTIME AND CAN INCREASE TO 1 FULL TABLET AT BEDTIME  0   No current facility-administered medications on file prior to visit.    BP 116/72 mmHg  Pulse 59  Temp(Src) 97.5 F (36.4 C) (Oral)  Wt 135 lb 12.8 oz (61.598 kg)  SpO2 99%    Objective:   Physical Exam  Constitutional: She appears well-nourished. She appears ill.  HENT:  Right Ear: Tympanic membrane and ear canal normal.  Left Ear: Tympanic membrane and ear canal normal.  Nose: Right sinus exhibits no maxillary sinus tenderness and no frontal sinus tenderness. Left sinus exhibits no maxillary sinus tenderness and no frontal sinus tenderness.  Mouth/Throat: Oropharynx is clear and moist.  Eyes: Conjunctivae are normal. Pupils are equal, round, and reactive to light.  Neck: Neck supple.  Cardiovascular: Normal rate and regular rhythm.   Pulmonary/Chest: Effort normal and breath sounds normal.  Lymphadenopathy:    She has no cervical adenopathy.  Skin: Skin is warm and dry.          Assessment & Plan:  Flu-like symptoms:  Sudden onset of fatigue, cough, body aches, chills x 3 days ago. Exam unremarkable  with clear lungs, HENT WNL. Appears tired. Rapid Influenza Test: Negative Discussed supportive measures: Fluids, rest, Delsym, ibuprofen/tylenol, mucinex. She is to call me if no improvement Friday this week. Work note provided.

## 2014-11-18 NOTE — Progress Notes (Signed)
Pre visit review using our clinic review tool, if applicable. No additional management support is needed unless otherwise documented below in the visit note. 

## 2015-05-07 ENCOUNTER — Encounter: Payer: Self-pay | Admitting: Obstetrics and Gynecology

## 2015-05-22 ENCOUNTER — Encounter: Payer: Self-pay | Admitting: Family Medicine

## 2015-05-22 ENCOUNTER — Ambulatory Visit (INDEPENDENT_AMBULATORY_CARE_PROVIDER_SITE_OTHER): Payer: BC Managed Care – PPO | Admitting: Family Medicine

## 2015-05-22 VITALS — BP 98/60 | HR 64 | Temp 97.7°F | Ht 62.0 in | Wt 139.8 lb

## 2015-05-22 DIAGNOSIS — R51 Headache: Secondary | ICD-10-CM

## 2015-05-22 DIAGNOSIS — R52 Pain, unspecified: Secondary | ICD-10-CM

## 2015-05-22 DIAGNOSIS — R519 Headache, unspecified: Secondary | ICD-10-CM

## 2015-05-22 LAB — POCT INFLUENZA A/B
INFLUENZA A, POC: NEGATIVE
Influenza B, POC: NEGATIVE

## 2015-05-22 NOTE — Assessment & Plan Note (Signed)
?   Secondary to tension headache, not typical of migraine. ? Viral syndrome ? Allergy component.  Treat with fluids, rest, NSAIDs and trial of allergy med.

## 2015-05-22 NOTE — Progress Notes (Signed)
   Subjective:    Patient ID: Brittany Sanchez, female    DOB: 04/17/1970, 45 y.o.   MRN: [id number]  HPI 45 year old female with history of anxiety, depression, insomnia, pt of Dr. Elmer SowAron's presents with headache.  She reports she has had a headache all week, x 4 days. Posterior head. Constant severe pain. No N/V, no photo phonophobia. No fever, feels tired, no body aches. No cough, no congestion. occ dizzy spells. Slightly short of breath.  She tried dayquil and nyquil helped temporarily.  Sick contact of flu, daughter and husband last 2 weeks ago and last week. Did not take tamiflu.  History of chronic tingling and brain fog x 2 years. Tested for lyme by alternative med doc. Yesterday.  Social History /Family History/Past Medical History reviewed and updated if needed. No history of migraines.  Review of Systems  Constitutional: Negative for fever and fatigue.  HENT: Negative for ear pain.   Eyes: Negative for pain.  Respiratory: Negative for chest tightness and shortness of breath.   Cardiovascular: Negative for chest pain, palpitations and leg swelling.  Gastrointestinal: Negative for abdominal pain.  Genitourinary: Negative for dysuria.       Objective:   Physical Exam  Constitutional: Vital signs are normal. She appears well-developed and well-nourished. She is cooperative.  Non-toxic appearance. She does not appear ill. No distress.  HENT:  Head: Normocephalic.  Right Ear: Hearing, tympanic membrane, external ear and ear canal normal. Tympanic membrane is not erythematous, not retracted and not bulging.  Left Ear: Hearing, tympanic membrane, external ear and ear canal normal. Tympanic membrane is not erythematous, not retracted and not bulging.  Nose: No mucosal edema or rhinorrhea. Right sinus exhibits no maxillary sinus tenderness and no frontal sinus tenderness. Left sinus exhibits no maxillary sinus tenderness and no frontal sinus tenderness.  Mouth/Throat: Uvula is  midline, oropharynx is clear and moist and mucous membranes are normal.  Eyes: Conjunctivae, EOM and lids are normal. Pupils are equal, round, and reactive to light. Lids are everted and swept, no foreign bodies found.  Neck: Trachea normal and normal range of motion. Neck supple. Carotid bruit is not present. No thyroid mass and no thyromegaly present.  Cardiovascular: Normal rate, regular rhythm, S1 normal, S2 normal, normal heart sounds, intact distal pulses and normal pulses.  Exam reveals no gallop and no friction rub.   No murmur heard. Pulmonary/Chest: Effort normal and breath sounds normal. No tachypnea. No respiratory distress. She has no decreased breath sounds. She has no wheezes. She has no rhonchi. She has no rales.  Abdominal: Soft. Normal appearance and bowel sounds are normal. There is no tenderness.  Neurological: She is alert.  Skin: Skin is warm, dry and intact. No rash noted.  Psychiatric: Her speech is normal and behavior is normal. Judgment and thought content normal. Her mood appears not anxious. Cognition and memory are normal. She does not exhibit a depressed mood.          Assessment & Plan:

## 2015-05-22 NOTE — Patient Instructions (Addendum)
Flu test negative. Fluids, rest.  Try ibuprofen 800 mg every eight hours as needed for headache.  Can start zyrtec at night for allergies.  If not improving follow up with PCP.

## 2015-07-15 ENCOUNTER — Encounter: Payer: Self-pay | Admitting: Obstetrics and Gynecology

## 2015-07-23 ENCOUNTER — Encounter: Payer: Self-pay | Admitting: Obstetrics and Gynecology

## 2015-10-21 ENCOUNTER — Other Ambulatory Visit: Payer: Self-pay | Admitting: Family Medicine

## 2015-10-21 ENCOUNTER — Ambulatory Visit (INDEPENDENT_AMBULATORY_CARE_PROVIDER_SITE_OTHER): Payer: BC Managed Care – PPO | Admitting: Family Medicine

## 2015-10-21 ENCOUNTER — Encounter: Payer: Self-pay | Admitting: Family Medicine

## 2015-10-21 VITALS — BP 88/60 | HR 66 | Temp 98.0°F | Wt 135.2 lb

## 2015-10-21 DIAGNOSIS — Z1211 Encounter for screening for malignant neoplasm of colon: Secondary | ICD-10-CM

## 2015-10-21 DIAGNOSIS — Z800 Family history of malignant neoplasm of digestive organs: Secondary | ICD-10-CM | POA: Diagnosis not present

## 2015-10-21 MED ORDER — FLUOXETINE HCL 20 MG PO CAPS: 40.0000 mg | ORAL_CAPSULE | ORAL | 3 refills | Status: DC | PRN

## 2015-10-21 MED ORDER — FLUOXETINE HCL 20 MG PO CAPS: 40.0000 mg | ORAL_CAPSULE | ORAL | Status: DC | PRN

## 2015-10-21 NOTE — Patient Instructions (Signed)
Shirlee LimerickMarion will call about your referral. Go see her on the way out.  Take care.  Glad to see you.

## 2015-10-21 NOTE — Progress Notes (Signed)
She had been taking ambient 5mg  at start of sleep then 10mg  later in the night.    She was asking about refill of SSRI.  Defer SSRI refill to PCP.    She is reported treated by outside MD for lyme disease.  I will defer to outside MD and PCP.  I don't have any records about her Lyme disease diagnosis.  Rectal pain/pressure sensation.  Ongoing for months.  Only with sitting.  No pain standing.  Mother had h/o colon cancer, dx'd [id number].  No blood in stool.  She has some mucous in stool, but not with every BM.  No fevers, no chills, no vomiting.  Still with normal caliber BMs.  H/o episodic hemorrhoid flare.    Meds, vitals, and allergies reviewed.   ROS: Per HPI unless specifically indicated in ROS section   No apparent distress. rrr ctab abd soft, normal bs, not ttp Rectal exam deferred as it would not change management. Discussed with patient. She agrees.

## 2015-10-21 NOTE — Progress Notes (Signed)
Pre visit review using our clinic review tool, if applicable. No additional management support is needed unless otherwise documented below in the visit note. 

## 2015-10-22 DIAGNOSIS — Z800 Family history of malignant neoplasm of digestive organs: Secondary | ICD-10-CM | POA: Insufficient documentation

## 2015-10-22 NOTE — Assessment & Plan Note (Signed)
Regardless of any findings, or absence of findings, on rectal exam we would still refer her to GI due to her family history of colon cancer. I put in referral. Discussed with patient. She agrees.  >15 minutes spent in face to face time with patient, >50% spent in counselling or coordination of care.   I will defer her other issues, her reported Lyme diagnosis, her insomnia, and her SSRI refill to her primary care provider.  Routed to PCP as FYI.

## 2015-11-03 ENCOUNTER — Ambulatory Visit: Payer: BC Managed Care – PPO | Admitting: Gastroenterology

## 2015-12-17 ENCOUNTER — Ambulatory Visit: Payer: BC Managed Care – PPO | Admitting: Gastroenterology

## 2016-06-25 ENCOUNTER — Other Ambulatory Visit: Payer: Self-pay | Admitting: Family Medicine

## 2016-06-25 ENCOUNTER — Ambulatory Visit (INDEPENDENT_AMBULATORY_CARE_PROVIDER_SITE_OTHER)
Admission: RE | Admit: 2016-06-25 | Discharge: 2016-06-25 | Disposition: A | Discharge status: Home / Self Care | Payer: BC Managed Care – PPO | Source: Ambulatory Visit | Attending: Family Medicine | Admitting: Family Medicine

## 2016-06-25 ENCOUNTER — Ambulatory Visit (INDEPENDENT_AMBULATORY_CARE_PROVIDER_SITE_OTHER): Payer: BC Managed Care – PPO | Admitting: Family Medicine

## 2016-06-25 ENCOUNTER — Encounter: Payer: Self-pay | Admitting: Family Medicine

## 2016-06-25 VITALS — BP 112/62 | HR 69 | Temp 97.8°F | Ht 63.0 in | Wt 140.1 lb

## 2016-06-25 DIAGNOSIS — J40 Bronchitis, not specified as acute or chronic: Secondary | ICD-10-CM | POA: Diagnosis not present

## 2016-06-25 DIAGNOSIS — R05 Cough: Secondary | ICD-10-CM | POA: Diagnosis not present

## 2016-06-25 DIAGNOSIS — R059 Cough, unspecified: Secondary | ICD-10-CM

## 2016-06-25 MED ORDER — AZITHROMYCIN 250 MG PO TABS: ORAL_TABLET | ORAL | 0 refills | Status: DC

## 2016-06-25 MED ORDER — AZITHROMYCIN 250 MG PO TABS: 250.0000 mg | ORAL_TABLET | Freq: Every day | ORAL | 0 refills | Status: DC

## 2016-06-25 NOTE — Progress Notes (Signed)
Pre visit review using our clinic review tool, if applicable. No additional management support is needed unless otherwise documented below in the visit note. 

## 2016-06-25 NOTE — Patient Instructions (Signed)

## 2016-06-25 NOTE — Progress Notes (Signed)
Patient ID: Brittany Sanchez, female    DOB: March 11, 1970  Age: 46 y.o. MRN: [id number]    Subjective:  Subjective  HPI Brittany Sanchez presents for cough ,  Not productive  Pt feels fatigued.  Cough is worse during the day.  No fever.  Symptoms came on quickly 2 days ago.    Review of Systems  Constitutional: Negative for chills and fever.  HENT: Negative for congestion, ear pain, postnasal drip, rhinorrhea, sinus pressure and sore throat.   Respiratory: Positive for cough, chest tightness and shortness of breath. Negative for wheezing.   Cardiovascular: Negative for chest pain, palpitations and leg swelling.  Allergic/Immunologic: Negative for environmental allergies.    History Past Medical History:  Diagnosis Date  . PMS (premenstrual syndrome)     She has a past surgical history that includes Breast enhancement surgery.   Her family history includes Alcohol abuse in her other; Cancer in her maternal grandmother; Cancer (age of onset: 18) in her father; Cancer (age of onset: 70) in her mother; Depression in her other; Diabetes in her maternal grandfather; Sleep apnea in her brother.She reports that she has never smoked. She does not have any smokeless tobacco history on file. She reports that she drinks alcohol. She reports that she does not use drugs.  Current Outpatient Prescriptions on File Prior to Visit  Medication Sig Dispense Refill  . FLUoxetine (PROZAC) 20 MG capsule Take 2 capsules (40 mg total) by mouth as needed. 60 capsule 3  . progesterone (PROMETRIUM) 100 MG capsule Take 100 mg by mouth daily. Take 2 tablets at night     No current facility-administered medications on file prior to visit.      Objective:  Objective  Physical Exam  Constitutional: She is oriented to person, place, and time. She appears well-developed and well-nourished.  HENT:  Right Ear: External ear normal.  Left Ear: External ear normal.  + PND + errythema  Eyes: Conjunctivae are normal. Right  eye exhibits no discharge. Left eye exhibits no discharge.  Cardiovascular: Normal rate, regular rhythm and normal heart sounds.   No murmur heard. Pulmonary/Chest: Effort normal. No respiratory distress. She has decreased breath sounds. She has no wheezes. She has no rales.     She exhibits no tenderness.  Musculoskeletal: She exhibits no edema.  Lymphadenopathy:    She has cervical adenopathy.  Neurological: She is alert and oriented to person, place, and time.  Nursing note and vitals reviewed.  BP 112/62 (BP Location: Left Arm, Patient Position: Sitting, Cuff Size: Normal)   Pulse 69   Temp 97.8 F (36.6 C) (Oral)   Ht 5\' 3"  (1.6 m)   Wt 140 lb 2 oz (63.6 kg)   SpO2 98%   BMI 24.82 kg/m  Wt Readings from Last 3 Encounters:  06/25/16 140 lb 2 oz (63.6 kg)  10/21/15 135 lb 4 oz (61.3 kg)  05/22/15 139 lb 12.8 oz (63.4 kg)     Lab Results  Component Value Date   WBC 4.4 06/13/2014   HGB 13.6 06/13/2014   HCT 39.9 06/13/2014   PLT 198.0 06/13/2014   GLUCOSE 84 09/24/2014   CHOL 149 03/21/2014   TRIG 82.0 03/21/2014   HDL 51.90 03/21/2014   LDLCALC 81 03/21/2014   ALT 16 09/24/2014   AST 23 09/24/2014   NA 138 09/24/2014   K 3.8 09/24/2014   CL 98 09/24/2014   CREATININE 1.22 (H) 09/24/2014   BUN 24 09/24/2014   CO2 25 09/24/2014  TSH 1.790 09/24/2014   HGBA1C 5.4 06/13/2014    Dg Hand Complete Left  Result Date: 08/23/2014 CLINICAL DATA:  Pain.  No history of trauma. EXAM: LEFT HAND - COMPLETE 3+ VIEW COMPARISON:  None. FINDINGS: Frontal, oblique, and lateral views obtained. There is no fracture or dislocation. Joint spaces appear intact. No erosive change. IMPRESSION: No fracture or dislocation.  No appreciable arthropathy. Electronically Signed   By: Bretta BangWilliam  Woodruff III M.D.   On: 08/23/2014 15:29   Dg Hand Complete Right  Result Date: 08/23/2014 CLINICAL DATA:  Hand pain EXAM: RIGHT HAND - COMPLETE 3+ VIEW COMPARISON:  None. FINDINGS: There is no  evidence of fracture or dislocation. There is no evidence of arthropathy or other focal bone abnormality. Soft tissues are unremarkable. IMPRESSION: Negative. Electronically Signed   By: Christiana PellantGretchen  Green M.D.   On: 08/23/2014 15:31     Assessment & Plan:  Plan  I have discontinued Ms. Hixon's zolpidem. I am also having her start on azithromycin. Additionally, I am having her maintain her progesterone and FLUoxetine.  Meds ordered this encounter  Medications  . azithromycin (ZITHROMAX Z-PAK) 250 MG tablet    Sig: As directed    Dispense:  6 each    Refill:  0    Problem List Items Addressed This Visit    None    Visit Diagnoses    Bronchitis    -  Primary   Relevant Medications   azithromycin (ZITHROMAX Z-PAK) 250 MG tablet   Other Relevant Orders   DG Chest 2 View   Cough       Relevant Orders   DG Chest 2 View    use mucinex, delsym otc for cough Tylenol / advil prn cxr at [facility]--- rto prn   Follow-up: Return if symptoms worsen or fail to improve.  Donato SchultzYvonne R Lowne Chase, DO

## 2016-06-29 ENCOUNTER — Telehealth: Payer: Self-pay | Admitting: Family Medicine

## 2016-06-29 MED ORDER — PROMETHAZINE-DM 6.25-15 MG/5ML PO SYRP: 5.0000 mL | ORAL_SOLUTION | Freq: Four times a day (QID) | ORAL | 0 refills | Status: DC | PRN

## 2016-06-29 NOTE — Telephone Encounter (Signed)
Patient states that we sent in another zpak.  She states that last night she is coughing quite a bit, feels dizzy, hard time breathing and wheezing.  She took a benadryl which helped and she feels a little better this morning.    Per your note we were to send in one more day and not another zpak.  So is it one dose of 250mg  or 500mg ?  Also she would like something for the cough.

## 2016-06-29 NOTE — Telephone Encounter (Signed)
Patient notified

## 2016-06-29 NOTE — Telephone Encounter (Signed)
Caller name: Comer LocketMarcy Satre Relationship to patient: self Can be reached: [phone] Pharmacy: [id number] [place] - [address]   Reason for call: above phamacy for today only  - pt states she was told if she had pneumonia from xray zpack would be changed to something else but another zpack was called in. Pt confused. Please contact her. She is still having coughin and wheezing spells.

## 2016-06-29 NOTE — Telephone Encounter (Signed)
Phenergan dm 1 tsp qid prn cough May make her sleepy  Brittany Sanchez told me she had already picked up the z pak so I called in the second one to extend the treatment --- with the second pack just take 1 a day

## 2016-08-19 ENCOUNTER — Other Ambulatory Visit: Payer: Self-pay

## 2016-08-19 MED ORDER — FLUOXETINE HCL 20 MG PO CAPS: 40.0000 mg | ORAL_CAPSULE | ORAL | 3 refills | Status: DC | PRN

## 2016-08-19 NOTE — Telephone Encounter (Signed)
Last refill 10/21/15 Last OV 10/21/15 Ok to refill?   

## 2016-09-22 ENCOUNTER — Encounter: Payer: Self-pay | Admitting: Obstetrics and Gynecology

## 2016-09-22 ENCOUNTER — Ambulatory Visit (INDEPENDENT_AMBULATORY_CARE_PROVIDER_SITE_OTHER): Payer: BC Managed Care – PPO | Admitting: Obstetrics and Gynecology

## 2016-09-22 ENCOUNTER — Other Ambulatory Visit: Payer: Self-pay | Admitting: Obstetrics and Gynecology

## 2016-09-22 VITALS — BP 108/69 | HR 65 | Ht 62.5 in | Wt 141.5 lb

## 2016-09-22 DIAGNOSIS — E538 Deficiency of other specified B group vitamins: Secondary | ICD-10-CM | POA: Diagnosis not present

## 2016-09-22 DIAGNOSIS — Z01419 Encounter for gynecological examination (general) (routine) without abnormal findings: Secondary | ICD-10-CM | POA: Diagnosis not present

## 2016-09-22 LAB — HM PAP SMEAR: HM Pap smear: NORMAL

## 2016-09-22 MED ORDER — FLUOXETINE HCL 20 MG PO CAPS: 60.0000 mg | ORAL_CAPSULE | Freq: Every day | ORAL | 6 refills | Status: DC

## 2016-09-22 MED ORDER — ALPRAZOLAM 0.5 MG PO TABS: 0.5000 mg | ORAL_TABLET | Freq: Three times a day (TID) | ORAL | 2 refills | Status: DC | PRN

## 2016-09-22 NOTE — Patient Instructions (Signed)
Thank you for enrolling in MyChart. Please follow the instructions below to securely access your online medical record. MyChart allows you to send messages to your doctor, view your test results, renew your prescriptions, schedule appointments, and more.  How Do I Sign Up? 1. In your Internet browser, go to [email or web address] 2. Click on the New  User? link in the Sign In box.  3. Enter your MyChart Access Code exactly as it appears below. You will not need to use this code after you have completed the sign-up process. If you do not sign up before the expiration date, you must request a new code. MyChart Access Code: [email or web address] Expires: 11/21/2016  9:02 AM  4. Enter the last four digits of your Social Security Number (xxxx) and Date of Birth (mm/dd/yyyy) as indicated and click Next. You will be taken to the next sign-up page. 5. Create a MyChart ID. This will be your MyChart login ID and cannot be changed, so think of one that is secure and easy to remember. 6. Create a MyChart password. You can change your password at any time. 7. Enter your Password Reset Question and Answer and click Next. This can be used at a later time if you forget your password.  8. Select your communication preference, and if applicable enter your e-mail address. You will receive e-mail notification when new information is available in MyChart by choosing to receive e-mail notifications and filling in your e-mail. 9. Click Sign In. You can now view your medical record.   Additional Information If you have questions, you can email [email or web address] or call [phone] to talk to our MyChart staff. Remember, MyChart is NOT to be used for urgent needs. For medical emergencies, dial 911.

## 2016-09-22 NOTE — Progress Notes (Signed)
Subjective:   Brittany Sanchez is a 46 y.o. G69P2002 Caucasian female here for a routine well-woman exam.  No LMP recorded. Patient has had an ablation.    Current complaints: slow weight gain over last few years still exercising and eating well. PCP: Dayton Martes       does desire labs  Social History: Sexual: heterosexual Marital Status: married Living situation: with spouse Occupation: Runner, broadcasting/film/video at [employer] Tobacco/alcohol: no tobacco use Illicit drugs: no history of illicit drug use  The following portions of the patient's history were reviewed and updated as appropriate: allergies, current medications, past family history, past medical history, past social history, past surgical history and problem list.  Past Medical History Past Medical History:  Diagnosis Date  . Lyme disease   . PMS (premenstrual syndrome)     Past Surgical History Past Surgical History:  Procedure Laterality Date  . BREAST ENHANCEMENT SURGERY      Gynecologic History G2P2002  No LMP recorded. Patient has had an ablation. Contraception: s/p ablation in 2015 Last Pap: 2013. Results were: normal Last mammogram: 2013. Results were: normal   Obstetric History OB History  Gravida Para Term Preterm AB Living  2 2 2     2   SAB TAB Ectopic Multiple Live Births          2    # Outcome Date GA Lbr Len/2nd Weight Sex Delivery Anes PTL Lv  2 Term      Vag-Spont   LIV  1 Term      Vag-Spont   LIV      Current Medications Current Outpatient Prescriptions on File Prior to Visit  Medication Sig Dispense Refill  . FLUoxetine (PROZAC) 20 MG capsule Take 2 capsules (40 mg total) by mouth as needed. 60 capsule 3  . progesterone (PROMETRIUM) 100 MG capsule Take 100 mg by mouth daily. Take 2 tablets at night    . promethazine-dextromethorphan (PROMETHAZINE-DM) 6.25-15 MG/5ML syrup Take 5 mLs by mouth 4 (four) times daily as needed for cough. (Patient not taking: Reported on 09/22/2016) 118 mL 0   No current  facility-administered medications on file prior to visit.     Review of Systems Patient denies any headaches, blurred vision, shortness of breath, chest pain, abdominal pain, problems with bowel movements, urination, or intercourse.  Objective:  BP 108/69   Pulse 65   Ht 5' 2.5" (1.588 m)   Wt 141 lb 8 oz (64.2 kg)   BMI 25.47 kg/m  Physical Exam  General:  Well developed, well nourished, no acute distress. She is alert and oriented x3. Skin:  Warm and dry Neck:  Midline trachea, no thyromegaly or nodules Cardiovascular: Regular rate and rhythm, no murmur heard Lungs:  Effort normal, all lung fields clear to auscultation bilaterally Breasts:  No dominant palpable mass, retraction, or nipple discharge Abdomen:  Soft, non tender, no hepatosplenomegaly or masses Pelvic:  External genitalia is normal in appearance.  The vagina is normal in appearance. The cervix is bulbous, no CMT.  Thin prep pap is done with HR HPV cotesting. Uterus is felt to be normal size, shape, and contour.  No adnexal masses or tenderness noted. Extremities:  No swelling or varicosities noted Psych:  She has a normal mood and affect  Assessment:   Healthy well-woman exam PMDD  Plan:  Labs obtained will follow up accordingly F/U 1 year for AE, or sooner if needed Mammogram ordered  Melody Suzan Nailer, CNM

## 2016-09-23 LAB — CYTOLOGY - PAP

## 2016-09-25 LAB — CBC
Hematocrit: 40.7 % (ref 34.0–46.6)
Hemoglobin: 13.8 g/dL (ref 11.1–15.9)
MCH: 31.2 pg (ref 26.6–33.0)
MCHC: 33.9 g/dL (ref 31.5–35.7)
MCV: 92 fL (ref 79–97)
PLATELETS: 256 10*3/uL (ref 150–379)
RBC: 4.43 x10E6/uL (ref 3.77–5.28)
RDW: 13.3 % (ref 12.3–15.4)
WBC: 8.5 10*3/uL (ref 3.4–10.8)

## 2016-09-25 LAB — COMPREHENSIVE METABOLIC PANEL
A/G RATIO: 2 (ref 1.2–2.2)
ALK PHOS: 46 IU/L (ref 39–117)
ALT: 21 IU/L (ref 0–32)
AST: 30 IU/L (ref 0–40)
Albumin: 4.4 g/dL (ref 3.5–5.5)
BUN / CREAT RATIO: 11 (ref 9–23)
BUN: 11 mg/dL (ref 6–24)
Bilirubin Total: 0.5 mg/dL (ref 0.0–1.2)
CO2: 24 mmol/L (ref 20–29)
Calcium: 9.7 mg/dL (ref 8.7–10.2)
Chloride: 99 mmol/L (ref 96–106)
Creatinine, Ser: 1.04 mg/dL — ABNORMAL HIGH (ref 0.57–1.00)
GFR calc Af Amer: 74 mL/min/{1.73_m2} (ref >59)
GFR calc non Af Amer: 65 mL/min/{1.73_m2} (ref >59)
Globulin, Total: 2.2 g/dL (ref 1.5–4.5)
Glucose: 77 mg/dL (ref 65–99)
POTASSIUM: 4.4 mmol/L (ref 3.5–5.2)
SODIUM: 140 mmol/L (ref 134–144)
Total Protein: 6.6 g/dL (ref 6.0–8.5)

## 2016-09-25 LAB — PROGESTERONE: PROGESTERONE: 3.6 ng/mL

## 2016-09-25 LAB — LIPID PANEL
CHOLESTEROL TOTAL: 136 mg/dL (ref 100–199)
Chol/HDL Ratio: 2.4 ratio (ref 0.0–4.4)
HDL: 56 mg/dL (ref >39)
LDL CALC: 58 mg/dL (ref 0–99)
TRIGLYCERIDES: 109 mg/dL (ref 0–149)
VLDL CHOLESTEROL CAL: 22 mg/dL (ref 5–40)

## 2016-09-25 LAB — TESTOSTERONE, FREE, TOTAL, SHBG
Sex Hormone Binding: 91.8 nmol/L (ref 24.6–122.0)
TESTOSTERONE FREE: 1.9 pg/mL (ref 0.0–4.2)
Testosterone: 27 ng/dL (ref 8–48)

## 2016-09-25 LAB — THYROID PANEL WITH TSH
Free Thyroxine Index: 1.8 (ref 1.2–4.9)
T3 Uptake Ratio: 25 % (ref 24–39)
T4, Total: 7.2 ug/dL (ref 4.5–12.0)
TSH: 1.36 u[IU]/mL (ref 0.450–4.500)

## 2016-09-25 LAB — VITAMIN D 25 HYDROXY (VIT D DEFICIENCY, FRACTURES): VIT D 25 HYDROXY: 96.1 ng/mL (ref 30.0–100.0)

## 2016-09-25 LAB — HEMOGLOBIN A1C
Est. average glucose Bld gHb Est-mCnc: 100 mg/dL
Hgb A1c MFr Bld: 5.1 % (ref 4.8–5.6)

## 2016-09-25 LAB — B12 AND FOLATE PANEL
Folate: >20 ng/mL (ref >3.0)
Vitamin B-12: 1012 pg/mL (ref 232–1245)

## 2016-09-25 LAB — ESTRADIOL: Estradiol: 437.9 pg/mL

## 2016-09-25 LAB — DHEA-SULFATE: DHEA SO4: 182.4 ug/dL (ref 41.2–243.7)

## 2016-09-28 ENCOUNTER — Encounter: Payer: Self-pay | Admitting: *Deleted

## 2016-11-04 ENCOUNTER — Other Ambulatory Visit: Payer: Self-pay | Admitting: Orthopedic Surgery

## 2016-11-04 DIAGNOSIS — S43014A Anterior dislocation of right humerus, initial encounter: Secondary | ICD-10-CM

## 2016-11-11 ENCOUNTER — Ambulatory Visit
Admission: RE | Admit: 2016-11-11 | Discharge: 2016-11-11 | Disposition: A | Discharge status: Home / Self Care | Payer: BC Managed Care – PPO | Source: Ambulatory Visit | Attending: Orthopedic Surgery | Admitting: Orthopedic Surgery

## 2016-11-11 DIAGNOSIS — X58XXXA Exposure to other specified factors, initial encounter: Secondary | ICD-10-CM | POA: Diagnosis not present

## 2016-11-11 DIAGNOSIS — S43014A Anterior dislocation of right humerus, initial encounter: Secondary | ICD-10-CM

## 2016-11-11 MED ORDER — GADOBENATE DIMEGLUMINE 529 MG/ML IV SOLN
0.1000 mL | Freq: Once | INTRAVENOUS | Status: AC
  Administered 2016-11-11: 0.1 mL via INTRA_ARTICULAR

## 2016-11-11 MED ORDER — SODIUM CHLORIDE 0.9 % IJ SOLN
20.0000 mL | Freq: Once | INTRAMUSCULAR | Status: AC
  Administered 2016-11-11: 20 mL

## 2016-11-11 MED ORDER — IOPAMIDOL (ISOVUE-200) INJECTION 41%
15.0000 mL | Freq: Once | INTRAVENOUS | Status: AC | PRN
  Administered 2016-11-11: 15 mL
  Filled 2016-11-11: qty 50

## 2016-11-11 MED ORDER — LIDOCAINE HCL (PF) 1 % IJ SOLN
5.0000 mL | Freq: Once | INTRAMUSCULAR | Status: AC
  Administered 2016-11-11: 5 mL
  Filled 2016-11-11: qty 5

## 2017-01-13 ENCOUNTER — Telehealth: Payer: Self-pay

## 2017-01-13 NOTE — Telephone Encounter (Signed)
Copied from CRM [phone]#[id number]. Topic: General - Other >> Jan 13, 2017  4:27 PM Debroah LoopLander, Lumin L wrote: Reason for CRM: Cordelia PenSherry, calling from [facility] RE surgery scheduled 01/19/2017. Faxed surgical clearance form today and please contact patient as soon as possible if appt is needed.

## 2017-01-14 ENCOUNTER — Other Ambulatory Visit: Payer: BC Managed Care – PPO

## 2017-01-14 ENCOUNTER — Encounter
Admission: RE | Admit: 2017-01-14 | Discharge: 2017-01-14 | Disposition: A | Discharge status: Home / Self Care | Payer: BC Managed Care – PPO | Source: Ambulatory Visit | Attending: Orthopedic Surgery | Admitting: Orthopedic Surgery

## 2017-01-14 ENCOUNTER — Other Ambulatory Visit: Payer: Self-pay | Admitting: Orthopedic Surgery

## 2017-01-14 ENCOUNTER — Other Ambulatory Visit: Payer: Self-pay

## 2017-01-14 DIAGNOSIS — Z01812 Encounter for preprocedural laboratory examination: Secondary | ICD-10-CM | POA: Insufficient documentation

## 2017-01-14 HISTORY — DX: Hypothyroidism, unspecified: E03.9

## 2017-01-14 LAB — BASIC METABOLIC PANEL
ANION GAP: 7 (ref 5–15)
BUN: 25 mg/dL — AB (ref 6–20)
CALCIUM: 9.5 mg/dL (ref 8.9–10.3)
CO2: 25 mmol/L (ref 22–32)
CREATININE: 1.09 mg/dL — AB (ref 0.44–1.00)
Chloride: 103 mmol/L (ref 101–111)
GFR calc Af Amer: >60 mL/min (ref >60)
GFR, EST NON AFRICAN AMERICAN: 59 mL/min — AB (ref >60)
GLUCOSE: 100 mg/dL — AB (ref 65–99)
Potassium: 3.9 mmol/L (ref 3.5–5.1)
Sodium: 135 mmol/L (ref 135–145)

## 2017-01-14 LAB — CBC WITH DIFFERENTIAL/PLATELET
BASOS ABS: 0 10*3/uL (ref 0–0.1)
BASOS PCT: 1 %
EOS PCT: 1 %
Eosinophils Absolute: 0.1 10*3/uL (ref 0–0.7)
HEMATOCRIT: 42.3 % (ref 35.0–47.0)
Hemoglobin: 14.5 g/dL (ref 12.0–16.0)
Lymphocytes Relative: 18 %
Lymphs Abs: 1.7 10*3/uL (ref 1.0–3.6)
MCH: 31.4 pg (ref 26.0–34.0)
MCHC: 34.3 g/dL (ref 32.0–36.0)
MCV: 91.6 fL (ref 80.0–100.0)
MONO ABS: 0.6 10*3/uL (ref 0.2–0.9)
MONOS PCT: 6 %
NEUTROS ABS: 7.1 10*3/uL — AB (ref 1.4–6.5)
Neutrophils Relative %: 74 %
PLATELETS: 221 10*3/uL (ref 150–440)
RBC: 4.62 MIL/uL (ref 3.80–5.20)
RDW: 12.8 % (ref 11.5–14.5)
WBC: 9.5 10*3/uL (ref 3.6–11.0)

## 2017-01-14 LAB — APTT: aPTT: 28 seconds (ref 24–36)

## 2017-01-14 LAB — PROTIME-INR
INR: 0.83
Prothrombin Time: 11.3 seconds — ABNORMAL LOW (ref 11.4–15.2)

## 2017-01-14 NOTE — Telephone Encounter (Signed)
I faxed form back to ortho stating that TA has not seen this pt in a couple years/thx dmf

## 2017-01-14 NOTE — Telephone Encounter (Signed)
I will not even be in office prior to her surgery and have not seen her in a couple of years.  I cannot clear her without seeing her.  Can someone [facility] who has seen her more recently provide her clearance?

## 2017-01-14 NOTE — Telephone Encounter (Signed)
TA-I received a faxed form for surgical clearance for this patient/they require the following: Smoking Cessation 4 weeks prior to surgery either verified verbally or by lab If Low-risk; Mod-risk; High-risk for surgical procedure and if it is mod or high-risk need reason And if pt should Rivan Siordia/C any meds prior to surgery and how many days prior to surgery Surgery is scheduled for 12.19.18/plz advise/thx dmf

## 2017-01-14 NOTE — Patient Instructions (Signed)
[facility] 2nd floor medical mall [facility](Medical Mall Entrance-take elevator on left to 2nd floor.  Check in with surgery information desk.) To find out your arrival time please call [phone] between 1PM - 3PM on 01-18-17 TUESDAY  Remember: Instructions that are not followed completely may result in serious medical risk, up to and including death, or upon the discretion of your surgeon and anesthesiologist your surgery may need to be rescheduled.    _x___ 1. Do not eat food after midnight the night before your procedure. NO GUM OR CANDY AFTER MIDNIGHT.  You may drink clear liquids up to 2 hours before you are scheduled to arrive at the hospital for your procedure.  Do not drink clear liquids within 2 hours of your scheduled arrival to the hospital.  Clear liquids include  --Water or Apple juice without pulp  --Clear carbohydrate beverage such as ClearFast or Gatorade  --Black Coffee or Clear Tea (No milk, no creamers, do not add anything to the coffee or Tea     __x__ 2. No Alcohol for 24 hours before or after surgery.   __x__3. No Smoking for 24 prior to surgery.   ____  4. Bring all medications with you on the day of surgery if instructed.    __x__ 5. Notify your doctor if there is any change in your medical condition     (cold, fever, infections).     Do not wear jewelry, make-up, hairpins, clips or nail polish.  Do not wear lotions, powders, or perfumes. You may wear deodorant.  Do not shave 48 hours prior to surgery. Men may shave face and neck.  Do not bring valuables to the hospital.    [facility] is not responsible for any belongings or valuables.               Contacts, dentures or bridgework may not be worn into surgery.  Leave your suitcase in the car. After surgery it may be brought to your room.  For patients admitted to the hospital, discharge time is determined by your treatment team.   Patients  discharged the day of surgery will not be allowed to drive home.  You will need someone to drive you home and stay with you the night of your procedure.    Please read over the following fact sheets that you were given:   [facility] Preparing for Surgery and or MRSA Information   _x___ TAKE THE FOLLOWING MEDICATION THE MORNING OF SURGERY WITH A SMALL SIP OF WATER. These include:  1. ARMOUR THYROID  2.  3.  4.  5.  6.  ____Fleets enema or Magnesium Citrate as directed.   _x___ Use CHG Soap or sage wipes as directed on instruction sheet   ____ Use inhalers on the day of surgery and bring to hospital day of surgery  ____ Stop Metformin and Janumet 2 days prior to surgery.    ____ Take 1/2 of usual insulin dose the night before surgery and none on the morning surgery.   ____ Follow recommendations from Cardiologist, Pulmonologist or PCP regarding stopping Aspirin, Coumadin, Plavix ,Eliquis, Effient, or Pradaxa, and Pletal.  X____Stop Anti-inflammatories such as Advil, Aleve, Ibuprofen, Motrin, Naproxen, MOBIC, Naprosyn, Goodies powders or aspirin products NOW-OK to take Tylenol OR TRAMADOL IF NEEDED   _x___ Stop supplements until after surgery-STOP ASHWAGANDHA AND LEMON BALM SUPPLEMENTS NOW-MAY RESUME AFTER SURGERY   ____ Bring C-Pap to the hospital.

## 2017-01-17 ENCOUNTER — Telehealth: Payer: Self-pay | Admitting: Family Medicine

## 2017-01-17 NOTE — Telephone Encounter (Signed)
[name] called stating that she is a [name] pt and was [facility] Sept 2017[facility] her.  She is needing medical clearance.  Her MRN # [id number]. Her telephone number is [phone].

## 2017-01-17 NOTE — Telephone Encounter (Signed)
Brittany Sanchez,, pt was [facility]. It is my understanding [facility] PCP is [name]; per this note pts surgery scheduled 01/19/17. Can [facility] see pt?

## 2017-01-17 NOTE — Telephone Encounter (Signed)
RI-Dr. Dayton MartesAron is out of the office on vacation/pt [facility] to [facility]/She has seen Verl Blalockuncan, [facility], and [facility] in the past/Can you please try and help this patient be seen by someone there so she can get an EKG and be cleared for surgery?

## 2017-01-17 NOTE — Telephone Encounter (Signed)
RI-no one is willing because no one has seen her/I understand her last visit was a long time ago but she has seen other providers there so if there is a way to possibly discuss with them to see if someone is willing that would be great

## 2017-01-17 NOTE — Telephone Encounter (Signed)
Brittany Sanchez is aware that none of our providers [facility] can see patient before 12/19.

## 2017-01-17 NOTE — Telephone Encounter (Signed)
I have sent a message to Brittany Sanchez advising who she has seen in the past other than TA/thx dmf

## 2017-01-17 NOTE — Telephone Encounter (Signed)
[facility]. No provider has seen pt recently and advised Rosey Batheresa to contact [facility] for appt.

## 2017-01-17 NOTE — Telephone Encounter (Signed)
I can not give her a medical clearance. She has not been seen in over 1year. She needs to make an appt with pcp.

## 2017-01-17 NOTE — Telephone Encounter (Signed)
I have checked with the providers that have a possible appt and no one has a 30' appt before 01/19/17 to do the surgical/medical clearance.

## 2017-01-18 ENCOUNTER — Ambulatory Visit: Payer: BC Managed Care – PPO | Admitting: Family Medicine

## 2017-01-18 ENCOUNTER — Telehealth: Payer: Self-pay

## 2017-01-18 MED ORDER — CEFAZOLIN SODIUM-DEXTROSE 2-4 GM/100ML-% IV SOLN
2.0000 g | INTRAVENOUS | Status: AC
  Administered 2017-01-19: 2 g via INTRAVENOUS

## 2017-01-18 NOTE — Telephone Encounter (Signed)
**Note e-Identified via Obfuscation** r Brittany Sanchez is in agreement with seeing this patient today at 3:40pm for a surgical clearance at [name]/I LMOVM to pt stating to RTC and that I was also sending her a MyChart message/if she calls back please schedule her/thx dmf

## 2017-01-18 NOTE — Telephone Encounter (Signed)
Per Brittany Sanchez patient called and cancelled today's appt with Dr. Jordan LikesSchmitz as the Ortho said that they no longer need a surgical clearance/thx dmf

## 2017-01-19 ENCOUNTER — Ambulatory Visit
Admission: RE | Admit: 2017-01-19 | Discharge: 2017-01-19 | Disposition: A | Discharge status: Home / Self Care | Payer: BC Managed Care – PPO | Source: Ambulatory Visit | Attending: Orthopedic Surgery | Admitting: Orthopedic Surgery

## 2017-01-19 ENCOUNTER — Encounter
Admission: RE | Disposition: A | Discharge status: Home / Self Care | Payer: Self-pay | Source: Ambulatory Visit | Attending: Orthopedic Surgery

## 2017-01-19 ENCOUNTER — Ambulatory Visit: Payer: BC Managed Care – PPO | Admitting: Anesthesiology

## 2017-01-19 ENCOUNTER — Encounter: Payer: Self-pay | Admitting: Anesthesiology

## 2017-01-19 DIAGNOSIS — S43431A Superior glenoid labrum lesion of right shoulder, initial encounter: Secondary | ICD-10-CM | POA: Insufficient documentation

## 2017-01-19 DIAGNOSIS — Z79890 Hormone replacement therapy (postmenopausal): Secondary | ICD-10-CM | POA: Insufficient documentation

## 2017-01-19 DIAGNOSIS — Z7952 Long term (current) use of systemic steroids: Secondary | ICD-10-CM | POA: Diagnosis not present

## 2017-01-19 DIAGNOSIS — X58XXXA Exposure to other specified factors, initial encounter: Secondary | ICD-10-CM | POA: Diagnosis not present

## 2017-01-19 DIAGNOSIS — M24111 Other articular cartilage disorders, right shoulder: Secondary | ICD-10-CM | POA: Insufficient documentation

## 2017-01-19 DIAGNOSIS — Z791 Long term (current) use of non-steroidal anti-inflammatories (NSAID): Secondary | ICD-10-CM | POA: Diagnosis not present

## 2017-01-19 DIAGNOSIS — E039 Hypothyroidism, unspecified: Secondary | ICD-10-CM | POA: Insufficient documentation

## 2017-01-19 DIAGNOSIS — Z79899 Other long term (current) drug therapy: Secondary | ICD-10-CM | POA: Diagnosis not present

## 2017-01-19 DIAGNOSIS — Z79891 Long term (current) use of opiate analgesic: Secondary | ICD-10-CM | POA: Diagnosis not present

## 2017-01-19 DIAGNOSIS — M25311 Other instability, right shoulder: Secondary | ICD-10-CM | POA: Diagnosis present

## 2017-01-19 HISTORY — PX: SHOULDER ARTHROSCOPY WITH LABRAL REPAIR: SHX5691

## 2017-01-19 LAB — POCT PREGNANCY, URINE: PREG TEST UR: NEGATIVE

## 2017-01-19 SURGERY — ARTHROSCOPY, SHOULDER, WITH GLENOID LABRUM REPAIR
Anesthesia: Regional | Laterality: Right

## 2017-01-19 MED ORDER — LIDOCAINE HCL (CARDIAC) 20 MG/ML IV SOLN
INTRAVENOUS | Status: DC | PRN
  Administered 2017-01-19: 60 mg via INTRAVENOUS

## 2017-01-19 MED ORDER — LIDOCAINE HCL (PF) 1 % IJ SOLN
INTRAMUSCULAR | Status: AC
  Filled 2017-01-19: qty 30

## 2017-01-19 MED ORDER — FENTANYL CITRATE (PF) 100 MCG/2ML IJ SOLN
INTRAMUSCULAR | Status: AC
  Filled 2017-01-19: qty 2

## 2017-01-19 MED ORDER — ONDANSETRON HCL 4 MG/2ML IJ SOLN: 4.0000 mg | Freq: Once | INTRAMUSCULAR | Status: DC | PRN

## 2017-01-19 MED ORDER — ROCURONIUM BROMIDE 50 MG/5ML IV SOLN
INTRAVENOUS | Status: AC
  Filled 2017-01-19: qty 1

## 2017-01-19 MED ORDER — FAMOTIDINE 20 MG PO TABS
ORAL_TABLET | ORAL | Status: AC
  Filled 2017-01-19: qty 1

## 2017-01-19 MED ORDER — LACTATED RINGERS IV SOLN
INTRAVENOUS | Status: DC
  Administered 2017-01-19 (×2): via INTRAVENOUS

## 2017-01-19 MED ORDER — SUGAMMADEX SODIUM 200 MG/2ML IV SOLN
INTRAVENOUS | Status: DC | PRN
  Administered 2017-01-19: 120 mg via INTRAVENOUS

## 2017-01-19 MED ORDER — FENTANYL CITRATE (PF) 100 MCG/2ML IJ SOLN
50.0000 ug | Freq: Once | INTRAMUSCULAR | Status: AC
  Administered 2017-01-19: 50 ug via INTRAVENOUS

## 2017-01-19 MED ORDER — BUPIVACAINE LIPOSOME 1.3 % IJ SUSP
INTRAMUSCULAR | Status: AC
  Filled 2017-01-19: qty 20

## 2017-01-19 MED ORDER — BUPIVACAINE HCL (PF) 0.5 % IJ SOLN
INTRAMUSCULAR | Status: DC | PRN
  Administered 2017-01-19: 10 mL

## 2017-01-19 MED ORDER — ONDANSETRON HCL 4 MG/2ML IJ SOLN
INTRAMUSCULAR | Status: AC
  Filled 2017-01-19: qty 2

## 2017-01-19 MED ORDER — BUPIVACAINE LIPOSOME 1.3 % IJ SUSP
INTRAMUSCULAR | Status: DC | PRN
  Administered 2017-01-19: 20 mL

## 2017-01-19 MED ORDER — EPINEPHRINE 30 MG/30ML IJ SOLN
INTRAMUSCULAR | Status: AC
  Filled 2017-01-19: qty 1

## 2017-01-19 MED ORDER — ONDANSETRON HCL 4 MG/2ML IJ SOLN
INTRAMUSCULAR | Status: DC | PRN
  Administered 2017-01-19: 4 mg via INTRAVENOUS

## 2017-01-19 MED ORDER — MIDAZOLAM HCL 2 MG/2ML IJ SOLN
1.0000 mg | Freq: Once | INTRAMUSCULAR | Status: AC
  Administered 2017-01-19: 1 mg via INTRAVENOUS

## 2017-01-19 MED ORDER — SUGAMMADEX SODIUM 200 MG/2ML IV SOLN
INTRAVENOUS | Status: AC
  Filled 2017-01-19: qty 2

## 2017-01-19 MED ORDER — BUPIVACAINE HCL (PF) 0.25 % IJ SOLN
INTRAMUSCULAR | Status: DC | PRN
  Administered 2017-01-19: 30 mL

## 2017-01-19 MED ORDER — LACTATED RINGERS IV SOLN
INTRAVENOUS | Status: DC | PRN
  Administered 2017-01-19: 22 mL

## 2017-01-19 MED ORDER — ONDANSETRON HCL 4 MG PO TABS: 4.0000 mg | ORAL_TABLET | Freq: Three times a day (TID) | ORAL | 0 refills | Status: DC | PRN

## 2017-01-19 MED ORDER — CHLORHEXIDINE GLUCONATE CLOTH 2 % EX PADS: 6.0000 | MEDICATED_PAD | Freq: Once | CUTANEOUS | Status: DC

## 2017-01-19 MED ORDER — FENTANYL CITRATE (PF) 100 MCG/2ML IJ SOLN
INTRAMUSCULAR | Status: DC | PRN
  Administered 2017-01-19: 50 ug via INTRAVENOUS
  Administered 2017-01-19 (×2): 25 ug via INTRAVENOUS

## 2017-01-19 MED ORDER — DEXAMETHASONE SODIUM PHOSPHATE 10 MG/ML IJ SOLN
INTRAMUSCULAR | Status: DC | PRN
  Administered 2017-01-19: 10 mg via INTRAVENOUS

## 2017-01-19 MED ORDER — FENTANYL CITRATE (PF) 100 MCG/2ML IJ SOLN
INTRAMUSCULAR | Status: AC
  Administered 2017-01-19: 50 ug via INTRAVENOUS
  Filled 2017-01-19: qty 2

## 2017-01-19 MED ORDER — FENTANYL CITRATE (PF) 100 MCG/2ML IJ SOLN: 25.0000 ug | INTRAMUSCULAR | Status: DC | PRN

## 2017-01-19 MED ORDER — MIDAZOLAM HCL 2 MG/2ML IJ SOLN
INTRAMUSCULAR | Status: DC | PRN
  Administered 2017-01-19: 2 mg via INTRAVENOUS

## 2017-01-19 MED ORDER — ROCURONIUM BROMIDE 100 MG/10ML IV SOLN
INTRAVENOUS | Status: DC | PRN
  Administered 2017-01-19: 10 mg via INTRAVENOUS
  Administered 2017-01-19: 30 mg via INTRAVENOUS
  Administered 2017-01-19: 10 mg via INTRAVENOUS

## 2017-01-19 MED ORDER — KETOROLAC TROMETHAMINE 30 MG/ML IJ SOLN
INTRAMUSCULAR | Status: AC
  Filled 2017-01-19: qty 1

## 2017-01-19 MED ORDER — BUPIVACAINE HCL (PF) 0.5 % IJ SOLN
INTRAMUSCULAR | Status: AC
  Filled 2017-01-19: qty 20

## 2017-01-19 MED ORDER — ACETAMINOPHEN 10 MG/ML IV SOLN
INTRAVENOUS | Status: DC | PRN
  Administered 2017-01-19: 1000 mg via INTRAVENOUS

## 2017-01-19 MED ORDER — MIDAZOLAM HCL 2 MG/2ML IJ SOLN
INTRAMUSCULAR | Status: AC
  Filled 2017-01-19: qty 2

## 2017-01-19 MED ORDER — PROPOFOL 10 MG/ML IV BOLUS
INTRAVENOUS | Status: DC | PRN
  Administered 2017-01-19: 120 mg via INTRAVENOUS

## 2017-01-19 MED ORDER — PROPOFOL 10 MG/ML IV BOLUS
INTRAVENOUS | Status: AC
  Filled 2017-01-19: qty 20

## 2017-01-19 MED ORDER — LIDOCAINE HCL (PF) 1 % IJ SOLN
INTRAMUSCULAR | Status: AC
  Filled 2017-01-19: qty 5

## 2017-01-19 MED ORDER — BUPIVACAINE HCL (PF) 0.25 % IJ SOLN
INTRAMUSCULAR | Status: AC
  Filled 2017-01-19: qty 30

## 2017-01-19 MED ORDER — FAMOTIDINE 20 MG PO TABS
20.0000 mg | ORAL_TABLET | Freq: Once | ORAL | Status: AC
  Administered 2017-01-19: 20 mg via ORAL

## 2017-01-19 MED ORDER — OXYCODONE HCL 5 MG PO TABS: 5.0000 mg | ORAL_TABLET | ORAL | 0 refills | Status: DC | PRN

## 2017-01-19 MED ORDER — LIDOCAINE HCL (PF) 2 % IJ SOLN
INTRAMUSCULAR | Status: AC
  Filled 2017-01-19: qty 10

## 2017-01-19 MED ORDER — DEXAMETHASONE SODIUM PHOSPHATE 10 MG/ML IJ SOLN
INTRAMUSCULAR | Status: AC
  Filled 2017-01-19: qty 1

## 2017-01-19 MED ORDER — MIDAZOLAM HCL 2 MG/2ML IJ SOLN
INTRAMUSCULAR | Status: AC
  Administered 2017-01-19: 1 mg via INTRAVENOUS
  Filled 2017-01-19: qty 2

## 2017-01-19 MED ORDER — EPHEDRINE SULFATE 50 MG/ML IJ SOLN
INTRAMUSCULAR | Status: DC | PRN
Start: 2017-01-19 — End: 2017-01-19
  Administered 2017-01-19 (×2): 5 mg via INTRAVENOUS
  Administered 2017-01-19: 15 mg via INTRAVENOUS
  Administered 2017-01-19 (×4): 5 mg via INTRAVENOUS

## 2017-01-19 MED ORDER — CEFAZOLIN SODIUM-DEXTROSE 2-4 GM/100ML-% IV SOLN
INTRAVENOUS | Status: AC
  Filled 2017-01-19: qty 100

## 2017-01-19 SURGICAL SUPPLY — 63 items
ADAPTER IRRIG TUBE 2 SPIKE SOL (ADAPTER) ×6 IMPLANT
ANCHOR SUT  BIOC ST 3X14.5 (Orthopedic Implant) ×8 IMPLANT
ANCHOR SUT BIOC ST 3X14.5 (Orthopedic Implant) ×4 IMPLANT
ANCHOR SUT BIOC ST 3X145 (Anchor) ×15 IMPLANT
BUR RADIUS 4.0X18.5 (BURR) ×6 IMPLANT
BUR RADIUS 5.5 (BURR) ×6 IMPLANT
CANISTER SUCT LVC 12 LTR MEDI- (MISCELLANEOUS) IMPLANT
CANNULA 5.75X7 CRYSTAL CLEAR (CANNULA) ×6 IMPLANT
CANNULA PARTIAL THREAD 2X7 (CANNULA) ×6 IMPLANT
CANNULA TWIST IN 8.25X9CM (CANNULA) IMPLANT
CLOSURE WOUND 1/2 X4 (GAUZE/BANDAGES/DRESSINGS) ×1
COOLER POLAR GLACIER W/PUMP (MISCELLANEOUS) ×3 IMPLANT
CRADLE LAMINECT ARM (MISCELLANEOUS) ×3 IMPLANT
DEVICE SUCT BLK HOLE OR FLOOR (MISCELLANEOUS) IMPLANT
DRAPE IMP U-DRAPE 54X76 (DRAPES) ×6 IMPLANT
DRAPE INCISE IOBAN 66X45 STRL (DRAPES) ×3 IMPLANT
DRAPE SHEET LG 3/4 BI-LAMINATE (DRAPES) ×3 IMPLANT
DRAPE U-SHAPE 47X51 STRL (DRAPES) IMPLANT
DURAPREP 26ML APPLICATOR (WOUND CARE) ×9 IMPLANT
ELECT REM PT RETURN 9FT ADLT (ELECTROSURGICAL)
ELECTRODE REM PT RTRN 9FT ADLT (ELECTROSURGICAL) IMPLANT
GAUZE PETRO XEROFOAM 1X8 (MISCELLANEOUS) ×3 IMPLANT
GAUZE SPONGE 4X4 12PLY STRL (GAUZE/BANDAGES/DRESSINGS) ×3 IMPLANT
GLOVE BIOGEL PI IND STRL 9 (GLOVE) ×1 IMPLANT
GLOVE BIOGEL PI INDICATOR 9 (GLOVE) ×2
GLOVE SURG 9.0 ORTHO LTXF (GLOVE) ×6 IMPLANT
GOWN STRL REUS TWL 2XL XL LVL4 (GOWN DISPOSABLE) ×3 IMPLANT
GOWN STRL REUS W/ TWL LRG LVL3 (GOWN DISPOSABLE) ×1 IMPLANT
GOWN STRL REUS W/ TWL LRG LVL4 (GOWN DISPOSABLE) ×1 IMPLANT
GOWN STRL REUS W/TWL LRG LVL3 (GOWN DISPOSABLE) ×2
GOWN STRL REUS W/TWL LRG LVL4 (GOWN DISPOSABLE) ×2
IV LACTATED RINGER IRRG 3000ML (IV SOLUTION) ×12
IV LR IRRIG 3000ML ARTHROMATIC (IV SOLUTION) ×6 IMPLANT
KIT RM TURNOVER STRD PROC AR (KITS) ×3 IMPLANT
KIT STABILIZATION SHOULDER (MISCELLANEOUS) ×3 IMPLANT
KIT SUTURETAK 3.0 INSERT PERC (KITS) ×6 IMPLANT
MANIFOLD NEPTUNE II (INSTRUMENTS) ×3 IMPLANT
MASK FACE SPIDER DISP (MASK) ×3 IMPLANT
MAT BLUE FLOOR 46X72 FLO (MISCELLANEOUS) ×6 IMPLANT
NEEDLE HYPO 22GX1.5 SAFETY (NEEDLE) ×3 IMPLANT
PACK ARTHROSCOPY SHOULDER (MISCELLANEOUS) ×3 IMPLANT
PAD WRAPON POLAR SHDR XLG (MISCELLANEOUS) ×1 IMPLANT
SET TUBE SUCT SHAVER OUTFL 24K (TUBING) ×3 IMPLANT
SET TUBE TIP INTRA-ARTICULAR (MISCELLANEOUS) ×3 IMPLANT
STRAP SAFETY BODY (MISCELLANEOUS) ×3 IMPLANT
STRIP CLOSURE SKIN 1/2X4 (GAUZE/BANDAGES/DRESSINGS) ×2 IMPLANT
SUT ETHILON 4-0 (SUTURE) ×2
SUT ETHILON 4-0 FS2 18XMFL BLK (SUTURE) ×1
SUT LASSO 90 DEG SD STR (SUTURE) ×6 IMPLANT
SUT MNCRL 4-0 (SUTURE) ×2
SUT MNCRL 4-0 27XMFL (SUTURE) ×1
SUT PDS AB 0 CT1 27 (SUTURE) IMPLANT
SUT VIC AB 0 CT1 36 (SUTURE) IMPLANT
SUT VIC AB 2-0 CT2 27 (SUTURE) IMPLANT
SUTURE ETHLN 4-0 FS2 18XMF BLK (SUTURE) ×1 IMPLANT
SUTURE MAGNUM WIRE 2X48 BLK (SUTURE) IMPLANT
SUTURE MNCRL 4-0 27XMF (SUTURE) ×1 IMPLANT
TAPE MICROFOAM 4IN (TAPE) ×3 IMPLANT
TUBING ARTHRO INFLOW-ONLY STRL (TUBING) ×3 IMPLANT
TUBING CONNECTING 10 (TUBING) ×2 IMPLANT
TUBING CONNECTING 10' (TUBING) ×1
WAND HAND CNTRL MULTIVAC 90 (MISCELLANEOUS) ×3 IMPLANT
WRAPON POLAR PAD SHDR XLG (MISCELLANEOUS) ×3

## 2017-01-19 NOTE — Anesthesia Preprocedure Evaluation (Signed)
Anesthesia Evaluation  Patient identified by MRN, date of birth, ID band Patient awake    Reviewed: Allergy & Precautions, NPO status , Patient's Chart, lab work & pertinent test results, reviewed documented beta blocker date and time   Airway Mallampati: II  TM Distance: >3 FB     Dental  (+) Chipped   Pulmonary           Cardiovascular      Neuro/Psych Anxiety    GI/Hepatic   Endo/Other  Hypothyroidism   Renal/GU      Musculoskeletal   Abdominal   Peds  Hematology   Anesthesia Other Findings   Reproductive/Obstetrics                             Anesthesia Physical Anesthesia Plan  ASA: II  Anesthesia Plan: General and Regional   Post-op Pain Management:    Induction: Intravenous  PONV Risk Score and Plan:   Airway Management Planned: Oral ETT  Additional Equipment:   Intra-op Plan:   Post-operative Plan:   Informed Consent: I have reviewed the patients History and Physical, chart, labs and discussed the procedure including the risks, benefits and alternatives for the proposed anesthesia with the patient or authorized representative who has indicated his/her understanding and acceptance.     Plan Discussed with: CRNA  Anesthesia Plan Comments:         Anesthesia Quick Evaluation

## 2017-01-19 NOTE — Anesthesia Post-op Follow-up Note (Signed)
Anesthesia QCDR form completed.        

## 2017-01-19 NOTE — Op Note (Signed)
01/19/2017  5:19 PM  PATIENT:  Brittany Sanchez  46 y.o. female  PRE-OPERATIVE DIAGNOSIS:  Instability of right shoulder  POST-OPERATIVE DIAGNOSIS:  SLAP and anterior labral tear of right shoulder  PROCEDURE:  Procedure(s): RIGHT SHOULDER ARTHROSCOPY WITH ANTERIOR LABRAL REPAIR, SLAP REPAIR   SURGEON:  Surgeon(s) and Role:    Thornton Park, MD - Primary  ANESTHESIA:   local, general and paracervical block   PREOPERATIVE INDICATIONS:  Brittany Sanchez is a  46 y.o. female with a diagnosis of recurrent instability of right shoulder who has failed nonoperative treatment and elected for surgical management.    I discussed the risks and benefits of surgery. The risks include but are not limited to infection, bleeding, nerve or blood vessel injury, joint stiffness or loss of motion, persistent pain, weakness or instability, and hardware failure and the need for further surgery. Medical risks include but are not limited to DVT and pulmonary embolism, myocardial infarction, stroke, pneumonia, respiratory failure and death. Patient understood these risks and wished to proceed.   OPERATIVE IMPLANTS: Arthrex bio suturetak anchors  5   OPERATIVE FINDINGS:  Right shoulder large displaced anterior labral tear with multidirectional instability  OPERATIVE PROCEDURE:  I met with the patient in the preoperative area.  I signed the right shoulder according the hospital's correct site of surgery protocol.  I answered all questions by the patient. The patient underwent an interscalene blockby the anesthesia service with Exparel in the pre-op area.  Patient was then brought to the operating room. She underwent general endotracheal intubation. She was then positioned in a beachchair position. All bony prominences were adequately padded including the lower extremities.  Examination under anesthesia revealed increased anterior translation with load and shift testing but a negative sulcus sign.  She had full  passive ROM.   The patient was then prepped and draped in a sterile fashion. The patient received 2 grams of Ancef prior to the onset of the case.  A timeout was performed to verify the patient's name, date of birth, medical record number, correct site of surgery and correct procedure to be performed.The timeout was also used to verify the patient received antibiotics that all appropriate instruments, implants and radiographic studies were available in the room. Once all in attendance were in agreement case began.  Bony landmarks were drawn out with a surgical marker along with proposed incisions. These were pre-injected with 1% lidocaine plain. An 11 blade was used to establish a posterior portal through which the arthroscope was placed in the glenohumeral joint. A low anterior portal was established under direct visualization using an 18-gauge spinal needle for localization. A 5.75 mm arthroscopic cannula was inserted through the anterior portal. A full diagnostic examination of the glenohumeral joint was performed.  The entire anterior labrum was torn off the osseous glenoid from the 12 o'clock to 6 o'clock position.  The superior labrum was also torn destabilizing the biceps anchor.  There was no evidence of a rotator cuff tear and the intra-articular portion of the biceps tendon was not torn.  Here were no loose bodies or HAGL lesion seen during evaluation of the inferior recess.   There was no posterior labral tear.    An anterolateral portal was established again under direct visualization using an 18-gauge spinal needle. A 7 mm cannula was placed through this anterolateral portal.  A second 29m cannula replaced the 5.75 mm anterior portal.  This was exchanged over a switching stick.  The labrum was mobilized with an  arthroscopic elevator.   The anterior, non-articular aspect of the glenoid was debrided using a 4.0 resector shaver on burr mode.    An Arthrex NIKE anchor was then placed at the  5 o'clock position.  A 90 degree Arthrex suture lasso was then used to shuttle a single limb of this anchor under the anterior inferior labrum.  Arthroscopic knot tying technique was then used to approximate the labrum to the glenoid bone.  Two additional anchors were placed at the 4 o'clock and 3 o'clock positions to repair the torn anterior inferior labrum.  The inferior glenohumeral ligament was retensioned with this repair.   The superior most aspect of the anterior labrum was fixed to the glenoid with an Arthrex bio suturetak anchor placed at approximately the 1:00 positionusing the same technique as described above.  This completed the anterior labral repair.    The attention was then turned to performing a SLAP repair.  A 4.0 resector shaver blade was used to debride and burr the nonarticular portion of the superior glenoid.  A fifth Arthrex bio suture Tak anchor was then placed at the 12:00 position. The 90 suture lasso was then used to shuttle a single limb of this anchor under the superior labrum at the biceps anchor. Again an arthroscopic knot tying technique was used to complete the repair.  Final arthroscopic images were then taken. The SLAP and anterior labral repairs were probed for stability.  Probing confirmed excellent approximation and tension of the superior and anterior labrum with the glenoid.  Patient did not demonstrate any laxity or increased anterior translation of the humeral head after the labral repair.  The glenohumeral joint was then copiously irrigated.  The arthroscope was then placed into the subacromial space. A limited bursectomy was performed with a 90 ArthroCare wand through the anterolateral portal.  Is allowed for visualization of the rotator cuff from the bursal side.  This evaluation confirmed that there was no evidence of rotator cuff tear.  No subacromial decompression was performed.  All arthroscopic instruments were removed. The three arthroscopic portals were  closed with 4-0 nylon. A dry, sterile dressing was applied to the right shoulder, along with a Polar Care sleeve. Patient's right arm was then placed in an abduction sling. She was awoken and brought to PACU in stable condition. I was scrubbed and present for the entire case and all sharp and instrument counts were correct at the conclusion the case. I spoke with the patient's friend by phone and left her husband a voicemail to let them know the operation was successful and performed without complication and that the patient was stable in the recovery room.    The patient was examined after surgery in the PACU. Her interscalene block was working well. She had minimal sensation to her right hand and was not able to move the fingers of her right hand.  Patient was not having any significant pain.  She'll be discharged home tonight and follow up with me in the office in 1 week. She will remain in her abduction sling at all times until her follow-up.    Timoteo Gaul, MD

## 2017-01-19 NOTE — H&P (Signed)
The patient has been re-examined, and the chart reviewed, and there have been no interval changes to the documented history and physical.    The risks, benefits, and alternatives have been discussed at length, and the patient is willing to proceed.   

## 2017-01-19 NOTE — Discharge Instructions (Signed)

## 2017-01-19 NOTE — Anesthesia Procedure Notes (Signed)
Anesthesia Regional Block: Interscalene brachial plexus block   Pre-Anesthetic Checklist: ,, timeout performed, Correct Patient, Correct Site, Correct Laterality, Correct Procedure, Correct Position, site marked, Risks and benefits discussed,  Surgical consent,  Pre-op evaluation,  At surgeon's request and post-op pain management   Prep: chloraprep       Needles:  Injection technique: Single-shot  Needle Type: Echogenic Stimulator Needle     Needle Length: 5cm  Needle Gauge: 21     Additional Needles:   Procedures:, nerve stimulator,,,,,,,   Nerve Stimulator or Paresthesia:  Response: biceps flexion, 0.8 mA,   Additional Responses:   Narrative:  Injection made incrementally with aspirations every 5 mL.  Performed by: Personally  Anesthesiologist: Sadler Teschner, MD  Additional Notes: Functioning IV was confirmed and monitors were applied.  A 50mm 22ga Arrow echogenic stimulator needle was used. Sterile prep and drape,hand hygiene and sterile gloves were used.  Negative aspiration and negative test dose prior to incremental administration of local anesthetic. The patient tolerated the procedure well.  Ultrasound guidance: relevent anatomy identified, needle position confirmed, local anesthetic spread visualized around nerve(s), vascular puncture avoided.  Image printed for medical record.       

## 2017-01-19 NOTE — Anesthesia Procedure Notes (Signed)
Procedure Name: Intubation Date/Time: 01/19/2017 1:33 PM Performed by: Dava NajjarFrazier, Tyah Acord, CRNA Pre-anesthesia Checklist: Patient identified, Emergency Drugs available, Suction available, Patient being monitored and Timeout performed Patient Re-evaluated:Patient Re-evaluated prior to induction Oxygen Delivery Method: Circle system utilized Preoxygenation: Pre-oxygenation with 100% oxygen Induction Type: IV induction Ventilation: Mask ventilation without difficulty Laryngoscope Size: Miller and 2 Grade View: Grade I Tube type: Oral Tube size: 7.0 mm Number of attempts: 1 Airway Equipment and Method: Stylet Placement Confirmation: ETT inserted through vocal cords under direct vision,  positive ETCO2 and breath sounds checked- equal and bilateral Secured at: 21 cm Tube secured with: Tape Dental Injury: Teeth and Oropharynx as per pre-operative assessment

## 2017-01-19 NOTE — Transfer of Care (Signed)
Immediate Anesthesia Transfer of Care Note  Patient: Comer LocketMarcy Chimento  Procedure(s) Performed: SHOULDER ARTHROSCOPY WITH LABRAL REPAIR (Right )  Patient Location: PACU  Anesthesia Type:General  Level of Consciousness: drowsy and patient cooperative  Airway & Oxygen Therapy: Patient Spontanous Breathing and Patient connected to face mask oxygen  Post-op Assessment: Report given to RN and Post -op Vital signs reviewed and stable  Post vital signs: Reviewed and stable  Last Vitals:  Vitals:   01/19/17 1318 01/19/17 1615  BP:  (!) 95/57  Pulse: (!) 58 66  Resp: 14 11  Temp:  36.5 C  SpO2: 100% 100%    Last Pain:  Vitals:   01/19/17 1159  TempSrc: Tympanic         Complications: No apparent anesthesia complications

## 2017-01-20 ENCOUNTER — Encounter: Payer: Self-pay | Admitting: Orthopedic Surgery

## 2017-01-20 NOTE — Anesthesia Postprocedure Evaluation (Signed)
Anesthesia Post Note  Patient: Brittany Sanchez  Procedure(s) Performed: SHOULDER ARTHROSCOPY WITH LABRAL REPAIR, SLAP REPAIR (Right )  Patient location during evaluation: PACU Anesthesia Type: Regional Level of consciousness: awake and alert Pain management: pain level controlled Vital Signs Assessment: post-procedure vital signs reviewed and stable Respiratory status: spontaneous breathing, nonlabored ventilation, respiratory function stable and patient connected to nasal cannula oxygen Cardiovascular status: blood pressure returned to baseline and stable Postop Assessment: no apparent nausea or vomiting Anesthetic complications: no     Last Vitals:  Vitals:   01/19/17 1713 01/19/17 1747  BP: 114/60 (!) 104/54  Pulse:  86  Resp:  16  Temp:    SpO2:  100%    Last Pain:  Vitals:   01/19/17 1747  TempSrc:   PainSc: 0-No pain                 Makale Pindell S

## 2017-02-17 ENCOUNTER — Other Ambulatory Visit: Payer: Self-pay | Admitting: Obstetrics and Gynecology

## 2017-02-17 ENCOUNTER — Ambulatory Visit
Admission: RE | Admit: 2017-02-17 | Discharge: 2017-02-17 | Disposition: A | Discharge status: Home / Self Care | Payer: BC Managed Care – PPO | Source: Ambulatory Visit | Attending: Obstetrics and Gynecology | Admitting: Obstetrics and Gynecology

## 2017-02-17 DIAGNOSIS — Z1231 Encounter for screening mammogram for malignant neoplasm of breast: Secondary | ICD-10-CM | POA: Diagnosis present

## 2017-02-17 DIAGNOSIS — R928 Other abnormal and inconclusive findings on diagnostic imaging of breast: Secondary | ICD-10-CM | POA: Diagnosis not present

## 2017-02-17 DIAGNOSIS — Z01419 Encounter for gynecological examination (general) (routine) without abnormal findings: Secondary | ICD-10-CM

## 2017-02-18 ENCOUNTER — Inpatient Hospital Stay
Admission: RE | Admit: 2017-02-18 | Discharge: 2017-02-18 | Disposition: A | Discharge status: Home / Self Care | Payer: Self-pay | Source: Ambulatory Visit | Attending: *Deleted | Admitting: *Deleted

## 2017-02-18 ENCOUNTER — Other Ambulatory Visit: Payer: Self-pay | Admitting: *Deleted

## 2017-02-18 ENCOUNTER — Other Ambulatory Visit: Payer: Self-pay | Admitting: Obstetrics and Gynecology

## 2017-02-18 DIAGNOSIS — Z9289 Personal history of other medical treatment: Secondary | ICD-10-CM

## 2017-02-18 DIAGNOSIS — R928 Other abnormal and inconclusive findings on diagnostic imaging of breast: Secondary | ICD-10-CM

## 2017-02-18 DIAGNOSIS — R921 Mammographic calcification found on diagnostic imaging of breast: Secondary | ICD-10-CM

## 2017-02-22 ENCOUNTER — Encounter: Payer: Self-pay | Admitting: Obstetrics and Gynecology

## 2017-02-28 ENCOUNTER — Other Ambulatory Visit: Payer: Self-pay | Admitting: Certified Nurse Midwife

## 2017-02-28 DIAGNOSIS — R928 Other abnormal and inconclusive findings on diagnostic imaging of breast: Secondary | ICD-10-CM

## 2017-02-28 DIAGNOSIS — R921 Mammographic calcification found on diagnostic imaging of breast: Secondary | ICD-10-CM

## 2017-03-30 ENCOUNTER — Other Ambulatory Visit: Payer: Self-pay | Admitting: Certified Nurse Midwife

## 2017-03-30 ENCOUNTER — Ambulatory Visit
Admission: RE | Admit: 2017-03-30 | Discharge: 2017-03-30 | Disposition: A | Discharge status: Home / Self Care | Payer: BC Managed Care – PPO | Source: Ambulatory Visit | Attending: Certified Nurse Midwife | Admitting: Certified Nurse Midwife

## 2017-03-30 DIAGNOSIS — R921 Mammographic calcification found on diagnostic imaging of breast: Secondary | ICD-10-CM

## 2017-03-30 DIAGNOSIS — R928 Other abnormal and inconclusive findings on diagnostic imaging of breast: Secondary | ICD-10-CM

## 2017-04-06 ENCOUNTER — Ambulatory Visit
Admission: RE | Admit: 2017-04-06 | Discharge: 2017-04-06 | Disposition: A | Discharge status: Home / Self Care | Payer: BC Managed Care – PPO | Source: Ambulatory Visit | Attending: Certified Nurse Midwife | Admitting: Certified Nurse Midwife

## 2017-04-06 DIAGNOSIS — R928 Other abnormal and inconclusive findings on diagnostic imaging of breast: Secondary | ICD-10-CM

## 2017-04-06 DIAGNOSIS — R921 Mammographic calcification found on diagnostic imaging of breast: Secondary | ICD-10-CM

## 2017-04-06 HISTORY — PX: BREAST BIOPSY: SHX20

## 2017-04-08 LAB — SURGICAL PATHOLOGY

## 2017-04-27 ENCOUNTER — Encounter: Payer: Self-pay | Admitting: Nurse Practitioner

## 2017-04-27 ENCOUNTER — Ambulatory Visit: Payer: BC Managed Care – PPO | Admitting: Nurse Practitioner

## 2017-04-27 VITALS — BP 102/80 | HR 65 | Temp 98.1°F | Ht 62.0 in | Wt 144.0 lb

## 2017-04-27 DIAGNOSIS — J209 Acute bronchitis, unspecified: Secondary | ICD-10-CM

## 2017-04-27 MED ORDER — ALBUTEROL SULFATE HFA 108 (90 BASE) MCG/ACT IN AERS
1.0000 | INHALATION_SPRAY | Freq: Four times a day (QID) | RESPIRATORY_TRACT | 0 refills | Status: DC | PRN
Start: 2017-04-27 — End: 2017-05-24

## 2017-04-27 MED ORDER — METHYLPREDNISOLONE 4 MG PO TBPK: ORAL_TABLET | ORAL | 0 refills | Status: DC

## 2017-04-27 NOTE — Patient Instructions (Addendum)
Call office for CXR if no improvement in 1week or if develops fever>101.  Continue mucinex DM or your prescription cough syrup as prescribed.  Acute Bronchitis, Adult Acute bronchitis is when air tubes (bronchi) in the lungs suddenly get swollen. The condition can make it hard to breathe. It can also cause these symptoms:  A cough.  Coughing up clear, yellow, or green mucus.  Wheezing.  Chest congestion.  Shortness of breath.  A fever.  Body aches.  Chills.  A sore throat.  Follow these instructions at home: Medicines  Take over-the-counter and prescription medicines only as told by your doctor.  If you were prescribed an antibiotic medicine, take it as told by your doctor. Do not stop taking the antibiotic even if you start to feel better. General instructions  Rest.  Drink enough fluids to keep your pee (urine) clear or pale yellow.  Avoid smoking and secondhand smoke. If you smoke and you need help quitting, ask your doctor. Quitting will help your lungs heal faster.  Use an inhaler, cool mist vaporizer, or humidifier as told by your doctor.  Keep all follow-up visits as told by your doctor. This is important. How is this prevented? To lower your risk of getting this condition again:  Wash your hands often with soap and water. If you cannot use soap and water, use hand sanitizer.  Avoid contact with people who have cold symptoms.  Try not to touch your hands to your mouth, nose, or eyes.  Make sure to get the flu shot every year.  Contact a doctor if:  Your symptoms do not get better in 2 weeks. Get help right away if:  You cough up blood.  You have chest pain.  You have very bad shortness of breath.  You become dehydrated.  You faint (pass out) or keep feeling like you are going to pass out.  You keep throwing up (vomiting).  You have a very bad headache.  Your fever or chills gets worse. This information is not intended to replace advice  given to you by your health care provider. Make sure you discuss any questions you have with your health care provider. Document Released: 07/07/2007 Document Revised: 08/27/2015 Document Reviewed: 07/09/2015 Elsevier Interactive Patient Education  [employer]2018 [employer]

## 2017-04-27 NOTE — Progress Notes (Signed)
Subjective:  Patient ID: Brittany Sanchez, female    DOB: 1970-02-27  Age: 47 y.o. MRN: [id number]  CC: Cough ( Dry cough started saturday, Sunday sore throat and extreme fatigue, headache, very dizzy. OTC for relief.)  Cough  This is a new problem. The current episode started in the past 7 days. The problem has been unchanged. The problem occurs constantly. The cough is non-productive. Associated symptoms include nasal congestion, a sore throat and shortness of breath. Pertinent negatives include no chest pain, chills, ear congestion, ear pain, fever, headaches, heartburn, postnasal drip, rhinorrhea, sweats, weight loss or wheezing. The symptoms are aggravated by lying down. She has tried OTC cough suppressant and prescription cough suppressant for the symptoms. The treatment provided significant relief.    Outpatient Medications Prior to Visit  Medication Sig Dispense Refill  . ARMOUR THYROID PO Take 75 mg by mouth every morning.    . ASHWAGANDHA PO Take 3 capsules by mouth at bedtime.    . methocarbamol (ROBAXIN) 500 MG tablet Take 500 mg by mouth 2 (two) times daily as needed for muscle spasms.    . Multiple Vitamins-Minerals (MULTIVITAMIN PO) Take 2 tablets by mouth daily.    Marland Kitchen OVER THE COUNTER MEDICATION Take 3 capsules by mouth at bedtime. Lemon Balm Supplement    . progesterone (PROMETRIUM) 100 MG capsule Take 200 mg by mouth daily.     . meloxicam (MOBIC) 15 MG tablet Take 15 mg by mouth daily.    . ondansetron (ZOFRAN) 4 MG tablet Take 1 tablet (4 mg total) by mouth every 8 (eight) hours as needed for nausea or vomiting. (Patient not taking: Reported on 04/27/2017) 30 tablet 0  . oxyCODONE (OXY IR/ROXICODONE) 5 MG immediate release tablet Take 1 tablet (5 mg total) by mouth every 4 (four) hours as needed for severe pain. (Patient not taking: Reported on 04/27/2017) 40 tablet 0   No facility-administered medications prior to visit.     ROS See HPI  Objective:  BP 102/80 (BP  Location: Left Arm, Patient Position: Sitting, Cuff Size: Normal)   Pulse 65   Temp 98.1 F (36.7 C) (Oral)   Ht 5\' 2"  (1.575 m)   Wt 144 lb (65.3 kg)   SpO2 95%   BMI 26.34 kg/m   BP Readings from Last 3 Encounters:  04/27/17 102/80  01/19/17 (!) 104/54  09/22/16 108/69    Wt Readings from Last 3 Encounters:  04/27/17 144 lb (65.3 kg)  01/19/17 140 lb (63.5 kg)  09/22/16 141 lb 8 oz (64.2 kg)    Physical Exam  Constitutional: She is oriented to person, place, and time. No distress.  HENT:  Right Ear: Tympanic membrane, external ear and ear canal normal.  Left Ear: Tympanic membrane, external ear and ear canal normal.  Nose: Mucosal edema and rhinorrhea present. Right sinus exhibits maxillary sinus tenderness. Right sinus exhibits no frontal sinus tenderness. Left sinus exhibits maxillary sinus tenderness. Left sinus exhibits no frontal sinus tenderness.  Mouth/Throat: Uvula is midline. No trismus in the jaw. Posterior oropharyngeal erythema present. No oropharyngeal exudate.  Eyes: No scleral icterus.  Neck: Normal range of motion. Neck supple.  Cardiovascular: Normal rate and normal heart sounds.  Pulmonary/Chest: Effort normal and breath sounds normal. No respiratory distress. She has no wheezes. She has no rales. She exhibits no tenderness.  Musculoskeletal: She exhibits no edema.  Lymphadenopathy:    She has no cervical adenopathy.  Neurological: She is alert and oriented to person, place, and time.  Vitals reviewed.   Lab Results  Component Value Date   WBC 9.5 01/14/2017   HGB 14.5 01/14/2017   HCT 42.3 01/14/2017   PLT 221 01/14/2017   GLUCOSE 100 (H) 01/14/2017   CHOL 136 09/22/2016   TRIG 109 09/22/2016   HDL 56 09/22/2016   LDLCALC 58 09/22/2016   ALT 21 09/22/2016   AST 30 09/22/2016   NA 135 01/14/2017   K 3.9 01/14/2017   CL 103 01/14/2017   CREATININE 1.09 (H) 01/14/2017   BUN 25 (H) 01/14/2017   CO2 25 01/14/2017   TSH 1.360 09/22/2016   INR  0.83 01/14/2017   HGBA1C 5.1 09/22/2016    Mm Clip Placement Right  Result Date: 04/06/2017 CLINICAL DATA:  Confirmation of clip placement after stereotactic tomosynthesis core needle biopsy of calcifications involving the upper inner quadrant of the right breast at middle depth. EXAM: DIAGNOSTIC RIGHT MAMMOGRAM POST STEREOTACTIC BIOPSY COMPARISON:  Previous exam(s). FINDINGS: Mammographic images were obtained following stereotactic tomosynthesis guided biopsy of calcifications involving the upper inner quadrant of the right breast. The coil shaped tissue marker clip is appropriately positioned at the site of the biopsied calcifications. The coil clip is approximately 3 mm anterior and 4 mm superior to the remaining calcifications. Expected post biopsy changes are present without evidence of hematoma. IMPRESSION: Appropriate positioning of the coil shaped tissue marker clip at the site of the biopsied calcifications in the upper inner quadrant of the right breast at middle depth. Final Assessment: Post Procedure Mammograms for Marker Placement Electronically Signed   By: Hulan Saas M.D.   On: 04/06/2017 09:33   Mm Rt Breast Bx W Loc Dev 1st Lesion Image Bx Spec Stereo Guide  Addendum Date: 04/11/2017   ADDENDUM REPORT: 04/11/2017 11:41 ADDENDUM: Pathology results of a right breast stereotactic biopsy show usual ductal hyperplasia and apocrine microcysts associated with calcifications. Negative for atypia and malignancy. Pathology results are concordant with imaging findings. I telephoned the patient's pathology results to her today. She reports no problems with the biopsy site. Annual screening mammography is recommended. Electronically Signed   By: Britta Mccreedy M.D.   On: 04/11/2017 11:41   Result Date: 04/11/2017 CLINICAL DATA:  Screening detected indeterminate 3 mm group of calcifications in the upper inner quadrant of the right breast at middle depth. Patient has indwelling subpectoral saline  implants. EXAM: RIGHT BREAST STEREOTACTIC CORE NEEDLE BIOPSY COMPARISON:  Previous exams. FINDINGS: The patient and I discussed the procedure of stereotactic-guided biopsy including benefits and alternatives. We discussed the high likelihood of a successful procedure. We discussed the risks of the procedure including infection, bleeding, tissue injury, clip migration, and inadequate sampling. Informed written consent was given. The usual time out protocol was performed immediately prior to the procedure. Using sterile technique with chlorhexidine as skin antisepsis, 1% lidocaine and 1% lidocaine with epinephrine as local anesthetic, under stereotactic guidance, a 9 gauge Brevera vacuum assisted device was used to perform core needle biopsy of calcifications in the upper inner quadrant of the right breast using a superior approach. Specimen radiograph was performed showing the calcifications in one of the core samples. Specimens with calcifications are identified for pathology. Lesion quadrant: Upper inner quadrant. At the conclusion of the procedure, a coil shaped tissue marker clip was deployed into the biopsy cavity. Follow-up 2-view mammogram was performed and dictated separately. IMPRESSION: Stereotactic-guided biopsy of an indeterminate 3 mm group of calcifications in the upper inner quadrant of the right breast at middle depth. No apparent  complications. Electronically Signed: By: Hulan Saashomas  Lawrence M.D. On: 04/06/2017 09:33    Assessment & Plan:   Brittany Sanchez was seen today for cough.  Diagnoses and all orders for this visit:  Acute bronchitis, unspecified organism -     albuterol (PROVENTIL HFA;VENTOLIN HFA) 108 (90 Base) MCG/ACT inhaler; Inhale 1-2 puffs into the lungs every 6 (six) hours as needed. -     methylPREDNISolone (MEDROL DOSEPAK) 4 MG TBPK tablet; Take as directed on package   I am having Brittany Sanchez start on albuterol and methylPREDNISolone. I am also having her maintain her  progesterone, methocarbamol, meloxicam, Multiple Vitamins-Minerals (MULTIVITAMIN PO), OVER THE COUNTER MEDICATION, ASHWAGANDHA PO, ARMOUR THYROID PO, ondansetron, and oxyCODONE.  Meds ordered this encounter  Medications  . albuterol (PROVENTIL HFA;VENTOLIN HFA) 108 (90 Base) MCG/ACT inhaler    Sig: Inhale 1-2 puffs into the lungs every 6 (six) hours as needed.    Dispense:  1 Inhaler    Refill:  0    Order Specific Question:   Supervising Provider    Answer:   Dianne DunARON, TALIA M [[id number]]  . methylPREDNISolone (MEDROL DOSEPAK) 4 MG TBPK tablet    Sig: Take as directed on package    Dispense:  21 tablet    Refill:  0    Order Specific Question:   Supervising Provider    Answer:   Dianne DunARON, TALIA M [[id number]]    Follow-up: No follow-ups on file.  Alysia Pennaharlotte Jaki Steptoe, NP

## 2017-05-02 ENCOUNTER — Other Ambulatory Visit: Payer: Self-pay | Admitting: Family Medicine

## 2017-05-02 NOTE — Telephone Encounter (Signed)
Different prescriber already sent in/thx dmf

## 2017-05-23 ENCOUNTER — Other Ambulatory Visit: Payer: Self-pay | Admitting: Family Medicine

## 2017-05-23 NOTE — Telephone Encounter (Signed)
Pt has requested the medication last week and the pharmacy states they didn't hear back from the provider about the refill and pt has taken her last one today.

## 2017-05-24 ENCOUNTER — Encounter: Payer: Self-pay | Admitting: Family Medicine

## 2017-05-24 ENCOUNTER — Ambulatory Visit (INDEPENDENT_AMBULATORY_CARE_PROVIDER_SITE_OTHER): Payer: BC Managed Care – PPO | Admitting: Family Medicine

## 2017-05-24 ENCOUNTER — Other Ambulatory Visit: Payer: Self-pay | Admitting: Nurse Practitioner

## 2017-05-24 VITALS — BP 118/74 | HR 60 | Temp 98.8°F | Ht 62.5 in | Wt 142.4 lb

## 2017-05-24 DIAGNOSIS — F419 Anxiety disorder, unspecified: Secondary | ICD-10-CM

## 2017-05-24 DIAGNOSIS — J209 Acute bronchitis, unspecified: Secondary | ICD-10-CM

## 2017-05-24 DIAGNOSIS — Z23 Encounter for immunization: Secondary | ICD-10-CM | POA: Diagnosis not present

## 2017-05-24 DIAGNOSIS — Z0000 Encounter for general adult medical examination without abnormal findings: Secondary | ICD-10-CM

## 2017-05-24 DIAGNOSIS — F329 Major depressive disorder, single episode, unspecified: Secondary | ICD-10-CM

## 2017-05-24 DIAGNOSIS — E559 Vitamin D deficiency, unspecified: Secondary | ICD-10-CM | POA: Diagnosis not present

## 2017-05-24 DIAGNOSIS — E039 Hypothyroidism, unspecified: Secondary | ICD-10-CM

## 2017-05-24 DIAGNOSIS — F32A Depression, unspecified: Secondary | ICD-10-CM

## 2017-05-24 MED ORDER — FLUOXETINE HCL 20 MG PO CAPS: 40.0000 mg | ORAL_CAPSULE | ORAL | 2 refills | Status: DC | PRN

## 2017-05-24 NOTE — Assessment & Plan Note (Addendum)
On armour.  Clinically euthyroid. Thyroid labs normal when checked by GYN in 09/2016. No changes made today.

## 2017-05-24 NOTE — Assessment & Plan Note (Signed)
Reviewed preventive care protocols, scheduled due services, and updated immunizations Discussed nutrition, exercise, diet, and healthy lifestyle.  

## 2017-05-24 NOTE — Assessment & Plan Note (Addendum)
Vit D normal in August when checked by GYN.

## 2017-05-24 NOTE — Assessment & Plan Note (Addendum)
Depression controlled on current dose of prozac. Anxiety deteriorated around her menstrual period but does seem to improve when she increases her dose of prozac.

## 2017-05-24 NOTE — Progress Notes (Addendum)
Subjective:   Patient ID: Brittany Sanchez, female    DOB: 06/02/1970, 47 y.o.   MRN: [id number]  Brittany LocketMarcy Sanchez is a pleasant 47 y.o. year old female who presents to clinic today with Annual Exam (Patient is here today for a CPE without PAP.  [facility] is the office she gets PAP's done at; last one being 8.2018 and was WNL.  She had Coffee with non-fat cream this am only.  She agrees to get her Tdap today.)  on 05/24/2017  HPI   Health Maintenance  Topic Date Due  . HIV Screening  12/02/1984  . INFLUENZA VACCINE  09/01/2017  . PAP SMEAR  09/23/2019  . TETANUS/TDAP  05/25/2027    Anxiety and depression- have been stable on prozac 20 mg daily. Denies any symptoms of depression.  She is having more panic attacks lately, especially around her menstrual cycle.  OBGYN told her okay to take up to 60 mg daily around her menstrual cycle.  Hypothyroidism- on armour.  Due for labs. Lab Results  Component Value Date   TSH 1.360 09/22/2016      Current Outpatient Medications on File Prior to Visit  Medication Sig Dispense Refill  . Multiple Vitamins-Minerals (MULTIVITAMIN PO) Take 2 tablets by mouth daily.    Marland Kitchen. OVER THE COUNTER MEDICATION Take 3 capsules by mouth at bedtime. Lemon Balm Supplement    . progesterone (PROMETRIUM) 100 MG capsule Take 200 mg by mouth daily.     Marland Kitchen. thyroid (ARMOUR) 15 MG tablet Take 15 mg by mouth daily. Take 1qd with 60mg  to equal 75mg  total daily    . thyroid (ARMOUR) 60 MG tablet Take 60 mg by mouth daily before breakfast. Take 1qd with 15mg  to equal 75mg  total daily     No current facility-administered medications on file prior to visit.     No Known Allergies  Past Medical History:  Diagnosis Date  . Hypothyroidism   . Lyme disease   . PMS (premenstrual syndrome)     Past Surgical History:  Procedure Laterality Date  . AUGMENTATION MAMMAPLASTY Bilateral 1998  . BREAST BIOPSY Right 04/06/2017   affirm bx coil marker, path pending  . BREAST  ENHANCEMENT SURGERY    . SHOULDER ARTHROSCOPY WITH LABRAL REPAIR Right 01/19/2017   Procedure: SHOULDER ARTHROSCOPY WITH LABRAL REPAIR, SLAP REPAIR;  Surgeon: Juanell FairlyKrasinski, Kevin, MD;  Location: [facility] ORS;  Service: Orthopedics;  Laterality: Right;    Family History  Problem Relation Age of Onset  . Cancer Mother [id number]       colon cancer  . Sleep apnea Brother   . Alcohol abuse Other   . Depression Other   . Cancer Father [id number]       mesothelioma  . Cancer Maternal Grandmother        ? ovarian  . Diabetes Maternal Grandfather   . Breast cancer Neg Hx     Social History   Socioeconomic History  . Marital status: Married    Spouse name: Not on file  . Number of children: 2  . Years of education: Not on file  . Highest education level: Not on file  Occupational History  . 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 Financial resource strain: Not on file  . Food insecurity:    Worry: Not on file    Inability: Not on file  . Transportation needs:    Medical: Not on file    Non-medical:  Not on file  Tobacco Use  . Smoking status: Never Smoker  . Smokeless tobacco: Never Used  Substance and Sexual Activity  . Alcohol use: Yes    Alcohol/week: 0.0 oz    Comment: OCC  . Drug use: No  . Sexual activity: Yes    Partners: Male    Birth control/protection: Rhythm  Lifestyle  . Physical activity:    Days per week: Not on file    Minutes per session: Not on file  . Stress: Not on file  Relationships  . Social connections:    Talks on phone: Not on file    Gets together: Not on file    Attends religious service: Not on file    Active member of club or organization: Not on file    Attends meetings of clubs or organizations: Not on file    Relationship status: Not on file  . Intimate partner violence:    Fear of current or ex partner: Not on file    Emotionally abused: Not on file    Physically abused: Not on  file    Forced sexual activity: Not on file  Other Topics Concern  . Not on file  Social History Narrative   Gym 3-4 times a week.     Lives with husband in a two story home.  Has 2 children.    Teaches middle school, ESL.     Highest level of education:  Masters   The PMH, PSH, Social History, Family History, Medications, and allergies have been reviewed in [facility], and have been updated if relevant.   Review of Systems  Constitutional: Negative.   HENT: Negative.   Eyes: Negative.   Respiratory: Negative.   Cardiovascular: Negative.   Gastrointestinal: Negative.   Endocrine: Negative.   Genitourinary: Negative.   Musculoskeletal: Negative.   Skin: Negative.   Allergic/Immunologic: Negative.   Neurological: Negative.   Hematological: Negative.   Psychiatric/Behavioral: Negative for agitation, behavioral problems, confusion, decreased concentration, dysphoric mood, hallucinations, self-injury, sleep disturbance and suicidal ideas. The patient is nervous/anxious. The patient is not hyperactive.   All other systems reviewed and are negative.      Objective:    BP 118/74 (BP Location: Left Arm, Patient Position: Sitting, Cuff Size: Normal)   Pulse 60   Temp 98.8 F (37.1 C) (Oral)   Ht 5' 2.5" (1.588 m)   Wt 142 lb 6.4 oz (64.6 kg)   SpO2 98%   BMI 25.63 kg/m    Physical Exam   General:  Well-developed,well-nourished,in no acute distress; alert,appropriate and cooperative throughout examination Head:  normocephalic and atraumatic.   Eyes:  vision grossly intact, PERRL Ears:  R ear normal and L ear normal externally, TMs clear bilaterally Nose:  no external deformity.   Mouth:  good dentition.   Neck:  No deformities, masses, or tenderness noted. Breasts:  No mass, nodules, thickening, tenderness, bulging, retraction, inflamation, nipple discharge or skin changes noted.   Lungs:  Normal respiratory effort, chest expands symmetrically. Lungs are clear to auscultation,  no crackles or wheezes. Heart:  Normal rate and regular rhythm. S1 and S2 normal without gallop, murmur, click, rub or other extra sounds. Abdomen:  Bowel sounds positive,abdomen soft and non-tender without masses, organomegaly or hernias noted. Msk:  No deformity or scoliosis noted of thoracic or lumbar spine.   Extremities:  No clubbing, cyanosis, edema, or deformity noted with normal full range of motion of all joints.   Neurologic:  alert & oriented X3  and gait normal.   Skin:  Intact without suspicious lesions or rashes Cervical Nodes:  No lymphadenopathy noted Axillary Nodes:  No palpable lymphadenopathy Psych:  Cognition and judgment appear intact. Alert and cooperative with normal attention span and concentration. No apparent delusions, illusions, hallucinations       Assessment & Plan:   Well woman exam without gynecological exam - Plan: CANCELED: CBC with Differential/Platelet  Anxiety and depression - Plan: CANCELED: CBC with Differential/Platelet  Need for Tdap vaccination - Plan: Tdap vaccine greater than or equal to 7yo IM  Hypothyroidism, unspecified type - Plan: CANCELED: CBC with Differential/Platelet, CANCELED: Comprehensive metabolic panel, CANCELED: Lipid panel, CANCELED: TSH, CANCELED: T4, free  Vitamin D deficiency - Plan: CANCELED: Vitamin D (25 hydroxy) No follow-ups on file.

## 2017-05-24 NOTE — Patient Instructions (Addendum)
Great to see you!   

## 2017-06-04 ENCOUNTER — Other Ambulatory Visit: Payer: Self-pay | Admitting: Nurse Practitioner

## 2017-06-04 DIAGNOSIS — J209 Acute bronchitis, unspecified: Secondary | ICD-10-CM

## 2017-06-05 ENCOUNTER — Encounter: Payer: Self-pay | Admitting: Nurse Practitioner

## 2017-09-23 ENCOUNTER — Encounter: Payer: Self-pay | Admitting: Obstetrics and Gynecology

## 2017-09-23 ENCOUNTER — Ambulatory Visit (INDEPENDENT_AMBULATORY_CARE_PROVIDER_SITE_OTHER): Payer: BC Managed Care – PPO | Admitting: Obstetrics and Gynecology

## 2017-09-23 VITALS — BP 106/69 | HR 59 | Ht 62.0 in | Wt 141.9 lb

## 2017-09-23 DIAGNOSIS — N943 Premenstrual tension syndrome: Secondary | ICD-10-CM

## 2017-09-23 DIAGNOSIS — E538 Deficiency of other specified B group vitamins: Secondary | ICD-10-CM | POA: Diagnosis not present

## 2017-09-23 DIAGNOSIS — Z01419 Encounter for gynecological examination (general) (routine) without abnormal findings: Secondary | ICD-10-CM

## 2017-09-23 MED ORDER — ARIPIPRAZOLE 2 MG PO TABS: 2.0000 mg | ORAL_TABLET | Freq: Every day | ORAL | 4 refills | Status: DC

## 2017-09-23 MED ORDER — PROGESTERONE MICRONIZED 100 MG PO CAPS: 200.0000 mg | ORAL_CAPSULE | Freq: Every day | ORAL | 11 refills | Status: DC

## 2017-09-23 NOTE — Progress Notes (Signed)
Subjective:   Brittany Sanchez is a 47 y.o. G1P2002 Caucasian female here for a routine well-woman exam.  No LMP recorded. Patient has had an ablation.    Current complaints: states increased tearfulness and just "shuts down" with PMS and increased stressors. 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 Still spotting every couple months very little.  PCP: Clifton Custard       does desire labs  Social History: Sexual: heterosexual Marital Status: married Living situation: with family Occupation: Runner, broadcasting/film/video  Tobacco/alcohol: no tobacco use Illicit drugs: no history of illicit drug use  The following portions of the patient's history were reviewed and updated as appropriate: allergies, current medications, past family history, past medical history, past social history, past surgical history and problem list.  Past Medical History Past Medical History:  Diagnosis Date  . Hypothyroidism   . Lyme disease   . PMS (premenstrual syndrome)     Past Surgical History Past Surgical History:  Procedure Laterality Date  . AUGMENTATION MAMMAPLASTY Bilateral 1998  . BREAST BIOPSY Right 04/06/2017   affirm bx coil marker, path pending  . BREAST ENHANCEMENT SURGERY    . SHOULDER ARTHROSCOPY WITH LABRAL REPAIR Right 01/19/2017   Procedure: SHOULDER ARTHROSCOPY WITH LABRAL REPAIR, SLAP REPAIR;  Surgeon: Juanell Fairly, MD;  Location: [facility] ORS;  Service: Orthopedics;  Laterality: Right;    Gynecologic History G2P2002  No LMP recorded. Patient has had an ablation. Contraception: tubal ligation Last Pap: 2018. Results were: normal Last mammogram: 04/2017. Results were: abnormal with negative biopsy on right   Obstetric History OB History  Gravida Para Term Preterm AB Living  2 2 2     2   SAB TAB Ectopic Multiple Live Births          2    # Outcome Date GA Lbr Len/2nd Weight Sex Delivery Anes PTL Lv  2 Term      Vag-Spont   LIV  1 Term      Vag-Spont   LIV    Current  Medications Current Outpatient Medications on File Prior to Visit  Medication Sig Dispense Refill  . FLUoxetine (PROZAC) 20 MG capsule Take 2 capsules (40 mg total) by mouth as needed. 74 capsule 2  . OVER THE COUNTER MEDICATION Take 3 capsules by mouth at bedtime. Lemon Balm Supplement    . progesterone (PROMETRIUM) 100 MG capsule Take 200 mg by mouth daily.     Marland Kitchen thyroid (ARMOUR) 15 MG tablet Take 15 mg by mouth daily. Take 1qd with 60mg  to equal 75mg  total daily    . thyroid (ARMOUR) 60 MG tablet Take 60 mg by mouth daily before breakfast. Take 1qd with 15mg  to equal 75mg  total daily    . albuterol (PROVENTIL HFA;VENTOLIN HFA) 108 (90 Base) MCG/ACT inhaler INHALE 1-2 PUFFS INTO THE LUNGS EVERY 6 (SIX) HOURS AS NEEDED. (Patient not taking: Reported on 09/23/2017) 8.5 Inhaler 0  . Multiple Vitamins-Minerals (MULTIVITAMIN PO) Take 2 tablets by mouth daily.     No current facility-administered medications on file prior to visit.     Review of Systems Patient denies any headaches, blurred vision, shortness of breath, chest pain, abdominal pain, problems with bowel movements, urination, or intercourse.  Objective:  BP 106/69   Pulse (!) 59   Ht 5\' 2"  (1.575 m)   Wt 141 lb 14.4 oz (64.4 kg)   BMI 25.95 kg/m  Physical Exam  General:  Well developed, well nourished, no acute distress. She is alert and  oriented x3. Skin:  Warm and dry Neck:  Midline trachea, no thyromegaly or nodules Cardiovascular: Regular rate and rhythm, no murmur heard Lungs:  Effort normal, all lung fields clear to auscultation bilaterally Breasts:  No dominant palpable mass, retraction, or nipple discharge Abdomen:  Soft, non tender, no hepatosplenomegaly or masses Pelvic:  External genitalia is normal in appearance.  The vagina is normal in appearance. The cervix is bulbous, no CMT.  Thin prep pap is not done . Uterus is felt to be normal size, shape, and contour.  No adnexal masses or tenderness noted. Extremities:   No swelling or varicosities noted Psych:  She has a normal mood and affect  Assessment:   Healthy well-woman exam B12 deficiency PMDD Depression on SSRI  Plan:  Labs obtained-will follow up accordingly Will add abilify as needed, rx sent in. hormaone levels obtained.  F/U 1 Year for AE, or sooner if needed   Lashanta Elbe Suzan NailerN Shaquille Janes, CNM

## 2017-09-24 LAB — COMPREHENSIVE METABOLIC PANEL
ALT: 14 IU/L (ref 0–32)
AST: 16 IU/L (ref 0–40)
Albumin/Globulin Ratio: 2.4 — ABNORMAL HIGH (ref 1.2–2.2)
Albumin: 4.5 g/dL (ref 3.5–5.5)
Alkaline Phosphatase: 38 IU/L — ABNORMAL LOW (ref 39–117)
BUN/Creatinine Ratio: 22 (ref 9–23)
BUN: 22 mg/dL (ref 6–24)
Bilirubin Total: 0.3 mg/dL (ref 0.0–1.2)
CO2: 23 mmol/L (ref 20–29)
Calcium: 9.4 mg/dL (ref 8.7–10.2)
Chloride: 103 mmol/L (ref 96–106)
Creatinine, Ser: 0.98 mg/dL (ref 0.57–1.00)
GFR calc Af Amer: 79 mL/min/{1.73_m2} (ref >59)
GFR calc non Af Amer: 69 mL/min/{1.73_m2} (ref >59)
Globulin, Total: 1.9 g/dL (ref 1.5–4.5)
Glucose: 62 mg/dL — ABNORMAL LOW (ref 65–99)
Potassium: 4.3 mmol/L (ref 3.5–5.2)
Sodium: 140 mmol/L (ref 134–144)
Total Protein: 6.4 g/dL (ref 6.0–8.5)

## 2017-09-24 LAB — LIPID PANEL
CHOL/HDL RATIO: 2.8 ratio (ref 0.0–4.4)
CHOLESTEROL TOTAL: 128 mg/dL (ref 100–199)
HDL: 45 mg/dL (ref >39)
LDL CALC: 64 mg/dL (ref 0–99)
Triglycerides: 95 mg/dL (ref 0–149)
VLDL Cholesterol Cal: 19 mg/dL (ref 5–40)

## 2017-09-24 LAB — B12 AND FOLATE PANEL
Folate: 11.3 ng/mL (ref >3.0)
VITAMIN B 12: 528 pg/mL (ref 232–1245)

## 2017-09-24 LAB — PROGESTERONE: Progesterone: 2.1 ng/mL

## 2017-09-24 LAB — THYROID PANEL WITH TSH
Free Thyroxine Index: 1.9 (ref 1.2–4.9)
T3 Uptake Ratio: 29 % (ref 24–39)
T4, Total: 6.4 ug/dL (ref 4.5–12.0)
TSH: 0.837 u[IU]/mL (ref 0.450–4.500)

## 2017-09-24 LAB — ESTRADIOL: Estradiol: 11 pg/mL

## 2017-09-24 LAB — FSH/LH
FSH: 21.6 m[IU]/mL
LH: 13.4 m[IU]/mL

## 2017-10-28 ENCOUNTER — Other Ambulatory Visit: Payer: Self-pay | Admitting: Family Medicine

## 2018-01-30 NOTE — Progress Notes (Deleted)
There were no vitals taken for this visit.   CC: *** Subjective:    Patient ID: Brittany Sanchez, female    DOB: 04/07/1970, 47 y.o.   MRN: [id number]  HPI: Brittany Sanchez is a 47 y.o. female presenting on 01/31/2018 for No chief complaint on file.    ***      Relevant past medical, surgical, family and social history reviewed and updated as indicated. Interim medical history since our last visit reviewed. Allergies and medications reviewed and updated. Outpatient Medications Prior to Visit  Medication Sig Dispense Refill  . albuterol (PROVENTIL HFA;VENTOLIN HFA) 108 (90 Base) MCG/ACT inhaler INHALE 1-2 PUFFS INTO THE LUNGS EVERY 6 (SIX) HOURS AS NEEDED. (Patient not taking: Reported on 09/23/2017) 8.5 Inhaler 0  . ARIPiprazole (ABILIFY) 2 MG tablet Take 1 tablet (2 mg total) by mouth daily. 30 tablet 4  . FLUoxetine (PROZAC) 20 MG capsule TAKE 2 CAPSULES (40 MG TOTAL) BY MOUTH AS NEEDED. 180 capsule 2  . Multiple Vitamins-Minerals (MULTIVITAMIN PO) Take 2 tablets by mouth daily.    Marland Kitchen. OVER THE COUNTER MEDICATION Take 3 capsules by mouth at bedtime. Lemon Balm Supplement    . progesterone (PROMETRIUM) 100 MG capsule Take 2 capsules (200 mg total) by mouth daily. 60 capsule 11  . thyroid (ARMOUR) 15 MG tablet Take 15 mg by mouth daily. Take 1qd with 60mg  to equal 75mg  total daily    . thyroid (ARMOUR) 60 MG tablet Take 60 mg by mouth daily before breakfast. Take 1qd with 15mg  to equal 75mg  total daily    . traZODone (DESYREL) 50 MG tablet Take 25 mg by mouth as needed.     No facility-administered medications prior to visit.      Per HPI unless specifically indicated in ROS section below Review of Systems Objective:    There were no vitals taken for this visit.  Wt Readings from Last 3 Encounters:  09/23/17 141 lb 14.4 oz (64.4 kg)  05/24/17 142 lb 6.4 oz (64.6 kg)  04/27/17 144 lb (65.3 kg)    Physical Exam    Results for orders placed or performed in visit on 09/23/17    Thyroid Panel With TSH  Result Value Ref Range   TSH 0.837 0.450 - 4.500 uIU/mL   T4, Total 6.4 4.5 - 12.0 ug/dL   T3 Uptake Ratio 29 24 - 39 %   Free Thyroxine Index 1.9 1.2 - 4.9  Lipid panel  Result Value Ref Range   Cholesterol, Total 128 100 - 199 mg/dL   Triglycerides 95 0 - 149 mg/dL   HDL 45 >64>39 mg/dL   VLDL Cholesterol Cal 19 5 - 40 mg/dL   LDL Calculated 64 0 - 99 mg/dL   Chol/HDL Ratio 2.8 0.0 - 4.4 ratio  Comprehensive metabolic panel  Result Value Ref Range   Glucose 62 (L) 65 - 99 mg/dL   BUN 22 6 - 24 mg/dL   Creatinine, Ser 4.030.98 0.57 - 1.00 mg/dL   GFR calc non Af Amer 69 >59 mL/min/1.73   GFR calc Af Amer 79 >59 mL/min/1.73   BUN/Creatinine Ratio 22 9 - 23   Sodium 140 134 - 144 mmol/L   Potassium 4.3 3.5 - 5.2 mmol/L   Chloride 103 96 - 106 mmol/L   CO2 23 20 - 29 mmol/L   Calcium 9.4 8.7 - 10.2 mg/dL   Total Protein 6.4 6.0 - 8.5 g/dL   Albumin 4.5 3.5 - 5.5 g/dL   Globulin, Total 1.9  1.5 - 4.5 g/dL   Albumin/Globulin Ratio 2.4 (H) 1.2 - 2.2   Bilirubin Total 0.3 0.0 - 1.2 mg/dL   Alkaline Phosphatase 38 (L) 39 - 117 IU/L   AST 16 0 - 40 IU/L   ALT 14 0 - 32 IU/L  FSH/LH  Result Value Ref Range   LH 13.4 mIU/mL   FSH 21.6 mIU/mL  Estradiol  Result Value Ref Range   Estradiol 11.0 pg/mL  Progesterone  Result Value Ref Range   Progesterone 2.1 ng/mL  B12 and Folate Panel  Result Value Ref Range   Vitamin B-12 528 232 - 1,245 pg/mL   Folate 11.3 >3.0 ng/mL   Assessment & Plan:   Problem List Items Addressed This Visit    None       No orders of the defined types were placed in this encounter.  No orders of the defined types were placed in this encounter.   Follow up plan: No follow-ups on file.  Eustaquio BoydenJavier Ralene Gasparyan, MD

## 2018-01-31 ENCOUNTER — Ambulatory Visit: Payer: BC Managed Care – PPO | Admitting: Family Medicine

## 2018-02-02 ENCOUNTER — Ambulatory Visit: Payer: BC Managed Care – PPO | Admitting: Family Medicine

## 2018-02-02 ENCOUNTER — Ambulatory Visit: Payer: Self-pay

## 2018-02-02 ENCOUNTER — Encounter: Payer: Self-pay | Admitting: Family Medicine

## 2018-02-02 VITALS — BP 100/70 | HR 70 | Temp 97.9°F | Ht 62.0 in | Wt 142.8 lb

## 2018-02-02 DIAGNOSIS — J40 Bronchitis, not specified as acute or chronic: Secondary | ICD-10-CM

## 2018-02-02 MED ORDER — PREDNISONE 20 MG PO TABS: 40.0000 mg | ORAL_TABLET | Freq: Every day | ORAL | 0 refills | Status: DC

## 2018-02-02 MED ORDER — CLARITHROMYCIN 500 MG PO TABS: 500.0000 mg | ORAL_TABLET | Freq: Two times a day (BID) | ORAL | 0 refills | Status: AC

## 2018-02-02 MED ORDER — BENZONATATE 100 MG PO CAPS: 100.0000 mg | ORAL_CAPSULE | Freq: Three times a day (TID) | ORAL | 0 refills | Status: DC | PRN

## 2018-02-02 NOTE — Telephone Encounter (Signed)
Pt c/o dry non productive cough starting 01/22/18. Pt stated cough is keeping her up at night. Pt is having mild SOB and occasional lightheadedness. Pt has tried taking Mucinex DM, sore throat, acetaminophen and her daughters hydrocodone cough syrup. Pt stated she is concerned that she has pneumonia. Pt stated she thinks the sore throat is from the post nasal drip. Pt c/o back sore "from coughing." Pt given care advice and pt verbalized understanding. Pt given an appointment today.  Reason for Disposition . [1] Continuous (nonstop) coughing interferes with work or school AND [2] no improvement using cough treatment per protocol  Answer Assessment - Initial Assessment Questions 1. ONSET: "When did the cough begin?"      01/22/18 2. SEVERITY: "How bad is the cough today?"      Cough kept pt up all night 3. RESPIRATORY DISTRESS: "Describe your breathing."      SOBmild 4. FEVER: "Do you have a fever?" If so, ask: "What is your temperature, how was it measured, and when did it start?"     No-swaety after taking nap and hands and feet are cold 5. HEMOPTYSIS: "Are you coughing up any blood?" If so ask: "How much?" (flecks, streaks, tablespoons, etc.)     no 6. TREATMENT: "What have you done so far to treat the cough?" (e.g., meds, fluids, humidifier)     Mucinex DM, Tylenol, daughters hydrocodone cough syrup. 7. CARDIAC HISTORY: "Do you have any history of heart disease?" (e.g., heart attack, congestive heart failure)      no 8. LUNG HISTORY: "Do you have any history of lung disease?"  (e.g., pulmonary embolus, asthma, emphysema)     no 9. PE RISK FACTORS: "Do you have a history of blood clots?" (or: recent major surgery, recent prolonged travel, bedridden)     no 10. OTHER SYMPTOMS: "Do you have any other symptoms? (e.g., runny nose, wheezing, chest pain)       Dry cough, lightheadedness, sore throat, post nasal drip, back is sore from coughing 11. PREGNANCY: "Is there any chance you are  pregnant?" "When was your last menstrual period?"       No= LMP: December 2019 12. TRAVEL: "Have you traveled out of the country in the last month?" (e.g., travel history, exposures)       no  Protocols used: COUGH - ACUTE NON-PRODUCTIVE-A-AH

## 2018-02-02 NOTE — Progress Notes (Signed)
Brittany Sanchez is a 48 y.o. female  Chief Complaint  Patient presents with  . Cough    cough with green mucus, fatigue, dizzy, started x9 days/ OTC musinex DM and old amoxicillin (old rx from 3 yrs ago)    HPI: Brittany Sanchez is a 48 y.o. female complains of cough x 1.5 wks that was improving but now worse in past 48 hours. Cough is more frequent and now productive. + fatigue. + lightheadedness when coughing. + subjective fever, no chills. No myalgias. No ear pain/fullness. Minimal runny nose, nasal congestion. No sore throat. Some PND. No GI symptoms.  Pt has been taking Mucinex and 49 year old amoxicillin - she has taken 2500mg  of amoxicillin in the past 2 days.  Past Medical History:  Diagnosis Date  . Hypothyroidism   . Lyme disease   . PMS (premenstrual syndrome)     Past Surgical History:  Procedure Laterality Date  . AUGMENTATION MAMMAPLASTY Bilateral 1998  . BREAST BIOPSY Right 04/06/2017   affirm bx coil marker, path pending  . BREAST ENHANCEMENT SURGERY    . SHOULDER ARTHROSCOPY WITH LABRAL REPAIR Right 01/19/2017   Procedure: SHOULDER ARTHROSCOPY WITH LABRAL REPAIR, SLAP REPAIR;  Surgeon: Juanell Fairly, MD;  Location: [facility] ORS;  Service: Orthopedics;  Laterality: Right;    Social History   Socioeconomic History  . Marital status: Married    Spouse name: Not on file  . Number of children: 2  . Years of education: Not on file  . Highest education level: Not on file  Occupational History  . 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  Social Needs  . Financial resource strain: Not on file  . Food insecurity:    Worry: Not on file    Inability: Not on file  . Transportation needs:    Medical: Not on file    Non-medical: Not on file  Tobacco Use  . Smoking status: Never Smoker  . Smokeless tobacco: Never Used  Substance and Sexual Activity  . Alcohol use: Yes    Alcohol/week: 0.0 standard drinks   Comment: OCC  . Drug use: No  . Sexual activity: Yes    Partners: Male    Birth control/protection: Rhythm  Lifestyle  . Physical activity:    Days per week: Not on file    Minutes per session: Not on file  . Stress: Not on file  Relationships  . Social connections:    Talks on phone: Not on file    Gets together: Not on file    Attends religious service: Not on file    Active member of club or organization: Not on file    Attends meetings of clubs or organizations: Not on file    Relationship status: Not on file  . Intimate partner violence:    Fear of current or ex partner: Not on file    Emotionally abused: Not on file    Physically abused: Not on file    Forced sexual activity: Not on file  Other Topics Concern  . Not on file  Social History Narrative   Gym 3-4 times a week.     Lives with husband in a two story home.  Has 2 children.    Teaches middle school, ESL.     Highest level of education:  Masters    Family History  Problem Relation Age of Onset  . Cancer Mother 58       colon cancer  .  Sleep apnea Brother   . Alcohol abuse Other   . Depression Other   . Cancer Father 7050       mesothelioma  . Cancer Maternal Grandmother        ? ovarian  . Diabetes Maternal Grandfather   . Breast cancer Neg Hx      Immunization History  Administered Date(s) Administered  . Td 08/05/2005  . Tdap 05/24/2017    Outpatient Encounter Medications as of 02/02/2018  Medication Sig  . albuterol (PROVENTIL HFA;VENTOLIN HFA) 108 (90 Base) MCG/ACT inhaler INHALE 1-2 PUFFS INTO THE LUNGS EVERY 6 (SIX) HOURS AS NEEDED.  Marland Kitchen. FLUoxetine (PROZAC) 20 MG capsule TAKE 2 CAPSULES (40 MG TOTAL) BY MOUTH AS NEEDED.  . Multiple Vitamins-Minerals (MULTIVITAMIN PO) Take 2 tablets by mouth daily.  Marland Kitchen. OVER THE COUNTER MEDICATION Take 3 capsules by mouth at bedtime. Lemon Balm Supplement  . progesterone (PROMETRIUM) 100 MG capsule Take 2 capsules (200 mg total) by mouth daily.  Marland Kitchen. thyroid  (ARMOUR) 15 MG tablet Take 15 mg by mouth daily. Take 1qd with 60mg  to equal 75mg  total daily  . thyroid (ARMOUR) 60 MG tablet Take 60 mg by mouth daily before breakfast. Take 1qd with 15mg  to equal 75mg  total daily  . traZODone (DESYREL) 50 MG tablet Take 25 mg by mouth as needed.  . ARIPiprazole (ABILIFY) 2 MG tablet Take 1 tablet (2 mg total) by mouth daily. (Patient not taking: Reported on 02/02/2018)   No facility-administered encounter medications on file as of 02/02/2018.      ROS: Pertinent positives and negatives noted in HPI. Remainder of ROS non-contributory   No Known Allergies  BP 100/70   Pulse 70   Temp 97.9 F (36.6 C) (Oral)   Ht 5\' 2"  (1.575 m)   Wt 142 lb 12.8 oz (64.8 kg)   SpO2 97%   BMI 26.12 kg/m   Physical Exam  Constitutional: She is oriented to person, place, and time. She appears well-developed and well-nourished. No distress.  HENT:  Head: Normocephalic and atraumatic.  Eyes: Pupils are equal, round, and reactive to light. Conjunctivae and EOM are normal. Right eye exhibits no discharge. Left eye exhibits no discharge.  Neck: Neck supple.  Cardiovascular: Normal rate, regular rhythm and normal heart sounds.  Pulmonary/Chest: Effort normal. No stridor. No respiratory distress. She has wheezes (faint B/L expir wheeze and coarse BS B/L). She has no rhonchi.  Lymphadenopathy:    She has no cervical adenopathy.  Neurological: She is alert and oriented to person, place, and time.  Skin: Skin is warm and dry.  Psychiatric: She has a normal mood and affect.     A/P:  1. Bronchitis - symptoms x 1.5 weeks and worsening in the past 48 hrs - cont supportive care Rx: - predniSONE (DELTASONE) 20 MG tablet; Take 2 tablets (40 mg total) by mouth daily with breakfast for 5 days.  Dispense: 10 tablet; Refill: 0 - benzonatate (TESSALON) 100 MG capsule; Take 1 capsule (100 mg total) by mouth 3 (three) times daily as needed for cough.  Dispense: 30 capsule; Refill:  0 - clarithromycin (BIAXIN) 500 MG tablet; Take 1 tablet (500 mg total) by mouth 2 (two) times daily for 7 days.  Dispense: 14 tablet; Refill: 0 - work note given - f/u if symptoms worsen or do not improve in 7-10 days Discussed plan and reviewed medications with patient, including risks, benefits, and potential side effects. Pt expressed understand. All questions answered.

## 2018-02-03 ENCOUNTER — Telehealth: Payer: Self-pay | Admitting: Family Medicine

## 2018-02-03 MED ORDER — GUAIFENESIN-CODEINE 100-10 MG/5ML PO SYRP: 5.0000 mL | ORAL_SOLUTION | Freq: Four times a day (QID) | ORAL | 0 refills | Status: DC | PRN

## 2018-02-03 NOTE — Telephone Encounter (Signed)
Rx for cheratussin cough syrup to pharm

## 2018-02-03 NOTE — Telephone Encounter (Signed)
I called and spoke with patient, she is aware that Rx was sent to pharmacy.

## 2018-02-03 NOTE — Telephone Encounter (Signed)
Copied from CRM [id number]. Topic: Quick Communication - See Telephone Encounter >> Feb 03, 2018 12:05 PM Lorrine Kin, NT wrote: CRM for notification. See Telephone encounter for: 02/03/18. Patient calling and states that she was seen yesterday and was offered cough syrup and declined due to having some at home. States that she had to use that all night and now does not have any. Would like to know if a cough syrup could be called in? Also, states that she has hydrocodone at home that helps with  her cough during the day, but makes her hyper at night. Is there another alternative? CB#: [phone] [facility] - [place], [place] - [address]

## 2018-02-06 ENCOUNTER — Ambulatory Visit: Payer: Self-pay

## 2018-02-06 ENCOUNTER — Telehealth: Payer: Self-pay | Admitting: Family Medicine

## 2018-02-06 NOTE — Telephone Encounter (Signed)
See nurse triage encounter.

## 2018-02-06 NOTE — Telephone Encounter (Signed)
Copied from CRM [id number]. Topic: General - Other >> Feb 06, 2018  9:50 AM Maye Hidesoley, Sarah wrote: Reason for CRM: Pt states she is still having some shortness of breath but cough has calmed down. She wants to know if she should take another round of steroids or just wait a few days.Just states she does not want to go backwards.She uses [facility] [address] [place] - [id number] [phone] (Fax)

## 2018-02-06 NOTE — Telephone Encounter (Signed)
Patient called in and says "I am still having SOB and want to know if I should take another round of Prednisone or just wait for a few more days. The cough is better, however I only take the Cheratussin at night. I have been taking Hydrocodone-Homatropine 5 mg during the day for my cough and it has been working. This is a prescription my daughter had and was in the cabinet, so I started using it. I would like a prescription for Hydrocodone during the day." I asked about using the Cheratussin as prescribed during the day, she says "with codeine, I was afraid it would make me drowsy during the day, so I haven't tried it." I asked about the difficulty breathing, she says "it's the same as it was when I came to the office 6 days ago, no change. No SOB at rest, but when I get up moving around, I have SOB." I asked about other symptoms, she says "dizziness and cough." I advised that Dr. Barron Alvine is not in the office today, she says "just sent the request to Dr. Dayton Martes and see if she will prescribe it and let me know about the prednisone."     Copied from CRM [id number]. Topic: General - Other >> Feb 06, 2018  9:50 AM Brittany Sanchez wrote: Reason for CRM: Pt states she is still having some shortness of breath but cough has calmed down. She wants to know if she should take another round of steroids or just wait a few days.Just states she does not want to go backwards.She uses [facility] [address] - [place], [place] - [address]           [phone] (Phone) [phone] (Fax)  Reason for Disposition . [1] MILD difficulty breathing (e.g., minimal/no SOB at rest, SOB with walking, pulse <100) AND [2] NEW-onset or WORSE than normal  Answer Assessment - Initial Assessment Questions 1. RESPIRATORY STATUS: "Describe your breathing?" (e.g., wheezing, shortness of breath, unable to speak, severe coughing)      Shortness of breath 2. ONSET: "When did this breathing problem begin?"      Same as what I went  to the office for 6 days ago 3. PATTERN "Does the difficult breathing come and go, or has it been constant since it started?"      Constant 4. SEVERITY: "How bad is your breathing?" (e.g., mild, moderate, severe)    - MILD: No SOB at rest, mild SOB with walking, speaks normally in sentences, can lay down, no retractions, pulse < 100.    - MODERATE: SOB at rest, SOB with minimal exertion and prefers to sit, cannot lie down flat, speaks in phrases, mild retractions, audible wheezing, pulse 100-120.    - SEVERE: Very SOB at rest, speaks in single words, struggling to breathe, sitting hunched forward, retractions, pulse > 120      Mild 5. RECURRENT SYMPTOM: "Have you had difficulty breathing before?" If so, ask: "When was the last time?" and "What happened that time?"      Yes once a year for the past 2 years in a row 6. CARDIAC HISTORY: "Do you have any history of heart disease?" (e.g., heart attack, angina, bypass surgery, angioplasty)      No 7. LUNG HISTORY: "Do you have any history of lung disease?"  (e.g., pulmonary embolus, asthma, emphysema)     No 8. CAUSE: "What do you think is causing the breathing problem?"      Possible bronchitis 9. OTHER SYMPTOMS: "  Do you have any other symptoms? (e.g., dizziness, runny nose, cough, chest pain, fever)     Dizziness, cough 10. PREGNANCY: "Is there any chance you are pregnant?" "When was your last menstrual period?"       No 11. TRAVEL: "Have you traveled out of the country in the last month?" (e.g., travel history, exposures)       No  Protocols used: BREATHING DIFFICULTY-A-AH

## 2018-02-07 ENCOUNTER — Telehealth: Payer: Self-pay | Admitting: Family Medicine

## 2018-02-07 NOTE — Telephone Encounter (Signed)
Copied from CRM [id number]. Topic: Quick Communication - See Telephone Encounter >> Feb 07, 2018  1:32 PM Mickel BaasMcGee, Skeeter Sheard B, NT wrote: CRM for notification. See Telephone encounter for: 02/07/18.  Please refer to Nurse Triage call from 02/06/2018. Patient calling to get an update from her call yesterday. Please advise.

## 2018-02-08 DIAGNOSIS — J209 Acute bronchitis, unspecified: Secondary | ICD-10-CM | POA: Insufficient documentation

## 2018-02-08 MED ORDER — HYDROCODONE-HOMATROPINE 5-1.5 MG/5ML PO SYRP: 5.0000 mL | ORAL_SOLUTION | Freq: Three times a day (TID) | ORAL | 0 refills | Status: DC | PRN

## 2018-02-08 NOTE — Telephone Encounter (Signed)
TA-Pt was seen by [facility] on 1.2.20 and Dx with Bronchitis/she was Tx with Clarithromycin 500mg  bid x7d; Prednisone 40mg  x5d; and Benzonatate 100mg  tid prn/She is still experiencing SOB and is asking if it would be appropriate for another round of Prednisone be done?  Also: She has been taking an old Rx of her daughters of Hycodan for her cough which has helped but would like to ask for an Rx for that as it is almost gone/plz advise on both Prednisone and Hycodan/thx dmf

## 2018-02-08 NOTE — Telephone Encounter (Signed)
I will send in the hycodan now but I really need her to be seen for any further treatment.  Can you schedule her with someone this afternoon?

## 2018-02-08 NOTE — Progress Notes (Deleted)
Subjective:   Patient ID: Brittany Sanchez, female    DOB: 08-Jan-1971, 48 y.o.   MRN: [id number]  Brittany Sanchez is a pleasant 49 y.o. year old female who presents to clinic today with No chief complaint on file.  on 02/09/2018  HPI:  Here for persistent symptoms.  She was seen by Dr. Barron Alvine on 02/02/18 ( 1 week ago) for URI symptoms. Note reviewed.  At that OV she complained of cough for 1.5 weeks that had become acutely worse over the 48 hours prior to her OV- more frequent and more productive. She also complained of fatigue, subjective fever and congestion. Per her note, she had wheezes and coarse breath sounds on exam.  Diagnosed with bronchitis and treated with 5 day course of prednisone- 40 mg daily, tessalon and Biaxin 500 mg twice daily x 7 days.  I then received the following message today-  TA-Pt was seen by [facility] on 1.2.20 and Dx with Bronchitis/she was Tx with Clarithromycin 500mg  bid x7d; Prednisone 40mg  x5d; and Benzonatate 100mg  tid prn/She is still experiencing SOB and is asking if it would be appropriate for another round of Prednisone be done?  Also: She has been taking an old Rx of her daughters of Hycodan for her cough which has helped but would like to ask for an Rx for that as it is almost gone/plz advise on both Prednisone and Hycodan/thx dmf  I advised that I would call in Hycodan for severe cough (which I did) but she would need to be evaluated for further tx decisions since I have not seen her for these symptoms.  Pt is here to follow this up today.  Current Outpatient Medications on File Prior to Visit  Medication Sig Dispense Refill  . albuterol (PROVENTIL HFA;VENTOLIN HFA) 108 (90 Base) MCG/ACT inhaler INHALE 1-2 PUFFS INTO THE LUNGS EVERY 6 (SIX) HOURS AS NEEDED. 8.5 Inhaler 0  . ARIPiprazole (ABILIFY) 2 MG tablet Take 1 tablet (2 mg total) by mouth daily. (Patient not taking: Reported on 02/02/2018) 30 tablet 4  . benzonatate (TESSALON) 100 MG capsule Take 1  capsule (100 mg total) by mouth 3 (three) times daily as needed for cough. 30 capsule 0  . clarithromycin (BIAXIN) 500 MG tablet Take 1 tablet (500 mg total) by mouth 2 (two) times daily for 7 days. 14 tablet 0  . FLUoxetine (PROZAC) 20 MG capsule TAKE 2 CAPSULES (40 MG TOTAL) BY MOUTH AS NEEDED. 180 capsule 2  . guaiFENesin-codeine (CHERATUSSIN AC) 100-10 MG/5ML syrup Take 5 mLs by mouth 4 (four) times daily as needed for cough. 120 mL 0  . HYDROcodone-homatropine (HYCODAN) 5-1.5 MG/5ML syrup Take 5 mLs by mouth every 8 (eight) hours as needed for cough. 120 mL 0  . Multiple Vitamins-Minerals (MULTIVITAMIN PO) Take 2 tablets by mouth daily.    Marland Kitchen OVER THE COUNTER MEDICATION Take 3 capsules by mouth at bedtime. Lemon Balm Supplement    . progesterone (PROMETRIUM) 100 MG capsule Take 2 capsules (200 mg total) by mouth daily. 60 capsule 11  . thyroid (ARMOUR) 15 MG tablet Take 15 mg by mouth daily. Take 1qd with 60mg  to equal 75mg  total daily    . thyroid (ARMOUR) 60 MG tablet Take 60 mg by mouth daily before breakfast. Take 1qd with 15mg  to equal 75mg  total daily    . traZODone (DESYREL) 50 MG tablet Take 25 mg by mouth as needed.     No current facility-administered medications on file prior to visit.  No Known Allergies  Past Medical History:  Diagnosis Date  . Hypothyroidism   . Lyme disease   . PMS (premenstrual syndrome)     Past Surgical History:  Procedure Laterality Date  . AUGMENTATION MAMMAPLASTY Bilateral 1998  . BREAST BIOPSY Right 04/06/2017   affirm bx coil marker, path pending  . BREAST ENHANCEMENT SURGERY    . SHOULDER ARTHROSCOPY WITH LABRAL REPAIR Right 01/19/2017   Procedure: SHOULDER ARTHROSCOPY WITH LABRAL REPAIR, SLAP REPAIR;  Surgeon: Juanell Fairly, MD;  Location: [facility] ORS;  Service: Orthopedics;  Laterality: Right;    Family History  Problem Relation Age of Onset  . Cancer Mother 9       colon cancer  . Sleep apnea Brother   . Alcohol abuse Other     . Depression Other   . Cancer Father 57       mesothelioma  . Cancer Maternal Grandmother        ? ovarian  . Diabetes Maternal Grandfather   . Breast cancer Neg Hx     Social History   Socioeconomic History  . Marital status: Married    Spouse name: Not on file  . Number of children: 2  . Years of education: Not on file  . Highest education level: Not on file  Occupational History  . 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 Financial resource strain: Not on file  . Food insecurity:    Worry: Not on file    Inability: Not on file  . Transportation needs:    Medical: Not on file    Non-medical: Not on file  Tobacco Use  . Smoking status: Never Smoker  . Smokeless tobacco: Never Used  Substance and Sexual Activity  . Alcohol use: Yes    Alcohol/week: 0.0 standard drinks    Comment: OCC  . Drug use: No  . Sexual activity: Yes    Partners: Male    Birth control/protection: Rhythm  Lifestyle  . Physical activity:    Days per week: Not on file    Minutes per session: Not on file  . Stress: Not on file  Relationships  . Social connections:    Talks on phone: Not on file    Gets together: Not on file    Attends religious service: Not on file    Active member of club or organization: Not on file    Attends meetings of clubs or organizations: Not on file    Relationship status: Not on file  . Intimate partner violence:    Fear of current or ex partner: Not on file    Emotionally abused: Not on file    Physically abused: Not on file    Forced sexual activity: Not on file  Other Topics Concern  . Not on file  Social History Narrative   Gym 3-4 times a week.     Lives with husband in a two story home.  Has 2 children.    Teaches middle school, ESL.     Highest level of education:  Masters   The PMH, PSH, Social History, Family History, Medications, and allergies have been reviewed in [facility], and  have been updated if relevant.  Review of Systems     Objective:    There were no vitals taken for this visit.   Physical Exam        Assessment & Plan:   Bronchitis, acute, with bronchospasm No follow-ups  on file.

## 2018-02-08 NOTE — Telephone Encounter (Signed)
Pt aware/she is scheduled for 7:40am OV on 1.9.20/thx dmf

## 2018-02-09 ENCOUNTER — Ambulatory Visit: Payer: BC Managed Care – PPO | Admitting: Family Medicine

## 2018-02-13 ENCOUNTER — Ambulatory Visit: Payer: BC Managed Care – PPO | Admitting: Family Medicine

## 2018-02-13 ENCOUNTER — Ambulatory Visit: Payer: Self-pay | Admitting: *Deleted

## 2018-02-13 ENCOUNTER — Encounter: Payer: Self-pay | Admitting: Family Medicine

## 2018-02-13 ENCOUNTER — Ambulatory Visit (INDEPENDENT_AMBULATORY_CARE_PROVIDER_SITE_OTHER)
Admission: RE | Admit: 2018-02-13 | Discharge: 2018-02-13 | Disposition: A | Discharge status: Home / Self Care | Payer: BC Managed Care – PPO | Source: Ambulatory Visit | Attending: Family Medicine | Admitting: Family Medicine

## 2018-02-13 VITALS — BP 98/60 | HR 68 | Temp 98.4°F | Ht 62.0 in | Wt 140.5 lb

## 2018-02-13 DIAGNOSIS — R05 Cough: Secondary | ICD-10-CM

## 2018-02-13 DIAGNOSIS — J40 Bronchitis, not specified as acute or chronic: Secondary | ICD-10-CM | POA: Diagnosis not present

## 2018-02-13 DIAGNOSIS — J209 Acute bronchitis, unspecified: Secondary | ICD-10-CM

## 2018-02-13 DIAGNOSIS — R059 Cough, unspecified: Secondary | ICD-10-CM

## 2018-02-13 MED ORDER — PREDNISONE 20 MG PO TABS: 40.0000 mg | ORAL_TABLET | Freq: Every day | ORAL | 0 refills | Status: AC

## 2018-02-13 MED ORDER — ALBUTEROL SULFATE HFA 108 (90 BASE) MCG/ACT IN AERS: 1.0000 | INHALATION_SPRAY | Freq: Four times a day (QID) | RESPIRATORY_TRACT | 1 refills | Status: DC | PRN

## 2018-02-13 NOTE — Patient Instructions (Signed)
CXR today.  Use albuterol as needed.  If not better, then start prednisone with food.  If fever, discolored sputum, or feeling worse, then update the clinic.  Take care.  Glad to see you.

## 2018-02-13 NOTE — Telephone Encounter (Signed)
Per Denny Peon office mgr pt has appt with Dr Para March on 02/13/18 at 12:45 pm. FYI to Dr Para March.

## 2018-02-13 NOTE — Telephone Encounter (Signed)
Noted. Thanks.

## 2018-02-13 NOTE — Telephone Encounter (Signed)
  Patient is calling to report she is still having breathing difficulty. Patient reports after treatment for bronchitis- she still feels that she is not getting full breaths in which causes her to feel SOB. Patient was treated for Bronchitis with steriod and antibiotic and she does report her symptoms improved- but feels they did not fully resolve. Patient is requesting an appointment to be checked again for complications. 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 She is requesting an appointment at [facility]. Per FC- OK to schedule today. Strict instructions given to go to ED if gets worse before appointment. Reason for Disposition . [1] MILD difficulty breathing (e.g., minimal/no SOB at rest, SOB with walking, pulse <100) AND [2] NEW-onset or WORSE than normal    Recent treatment for Bronchitis- patient states symptoms did not resolve and she feels she is not breathing normally. Patient is working her teaching job.  Answer Assessment - Initial Assessment Questions 1. RESPIRATORY STATUS: "Describe your breathing?" (e.g., wheezing, shortness of breath, unable to speak, severe coughing)      SOB in chest- with breathing- does not feel like she is getting enough air 2. ONSET: "When did this breathing problem begin?"      02/02/18- started with sore throat- cough 3. PATTERN "Does the difficult breathing come and go, or has it been constant since it started?"      Steriod and antibiotic helped- but symptoms never quite went away 4. SEVERITY: "How bad is your breathing?" (e.g., mild, moderate, severe)    - MILD: No SOB at rest, mild SOB with walking, speaks normally in sentences, can lay down, no retractions, pulse < 100.    - MODERATE: SOB at rest, SOB with minimal exertion and prefers to sit, cannot lie down flat, speaks in phrases, mild retractions, audible wheezing, pulse 100-120.    - SEVERE: Very SOB at rest, speaks in single words, struggling to breathe, sitting hunched forward, retractions,  pulse > 120      Mild/moderate 5. RECURRENT SYMPTOM: "Have you had difficulty breathing before?" If so, ask: "When was the last time?" and "What happened that time?"      Yes- last 2 years she has had this- she has history of pneumonia 2 years ago 6. CARDIAC HISTORY: "Do you have any history of heart disease?" (e.g., heart attack, angina, bypass surgery, angioplasty)      no 7. LUNG HISTORY: "Do you have any history of lung disease?"  (e.g., pulmonary embolus, asthma, emphysema)     no 8. CAUSE: "What do you think is causing the breathing problem?"      Bronchitis doignosis 9. OTHER SYMPTOMS: "Do you have any other symptoms? (e.g., dizziness, runny nose, cough, chest pain, fever)     dizziness 10. PREGNANCY: "Is there any chance you are pregnant?" "When was your last menstrual period?"       n/a 11. TRAVEL: "Have you traveled out of the country in the last month?" (e.g., travel history, exposures)       no  Protocols used: BREATHING DIFFICULTY-A-AH

## 2018-02-13 NOTE — Progress Notes (Signed)
Call for follow-up.    She isn't lightheaded.  She isn't taking cheratussin and hydrocodone together, cautions d/w pt.    She had ST at [facility], then cough got worse over the holidays.  Progressive cough, was in bed for a few days.  She was wheezing on 02/02/17 OV.    Started on pred and biaxin at that point.    She got some better.  Had scant sputum prev, not now.  Still with cough.  No fevers.  No vomiting, no diarrhea.  She has some nausea.   She doesn't feel back to baseline.  Still fatigued.  She can still get a deep breath but feels the need to do so, even at rest.  She doesn't have audible wheeze.    She feels like her exertional capacity, walking distance is decrease in the last few days.    PMH and SH reviewed  ROS: Per HPI unless specifically indicated in ROS section   Meds, vitals, and allergies reviewed.   GEN: nad, alert and oriented HEENT: mucous membranes moist, TM wnl, nasal exam mildly stuffy, Op wnl NECK: supple w/o LA CV: rrr. PULM: ctab, no inc wob ABD: soft, +bs EXT: no edema SKIN: no acute rash

## 2018-02-15 NOTE — Assessment & Plan Note (Signed)
History of bronchitis.  She could have a postinfectious cough.  Lungs are clear at this point.  Still okay for outpatient follow-up.  Chest x-ray is unremarkable.  See notes on imaging. Use albuterol as needed.  If not better, then start prednisone with food.  If fever, discolored sputum, or feeling worse, then update the clinic.  She agrees.  Nontoxic.

## 2018-02-21 ENCOUNTER — Other Ambulatory Visit: Payer: Self-pay | Admitting: *Deleted

## 2018-02-21 MED ORDER — THYROID 60 MG PO TABS: 60.0000 mg | ORAL_TABLET | Freq: Every day | ORAL | 4 refills | Status: DC

## 2018-02-21 MED ORDER — THYROID 15 MG PO TABS: 15.0000 mg | ORAL_TABLET | Freq: Every day | ORAL | 4 refills | Status: DC

## 2018-04-05 ENCOUNTER — Other Ambulatory Visit: Payer: Self-pay | Admitting: *Deleted

## 2018-04-05 MED ORDER — TRAZODONE HCL 50 MG PO TABS: 25.0000 mg | ORAL_TABLET | ORAL | 2 refills | Status: DC | PRN

## 2018-08-10 ENCOUNTER — Telehealth: Payer: Self-pay | Admitting: *Deleted

## 2018-08-10 NOTE — Telephone Encounter (Signed)
Patient is calling to inquire if the office is doing COVID antibody testing. Patient was ill in January and feels she may have had the virus then. Is there any way that she can get tested for this?

## 2018-08-10 NOTE — Telephone Encounter (Signed)
Sent MyChart message with Dr.Aron's statement.

## 2018-08-10 NOTE — Telephone Encounter (Signed)
We can order them through [facility] but we don't recommend them as they are not yet very helpful in terms of immunity, infectivity, etc.

## 2018-08-13 ENCOUNTER — Other Ambulatory Visit: Payer: Self-pay | Admitting: Family Medicine

## 2018-09-12 ENCOUNTER — Other Ambulatory Visit: Payer: Self-pay

## 2018-09-12 DIAGNOSIS — Z20822 Contact with and (suspected) exposure to covid-19: Secondary | ICD-10-CM

## 2018-09-14 LAB — NOVEL CORONAVIRUS, NAA: SARS-CoV-2, NAA: NOT DETECTED

## 2018-09-28 ENCOUNTER — Other Ambulatory Visit: Payer: Self-pay

## 2018-09-28 DIAGNOSIS — Z20822 Contact with and (suspected) exposure to covid-19: Secondary | ICD-10-CM

## 2018-09-29 LAB — NOVEL CORONAVIRUS, NAA: SARS-CoV-2, NAA: NOT DETECTED

## 2018-10-11 ENCOUNTER — Other Ambulatory Visit: Payer: Self-pay | Admitting: Obstetrics and Gynecology

## 2018-10-19 ENCOUNTER — Ambulatory Visit (INDEPENDENT_AMBULATORY_CARE_PROVIDER_SITE_OTHER): Payer: BC Managed Care – PPO | Admitting: Obstetrics and Gynecology

## 2018-10-19 ENCOUNTER — Other Ambulatory Visit: Payer: Self-pay

## 2018-10-19 ENCOUNTER — Encounter: Payer: Self-pay | Admitting: Obstetrics and Gynecology

## 2018-10-19 VITALS — BP 110/73 | HR 61 | Ht 62.0 in | Wt 145.1 lb

## 2018-10-19 DIAGNOSIS — Z01419 Encounter for gynecological examination (general) (routine) without abnormal findings: Secondary | ICD-10-CM | POA: Diagnosis not present

## 2018-10-19 NOTE — Patient Instructions (Signed)
Calcium Pyruvate 1000mg  daily

## 2018-10-19 NOTE — Progress Notes (Signed)
Subjective:   Brittany Sanchez is a 48 y.o. G69P2002 Caucasian female here for a routine well-woman exam.  No LMP recorded. Patient has had an ablation.    Current complaints: no spotting since June, does feel like memory is worse.  PCP: Marjory Lies       does desire labs  Social History: Sexual: heterosexual Marital Status: married Living situation: with family Occupation: Pharmacist, hospital Tobacco/alcohol: no tobacco use Illicit drugs: no history of illicit drug use  The following portions of the patient's history were reviewed and updated as appropriate: allergies, current medications, past family history, past medical history, past social history, past surgical history and problem list.  Past Medical History Past Medical History:  Diagnosis Date  . Hypothyroidism   . Lyme disease   . PMS (premenstrual syndrome)     Past Surgical History Past Surgical History:  Procedure Laterality Date  . AUGMENTATION MAMMAPLASTY Bilateral 1998  . BREAST BIOPSY Right 04/06/2017   affirm bx coil marker, path pending  . BREAST ENHANCEMENT SURGERY    . SHOULDER ARTHROSCOPY WITH LABRAL REPAIR Right 01/19/2017   Procedure: SHOULDER ARTHROSCOPY WITH LABRAL REPAIR, SLAP REPAIR;  Surgeon: Thornton Park, MD;  Location: [facility] ORS;  Service: Orthopedics;  Laterality: Right;    Gynecologic History G2P2002  No LMP recorded. Patient has had an ablation. Contraception: tubal ligation Last Pap: 2018. Results were: normal Last mammogram: 04/2017. Results were: normal   Obstetric History OB History  Gravida Para Term Preterm AB Living  2 2 2     2   SAB TAB Ectopic Multiple Live Births          2    # Outcome Date GA Lbr Len/2nd Weight Sex Delivery Anes PTL Lv  2 Term      Vag-Spont   LIV  1 Term      Vag-Spont   LIV    Current Medications Current Outpatient Medications on File Prior to Visit  Medication Sig Dispense Refill  . albuterol (PROVENTIL HFA;VENTOLIN HFA) 108 (90 Base) MCG/ACT inhaler Inhale 1-2  puffs into the lungs every 6 (six) hours as needed. 8.5 Inhaler 1  . FLUoxetine (PROZAC) 20 MG capsule TAKE 2 CAPSULES (40 MG TOTAL) BY MOUTH AS NEEDED. 180 capsule 1  . Multiple Vitamins-Minerals (MULTIVITAMIN PO) Take 2 tablets by mouth daily.    Marland Kitchen OVER THE COUNTER MEDICATION Take 3 capsules by mouth at bedtime. Lemon Balm Supplement    . progesterone (PROMETRIUM) 100 MG capsule TAKE 2 CAPSULES (200 MG TOTAL) BY MOUTH DAILY. 180 capsule 3  . thyroid (ARMOUR) 15 MG tablet Take 1 tablet (15 mg total) by mouth daily. Take 1qd with 60mg  to equal 75mg  total daily 30 tablet 4  . thyroid (ARMOUR) 60 MG tablet Take 1 tablet (60 mg total) by mouth daily before breakfast. Take 1qd with 15mg  to equal 75mg  total daily 30 tablet 4  . traZODone (DESYREL) 50 MG tablet Take 0.5 tablets (25 mg total) by mouth as needed. 60 tablet 2   No current facility-administered medications on file prior to visit.     Review of Systems Patient denies any headaches, blurred vision, shortness of breath, chest pain, abdominal pain, problems with bowel movements, urination, or intercourse.  Objective:  BP 110/73   Pulse 61   Ht 5\' 2"  (1.575 m)   Wt 145 lb 1.6 oz (65.8 kg)   BMI 26.54 kg/m  Physical Exam  General:  Well developed, well nourished, no acute distress. She is alert and oriented x3.  Skin:  Warm and dry Neck:  Midline trachea, no thyromegaly or nodules Cardiovascular: Regular rate and rhythm, no murmur heard Lungs:  Effort normal, all lung fields clear to auscultation bilaterally Breasts:  No dominant palpable mass, retraction, or nipple discharge, bilateral implants noted without defect. Abdomen:  Soft, non tender, no hepatosplenomegaly or masses Pelvic:  External genitalia is normal in appearance.  The vagina is normal in appearance. The cervix is bulbous, no CMT.  Thin prep pap is not done . Uterus is felt to be normal size, shape, and contour.  No adnexal masses or tenderness noted.  Extremities:  No  swelling or varicosities noted Psych:  She has a normal mood and affect  Assessment:   Healthy well-woman exam  Plan:  TO add calcium pyruvate daily for memory. F/U 1 year for AE, or sooner if needed Mammogram due in March 2021  Melody Suzan NailerN Shambley, CNM

## 2018-10-20 LAB — COMPREHENSIVE METABOLIC PANEL
ALT: 18 IU/L (ref 0–32)
AST: 26 IU/L (ref 0–40)
Albumin/Globulin Ratio: 2.2 (ref 1.2–2.2)
Albumin: 4.2 g/dL (ref 3.8–4.8)
Alkaline Phosphatase: 46 IU/L (ref 39–117)
BUN/Creatinine Ratio: 20 (ref 9–23)
BUN: 21 mg/dL (ref 6–24)
Bilirubin Total: <0.2 mg/dL (ref 0.0–1.2)
CO2: 26 mmol/L (ref 20–29)
Calcium: 9.4 mg/dL (ref 8.7–10.2)
Chloride: 101 mmol/L (ref 96–106)
Creatinine, Ser: 1.04 mg/dL — ABNORMAL HIGH (ref 0.57–1.00)
GFR calc Af Amer: 73 mL/min/{1.73_m2} (ref >59)
GFR calc non Af Amer: 64 mL/min/{1.73_m2} (ref >59)
Globulin, Total: 1.9 g/dL (ref 1.5–4.5)
Glucose: 93 mg/dL (ref 65–99)
Potassium: 4 mmol/L (ref 3.5–5.2)
Sodium: 139 mmol/L (ref 134–144)
Total Protein: 6.1 g/dL (ref 6.0–8.5)

## 2018-10-20 LAB — CBC
Hematocrit: 38.6 % (ref 34.0–46.6)
Hemoglobin: 13.2 g/dL (ref 11.1–15.9)
MCH: 32 pg (ref 26.6–33.0)
MCHC: 34.2 g/dL (ref 31.5–35.7)
MCV: 94 fL (ref 79–97)
Platelets: 268 10*3/uL (ref 150–450)
RBC: 4.13 x10E6/uL (ref 3.77–5.28)
RDW: 12.3 % (ref 11.7–15.4)
WBC: 11 10*3/uL — ABNORMAL HIGH (ref 3.4–10.8)

## 2018-10-20 LAB — LIPID PANEL
Chol/HDL Ratio: 2.9 ratio (ref 0.0–4.4)
Cholesterol, Total: 152 mg/dL (ref 100–199)
HDL: 53 mg/dL (ref >39)
LDL Chol Calc (NIH): 74 mg/dL (ref 0–99)
Triglycerides: 144 mg/dL (ref 0–149)
VLDL Cholesterol Cal: 25 mg/dL (ref 5–40)

## 2018-10-20 LAB — THYROID PANEL WITH TSH
Free Thyroxine Index: 1.4 (ref 1.2–4.9)
T3 Uptake Ratio: 24 % (ref 24–39)
T4, Total: 5.7 ug/dL (ref 4.5–12.0)
TSH: 1.18 u[IU]/mL (ref 0.450–4.500)

## 2018-10-20 LAB — FSH/LH
FSH: 4.2 m[IU]/mL
LH: 4.1 m[IU]/mL

## 2018-10-20 LAB — HEMOGLOBIN A1C
Est. average glucose Bld gHb Est-mCnc: 111 mg/dL
Hgb A1c MFr Bld: 5.5 % (ref 4.8–5.6)

## 2018-11-29 ENCOUNTER — Other Ambulatory Visit: Payer: Self-pay | Admitting: *Deleted

## 2018-11-29 DIAGNOSIS — Z20822 Contact with and (suspected) exposure to covid-19: Secondary | ICD-10-CM

## 2018-11-30 LAB — NOVEL CORONAVIRUS, NAA: SARS-CoV-2, NAA: NOT DETECTED

## 2019-03-23 ENCOUNTER — Other Ambulatory Visit: Payer: Self-pay | Admitting: Orthopedic Surgery

## 2019-03-23 DIAGNOSIS — M25511 Pain in right shoulder: Secondary | ICD-10-CM

## 2019-03-28 ENCOUNTER — Ambulatory Visit: Payer: BC Managed Care – PPO

## 2019-04-12 ENCOUNTER — Ambulatory Visit
Admission: RE | Admit: 2019-04-12 | Discharge: 2019-04-12 | Disposition: A | Discharge status: Home / Self Care | Payer: BC Managed Care – PPO | Source: Ambulatory Visit | Attending: Orthopedic Surgery | Admitting: Orthopedic Surgery

## 2019-04-12 ENCOUNTER — Other Ambulatory Visit: Payer: Self-pay

## 2019-04-12 DIAGNOSIS — Y9323 Activity, snow (alpine) (downhill) skiing, snow boarding, sledding, tobogganing and snow tubing: Secondary | ICD-10-CM | POA: Insufficient documentation

## 2019-04-12 DIAGNOSIS — M75121 Complete rotator cuff tear or rupture of right shoulder, not specified as traumatic: Secondary | ICD-10-CM | POA: Diagnosis not present

## 2019-04-12 DIAGNOSIS — Y929 Unspecified place or not applicable: Secondary | ICD-10-CM | POA: Diagnosis not present

## 2019-04-12 DIAGNOSIS — M25511 Pain in right shoulder: Secondary | ICD-10-CM | POA: Insufficient documentation

## 2019-04-12 MED ORDER — GADOBUTROL 1 MMOL/ML IV SOLN
0.0100 mL | Freq: Once | INTRAVENOUS | Status: AC | PRN
  Administered 2019-04-12: 0.01 mL

## 2019-04-12 MED ORDER — LIDOCAINE HCL (PF) 1 % IJ SOLN
5.0000 mL | Freq: Once | INTRAMUSCULAR | Status: AC
  Administered 2019-04-12: 5 mL
  Filled 2019-04-12: qty 5

## 2019-04-12 MED ORDER — IOHEXOL 180 MG/ML  SOLN
7.0000 mL | Freq: Once | INTRAMUSCULAR | Status: AC | PRN
  Administered 2019-04-12: 7 mL via INTRA_ARTICULAR

## 2019-04-12 MED ORDER — SODIUM CHLORIDE (PF) 0.9 % IJ SOLN
13.0000 mL | Freq: Once | INTRAMUSCULAR | Status: AC
  Administered 2019-04-12: 10 mL

## 2019-04-20 ENCOUNTER — Other Ambulatory Visit: Payer: Self-pay | Admitting: Orthopedic Surgery

## 2019-04-23 ENCOUNTER — Other Ambulatory Visit: Payer: Self-pay

## 2019-04-23 ENCOUNTER — Encounter
Admission: RE | Admit: 2019-04-23 | Discharge: 2019-04-23 | Disposition: A | Discharge status: Home / Self Care | Payer: BC Managed Care – PPO | Source: Ambulatory Visit | Attending: Orthopedic Surgery | Admitting: Orthopedic Surgery

## 2019-04-23 ENCOUNTER — Other Ambulatory Visit (INDEPENDENT_AMBULATORY_CARE_PROVIDER_SITE_OTHER): Payer: Self-pay | Admitting: Nurse Practitioner

## 2019-04-23 DIAGNOSIS — Z01818 Encounter for other preprocedural examination: Secondary | ICD-10-CM | POA: Diagnosis not present

## 2019-04-23 DIAGNOSIS — I8392 Asymptomatic varicose veins of left lower extremity: Secondary | ICD-10-CM

## 2019-04-23 NOTE — Patient Instructions (Signed)
INSTRUCTIONS FOR SURGERY     Your surgery is scheduled for:   Thursday, April 1ST     To find out your arrival time for the day of surgery,          please call [phone] between 1 pm and 3 pm on :  Wednesday, MARCH 31ST     When you arrive for surgery, report to the [facility].       Do NOT stop on the first floor to register.    REMEMBER: Instructions that are not followed completely may result in serious medical risk,  up to and including death, or upon the discretion of your surgeon and anesthesiologist,            your surgery may need to be rescheduled.  __X__ 1. Do not eat food after midnight the night before your procedure.                    No gum, candy, lozenger, tic tacs, tums or hard candies.                  ABSOLUTELY NOTHING SOLID IN YOUR MOUTH AFTER MIDNIGHT                    You may drink unlimited clear liquids up to 2 hours before you are scheduled to arrive for surgery.                   Do not drink anything within those 2 hours unless you need to take medicine, then take the                   smallest amount you need.  Clear liquids include:  water, apple juice without pulp,                   any flavor Gatorade, Black coffee, black tea.  Sugar may be added but no dairy/ honey /lemon.                        Broth and jello is not considered a clear liquid.  __x__  2. On the morning of surgery, please brush your teeth with toothpaste and water. You may rinse with                  mouthwash if you wish but DO NOT SWALLOW TOOTHPASTE OR MOUTHWASH  __X___3. NO alcohol for 24 hours before or after surgery.  __x___ 4.  Do NOT smoke or use e-cigarettes for 24 HOURS PRIOR TO SURGERY.                      DO NOT Use any chewable tobacco products for at least 6 hours prior to surgery.  __x___ 5. If you start any new medication after this appointment and prior to surgery, please       Bring it with you on the day of surgery.  ___x__ 6. Notify your doctor if there is any change in your medical condition, such as fever,  infection, vomitting, diarrhea or any open sores.  __x___ 7.  USE the CHG SOAP as instructed, the night before surgery and the day of surgery.                   Once you have washed with this soap, do NOT use any of the following: Powders, perfumes                    or lotions. Please do not wear make up, hairpins, clips or nail polish. You MAY wear deodorant.                   Men may shave their face and neck.  Women need to shave 48 hours prior to surgery.                   DO NOT wear ANY jewelry on the day of surgery. If there are rings that are too tight to                    remove easily, please address this prior to the surgery day. Piercings need to be removed.                                                                     NO METAL ON YOUR BODY.                    Do NOT bring any valuables.  If you came to Pre-Admit testing then you will not need license,                     insurance card or credit card.  If you will be staying overnight, please either leave your things in                     the car or have your family be responsible for these items.                     [facility] IS NOT RESPONSIBLE FOR BELONGINGS OR VALUABLES.  ___X__ 8. DO NOT wear contact lenses on surgery day.  You may not have dentures,                     Hearing aides, contacts or glasses in the operating room. These items can be                    Placed in the Recovery Room to receive immediately after surgery.  __x___ 9. IF YOU ARE SCHEDULED TO GO HOME ON THE SAME DAY, YOU MUST                   Have someone to drive you home and to stay with you  for the first 24 hours.                    Have an arrangement prior to arriving on surgery day.  ___x__ 10. Take the following medications on the morning of surgery with a sip of water:  1. THYROID ARMOUR                     2. OXYCODONE, if needed                     3.                                                   _____ 11.  Follow any instructions provided to you by your surgeon.                        Such as enema, clear liquid bowel prep  __X__  12. STOP ALL ASPIRIN PRODUCTS  AS OF: MARCH 25TH                            (ONE WEEK PRIOR TO SURGERY)                       THIS INCLUDES BC POWDERS / GOODIES POWDER  __x___ 13. STOP Anti-inflammatories as of: MARCH 25TH                         (ONE WEEK PRIOR TO SURGERY)                      This includes IBUPROFEN / MOTRIN / ADVIL / ALEVE/ NAPROXYN /                          MOBIC (MELOXICAM)                    YOU MAY TAKE TYLENOL ANY TIME PRIOR TO SURGERY.  __X___ 14.  Stop supplements until after surgery.                     This includes: MULTIVITAMINS                 You may continue taking Vitamin B12 / Vitamin D3 but do not take on the morning of surgery.  ______18  Wear clean and comfortable clothing to the hospital.                    Bring an oversized shirt or a button up shirt or pajamas to get dressed into after surgery.  Please have stool softeners on hand to use after surgery. If you remember, please bring Medical Advance Directives with you so we can make a copy     For your chart.

## 2019-04-25 ENCOUNTER — Other Ambulatory Visit: Payer: Self-pay

## 2019-04-25 ENCOUNTER — Encounter (INDEPENDENT_AMBULATORY_CARE_PROVIDER_SITE_OTHER): Payer: Self-pay | Admitting: Nurse Practitioner

## 2019-04-25 ENCOUNTER — Ambulatory Visit (INDEPENDENT_AMBULATORY_CARE_PROVIDER_SITE_OTHER): Payer: BC Managed Care – PPO | Admitting: Nurse Practitioner

## 2019-04-25 ENCOUNTER — Ambulatory Visit (INDEPENDENT_AMBULATORY_CARE_PROVIDER_SITE_OTHER): Payer: BC Managed Care – PPO

## 2019-04-25 VITALS — BP 102/65 | HR 60 | Resp 16 | Ht 62.0 in | Wt 143.8 lb

## 2019-04-25 DIAGNOSIS — I8392 Asymptomatic varicose veins of left lower extremity: Secondary | ICD-10-CM | POA: Diagnosis not present

## 2019-04-25 DIAGNOSIS — M542 Cervicalgia: Secondary | ICD-10-CM | POA: Insufficient documentation

## 2019-04-25 DIAGNOSIS — I83812 Varicose veins of left lower extremities with pain: Secondary | ICD-10-CM | POA: Diagnosis not present

## 2019-04-25 DIAGNOSIS — S43006A Unspecified dislocation of unspecified shoulder joint, initial encounter: Secondary | ICD-10-CM | POA: Insufficient documentation

## 2019-04-25 NOTE — Progress Notes (Signed)
SUBJECTIVE:  Patient ID: Brittany Sanchez, female    DOB: [birth year], 49 y.o.   MRN: [id number] Chief Complaint  Patient presents with  . New Patient (Initial Visit)    ref Nehemiah Massed varicose veins with pain    HPI  Brittany Sanchez is a 49 y.o. female The patient is seen for evaluation of symptomatic varicose veins. The patient relates burning and stinging which worsened steadily throughout the course of the day, particularly with standing. The patient also notes an aching and throbbing pain over the varicosities, particularly with prolonged dependent positions. The symptoms are significantly improved with elevation.  The patient also notes that during hot weather the symptoms are greatly intensified. The patient states the pain from the varicose veins interferes with work, daily exercise, shopping and household maintenance. At this point, the symptoms are persistent and severe enough that they're having a negative impact on lifestyle and are interfering with daily activities.  There is no history of DVT, PE or superficial thrombophlebitis. There is no history of ulceration or hemorrhage. The patient denies a significant family history of varicose veins. OB history: [id number]  The patient has worn graduated compression in the past. At the present time the patient has been using over-the-counter analgesics. There is no history of prior surgical intervention or sclerotherapy.  Noninvasive studies today reveal evidence of venous reflux in the popliteal vein of the left lower extremity.  There is also reflux in the great saphenous vein at the saphenofemoral junction extending to the mid thigh.  There is also reflux in the great saphenous vein at the knee.  The patient's vein diameters range from 0.42 cm to 0.54.  Past Medical History:  Diagnosis Date  . Hypothyroidism   . Lyme disease   . PMS (premenstrual syndrome)     Past Surgical History:  Procedure Laterality Date  . AUGMENTATION  MAMMAPLASTY Bilateral 1998  . BREAST BIOPSY Right 04/06/2017   affirm bx coil marker, path pending  . BREAST ENHANCEMENT SURGERY    . SHOULDER ARTHROSCOPY WITH LABRAL REPAIR Right 01/19/2017   Procedure: SHOULDER ARTHROSCOPY WITH LABRAL REPAIR, SLAP REPAIR;  Surgeon: Thornton Park, MD;  Location: [facility] ORS;  Service: Orthopedics;  Laterality: Right;    Social History   Socioeconomic History  . Marital status: Married    Spouse name: stephen  . Number of children: 2  . Years of education: Not on file  . Highest education level: Not on file  Occupational History  . Occupation: teaches high school    Employer: [employer]  Tobacco Use  . Smoking status: Never Smoker  . Smokeless tobacco: Never Used  Substance and Sexual Activity  . Alcohol use: Yes    Alcohol/week: 0.0 standard drinks    Comment: OCC  . Drug use: No  . Sexual activity: Yes    Partners: Male    Birth control/protection: Rhythm  Other Topics Concern  . Not on file  Social History Narrative   Gym 3-4 times a week.     Lives with husband in a two story home.  Lives with one daughter and her brother   Teaches high school, ESL.     Highest level of education:  Masters   Scientist, physiological Strain:   . Difficulty of Paying Living Expenses:   Food Insecurity:   . Worried About Charity fundraiser in the Last Year:   . Arboriculturist in the Last Year:   [employer]  Needs:   . Lack of Transportation (Medical):   Marland Kitchen Lack of Transportation (Non-Medical):   Physical Activity:   . Days of Exercise per Week:   . Minutes of Exercise per Session:   Stress:   . Feeling of Stress :   Social Connections:   . Frequency of Communication with Friends and Family:   . Frequency of Social Gatherings with Friends and Family:   . Attends Religious Services:   . Active Member of Clubs or Organizations:   . Attends Banker Meetings:   Marland Kitchen Marital Status:     Intimate Partner Violence:   . Fear of Current or Ex-Partner:   . Emotionally Abused:   Marland Kitchen Physically Abused:   . Sexually Abused:     Family History  Problem Relation Age of Onset  . Cancer Mother 68       colon cancer  . Sleep apnea Brother   . Alcohol abuse Other   . Depression Other   . Cancer Father 51       mesothelioma  . Cancer Maternal Grandmother        ? ovarian  . Diabetes Maternal Grandfather   . Breast cancer Neg Hx     Allergies  Allergen Reactions  . Seasonal Ic [Cholestatin] Other (See Comments)    NASAL SYMPTOMS, RUNNY NOSE     Review of Systems   Review of Systems: Negative Unless Checked Constitutional: [] Weight loss  [] Fever  [] Chills Cardiac: [] Chest pain   []  Atrial Fibrillation  [] Palpitations   [] Shortness of breath when laying flat   [] Shortness of breath with exertion. [] Shortness of breath at rest Vascular:  [] Pain in legs with walking   [] Pain in legs with standing [] Pain in legs when laying flat   [] Claudication    [] Pain in feet when laying flat    [] History of DVT   [] Phlebitis   [x] Swelling in legs   [x] Varicose veins   [] Non-healing ulcers Pulmonary:   [] Uses home oxygen   [] Productive cough   [] Hemoptysis   [] Wheeze  [] COPD   [] Asthma Neurologic:  [] Dizziness   [] Seizures  [] Blackouts [] History of stroke   [] History of TIA  [] Aphasia   [] Temporary Blindness   [] Weakness or numbness in arm   [] Weakness or numbness in leg Musculoskeletal:   [] Joint swelling   [] Joint pain   [] Low back pain  []  History of Knee Replacement [] Arthritis [] back Surgeries  []  Spinal Stenosis    Hematologic:  [] Easy bruising  [] Easy bleeding   [] Hypercoagulable state   [] Anemic Gastrointestinal:  [] Diarrhea   [] Vomiting  [] Gastroesophageal reflux/heartburn   [] Difficulty swallowing. [] Abdominal pain Genitourinary:  [] Chronic kidney disease   [] Difficult urination  [] Anuric   [] Blood in urine [] Frequent urination  [] Burning with urination   [] Hematuria Skin:   [] Rashes   [] Ulcers [] Wounds Psychological:  [x] History of anxiety   []  History of major depression  []  Memory Difficulties      OBJECTIVE:   Physical Exam  BP 102/65 (BP Location: Right Arm)   Pulse 60   Resp 16   Ht 5\' 2"  (1.575 m)   Wt 143 lb 12.8 oz (65.2 kg)   BMI 26.30 kg/m   Gen: WD/WN, NAD Head: [place]/AT, No temporalis wasting.  Ear/Nose/Throat: Hearing grossly intact, nares w/o erythema or drainage Eyes: PER, EOMI, sclera nonicteric.  Neck: Supple, no masses.  No JVD.  Pulmonary:  Good air movement, no use of accessory muscles.  Cardiac: RRR Vascular: Large varicosity on posterior  left leg  Vessel Right Left  Radial Palpable Palpable  Dorsalis Pedis Palpable Palpable  Posterior Tibial Palpable Palpable   Gastrointestinal: soft, non-distended. No guarding/no peritoneal signs.  Musculoskeletal: M/S 5/5 throughout.  No deformity or atrophy.  Neurologic: Pain and light touch intact in extremities.  Symmetrical.  Speech is fluent. Motor exam as listed above. Psychiatric: Judgment intact, Mood & affect appropriate for pt's clinical situation. Dermatologic: No Venous rashes. No Ulcers Noted.  No changes consistent with cellulitis. Lymph : No Cervical lymphadenopathy, no lichenification or skin changes of chronic lymphedema.       ASSESSMENT AND PLAN:  1. Varicose veins of left lower extremity with pain Recommend  I have reviewed my previous  discussion with the patient regarding  varicose veins and why they cause symptoms. Patient will continue  wearing graduated compression stockings class 1 on a daily basis, beginning first thing in the morning and removing them in the evening.    In addition, behavioral modification including elevation during the day was again discussed and this will continue.  The patient has utilized over the counter pain medications and has been exercising.  However, at this time conservative therapy has not alleviated the patient's symptoms of  leg pain and swelling  Recommend: laser ablation of the left great saphenous veins to eliminate the symptoms of pain and swelling of the lower extremities caused by the severe superficial venous reflux disease.    Current Outpatient Medications on File Prior to Visit  Medication Sig Dispense Refill  . FLUoxetine (PROZAC) 40 MG capsule Take 40 mg by mouth at bedtime.    . meloxicam (MOBIC) 15 MG tablet Take 15 mg by mouth daily as needed for pain.    . Multiple Vitamin (MULTIVITAMIN WITH MINERALS) TABS tablet Take 1 tablet by mouth daily.    . naltrexone (DEPADE) 50 MG tablet Take 25 mg by mouth daily. For neuropathies. Takes at night    . oxyCODONE (OXY IR/ROXICODONE) 5 MG immediate release tablet Take 5 mg by mouth every 4 (four) hours as needed for pain.    . progesterone (PROMETRIUM) 100 MG capsule TAKE 2 CAPSULES (200 MG TOTAL) BY MOUTH DAILY. (Patient taking differently: Take 200 mg by mouth at bedtime. ) 180 capsule 3  . thyroid (ARMOUR) 15 MG tablet Take 1 tablet (15 mg total) by mouth daily. Take 1qd with 60mg  to equal 75mg  total daily 30 tablet 4  . thyroid (ARMOUR) 60 MG tablet Take 1 tablet (60 mg total) by mouth daily before breakfast. Take 1qd with 15mg  to equal 75mg  total daily 30 tablet 4  . traZODone (DESYREL) 50 MG tablet Take 0.5 tablets (25 mg total) by mouth as needed. (Patient taking differently: Take 50 mg by mouth at bedtime. ) 60 tablet 2   No current facility-administered medications on file prior to visit.    There are no Patient Instructions on file for this visit. No follow-ups on file.   , NP  This note was completed with .  Any errors are purely unintentional.

## 2019-05-01 ENCOUNTER — Other Ambulatory Visit: Payer: Self-pay

## 2019-05-01 ENCOUNTER — Other Ambulatory Visit
Admission: RE | Admit: 2019-05-01 | Discharge: 2019-05-01 | Disposition: A | Discharge status: Home / Self Care | Payer: BC Managed Care – PPO | Source: Ambulatory Visit | Attending: Orthopedic Surgery | Admitting: Orthopedic Surgery

## 2019-05-01 DIAGNOSIS — Z01812 Encounter for preprocedural laboratory examination: Secondary | ICD-10-CM | POA: Insufficient documentation

## 2019-05-01 DIAGNOSIS — Z20822 Contact with and (suspected) exposure to covid-19: Secondary | ICD-10-CM | POA: Diagnosis not present

## 2019-05-01 LAB — CBC WITH DIFFERENTIAL/PLATELET
Abs Immature Granulocytes: 0.03 10*3/uL (ref 0.00–0.07)
Basophils Absolute: 0 10*3/uL (ref 0.0–0.1)
Basophils Relative: 1 %
Eosinophils Absolute: 0 10*3/uL (ref 0.0–0.5)
Eosinophils Relative: 0 %
HCT: 41.7 % (ref 36.0–46.0)
Hemoglobin: 14 g/dL (ref 12.0–15.0)
Immature Granulocytes: 0 %
Lymphocytes Relative: 25 %
Lymphs Abs: 2 10*3/uL (ref 0.7–4.0)
MCH: 30.4 pg (ref 26.0–34.0)
MCHC: 33.6 g/dL (ref 30.0–36.0)
MCV: 90.5 fL (ref 80.0–100.0)
Monocytes Absolute: 0.5 10*3/uL (ref 0.1–1.0)
Monocytes Relative: 6 %
Neutro Abs: 5.6 10*3/uL (ref 1.7–7.7)
Neutrophils Relative %: 68 %
Platelets: 239 10*3/uL (ref 150–400)
RBC: 4.61 MIL/uL (ref 3.87–5.11)
RDW: 11.9 % (ref 11.5–15.5)
WBC: 8.3 10*3/uL (ref 4.0–10.5)
nRBC: 0 % (ref 0.0–0.2)

## 2019-05-01 LAB — SARS CORONAVIRUS 2 (TAT 6-24 HRS): SARS Coronavirus 2: NEGATIVE

## 2019-05-01 LAB — BASIC METABOLIC PANEL
Anion gap: 10 (ref 5–15)
BUN: 21 mg/dL — ABNORMAL HIGH (ref 6–20)
CO2: 26 mmol/L (ref 22–32)
Calcium: 9.6 mg/dL (ref 8.9–10.3)
Chloride: 101 mmol/L (ref 98–111)
Creatinine, Ser: 1.07 mg/dL — ABNORMAL HIGH (ref 0.44–1.00)
GFR calc Af Amer: >60 mL/min (ref >60)
GFR calc non Af Amer: >60 mL/min (ref >60)
Glucose, Bld: 87 mg/dL (ref 70–99)
Potassium: 3.8 mmol/L (ref 3.5–5.1)
Sodium: 137 mmol/L (ref 135–145)

## 2019-05-01 LAB — PROTIME-INR
INR: 1 (ref 0.8–1.2)
Prothrombin Time: 12.7 seconds (ref 11.4–15.2)

## 2019-05-01 LAB — APTT: aPTT: 31 seconds (ref 24–36)

## 2019-05-03 ENCOUNTER — Other Ambulatory Visit: Payer: Self-pay

## 2019-05-03 ENCOUNTER — Ambulatory Visit: Payer: BC Managed Care – PPO | Admitting: Registered Nurse

## 2019-05-03 ENCOUNTER — Encounter: Payer: Self-pay | Admitting: Orthopedic Surgery

## 2019-05-03 ENCOUNTER — Encounter
Admission: RE | Disposition: A | Discharge status: Home / Self Care | Payer: Self-pay | Source: Home / Self Care | Attending: Orthopedic Surgery

## 2019-05-03 ENCOUNTER — Ambulatory Visit: Payer: BC Managed Care – PPO

## 2019-05-03 ENCOUNTER — Ambulatory Visit
Admission: RE | Admit: 2019-05-03 | Discharge: 2019-05-03 | Disposition: A | Discharge status: Home / Self Care | Payer: BC Managed Care – PPO | Attending: Orthopedic Surgery | Admitting: Orthopedic Surgery

## 2019-05-03 DIAGNOSIS — Z419 Encounter for procedure for purposes other than remedying health state, unspecified: Secondary | ICD-10-CM

## 2019-05-03 DIAGNOSIS — Z79890 Hormone replacement therapy (postmenopausal): Secondary | ICD-10-CM | POA: Insufficient documentation

## 2019-05-03 DIAGNOSIS — Z79899 Other long term (current) drug therapy: Secondary | ICD-10-CM | POA: Diagnosis not present

## 2019-05-03 DIAGNOSIS — S43004A Unspecified dislocation of right shoulder joint, initial encounter: Secondary | ICD-10-CM | POA: Insufficient documentation

## 2019-05-03 DIAGNOSIS — M7501 Adhesive capsulitis of right shoulder: Secondary | ICD-10-CM | POA: Insufficient documentation

## 2019-05-03 DIAGNOSIS — Z791 Long term (current) use of non-steroidal anti-inflammatories (NSAID): Secondary | ICD-10-CM | POA: Insufficient documentation

## 2019-05-03 DIAGNOSIS — F329 Major depressive disorder, single episode, unspecified: Secondary | ICD-10-CM | POA: Diagnosis not present

## 2019-05-03 DIAGNOSIS — E039 Hypothyroidism, unspecified: Secondary | ICD-10-CM | POA: Diagnosis not present

## 2019-05-03 DIAGNOSIS — F419 Anxiety disorder, unspecified: Secondary | ICD-10-CM | POA: Diagnosis not present

## 2019-05-03 DIAGNOSIS — G629 Polyneuropathy, unspecified: Secondary | ICD-10-CM | POA: Diagnosis not present

## 2019-05-03 HISTORY — PX: SHOULDER ARTHROSCOPY WITH LABRAL REPAIR: SHX5691

## 2019-05-03 LAB — POCT PREGNANCY, URINE: Preg Test, Ur: NEGATIVE

## 2019-05-03 SURGERY — ARTHROSCOPY, SHOULDER, WITH GLENOID LABRUM REPAIR
Anesthesia: General | Site: Shoulder | Laterality: Right

## 2019-05-03 MED ORDER — SUGAMMADEX SODIUM 200 MG/2ML IV SOLN
INTRAVENOUS | Status: DC | PRN
  Administered 2019-05-03: 125 mg via INTRAVENOUS

## 2019-05-03 MED ORDER — PHENYLEPHRINE HCL-NACL 20-0.9 MG/250ML-% IV SOLN
INTRAVENOUS | Status: DC | PRN
  Administered 2019-05-03: 40 ug/min via INTRAVENOUS

## 2019-05-03 MED ORDER — LIDOCAINE HCL (CARDIAC) PF 100 MG/5ML IV SOSY
PREFILLED_SYRINGE | INTRAVENOUS | Status: DC | PRN
  Administered 2019-05-03: 60 mg via INTRAVENOUS

## 2019-05-03 MED ORDER — PROPOFOL 10 MG/ML IV BOLUS
INTRAVENOUS | Status: DC | PRN
  Administered 2019-05-03: 130 mg via INTRAVENOUS

## 2019-05-03 MED ORDER — CHLORHEXIDINE GLUCONATE CLOTH 2 % EX PADS: 6.0000 | MEDICATED_PAD | Freq: Once | CUTANEOUS | Status: DC

## 2019-05-03 MED ORDER — ACETAMINOPHEN 10 MG/ML IV SOLN
INTRAVENOUS | Status: AC
  Filled 2019-05-03: qty 100

## 2019-05-03 MED ORDER — DEXAMETHASONE SODIUM PHOSPHATE 10 MG/ML IJ SOLN
INTRAMUSCULAR | Status: DC | PRN
  Administered 2019-05-03: 5 mg via INTRAVENOUS

## 2019-05-03 MED ORDER — ONDANSETRON HCL 4 MG/2ML IJ SOLN
INTRAMUSCULAR | Status: DC | PRN
  Administered 2019-05-03: 4 mg via INTRAVENOUS

## 2019-05-03 MED ORDER — PROMETHAZINE HCL 25 MG/ML IJ SOLN: 6.2500 mg | INTRAMUSCULAR | Status: DC | PRN

## 2019-05-03 MED ORDER — ONDANSETRON HCL 4 MG PO TABS: 4.0000 mg | ORAL_TABLET | Freq: Three times a day (TID) | ORAL | 0 refills | Status: DC | PRN

## 2019-05-03 MED ORDER — FAMOTIDINE 20 MG PO TABS
ORAL_TABLET | ORAL | Status: AC
  Administered 2019-05-03: 20 mg via ORAL
  Filled 2019-05-03: qty 1

## 2019-05-03 MED ORDER — EPHEDRINE 5 MG/ML INJ
INTRAVENOUS | Status: AC
  Filled 2019-05-03: qty 10

## 2019-05-03 MED ORDER — FENTANYL CITRATE (PF) 100 MCG/2ML IJ SOLN: 50.0000 ug | INTRAMUSCULAR | Status: DC | PRN

## 2019-05-03 MED ORDER — PROPOFOL 10 MG/ML IV BOLUS
INTRAVENOUS | Status: AC
  Filled 2019-05-03: qty 20

## 2019-05-03 MED ORDER — FENTANYL CITRATE (PF) 100 MCG/2ML IJ SOLN: 25.0000 ug | INTRAMUSCULAR | Status: DC | PRN

## 2019-05-03 MED ORDER — MIDAZOLAM HCL 2 MG/2ML IJ SOLN
INTRAMUSCULAR | Status: AC
  Filled 2019-05-03: qty 2

## 2019-05-03 MED ORDER — ROCURONIUM BROMIDE 100 MG/10ML IV SOLN
INTRAVENOUS | Status: DC | PRN
  Administered 2019-05-03: 50 mg via INTRAVENOUS

## 2019-05-03 MED ORDER — PHENYLEPHRINE HCL (PRESSORS) 10 MG/ML IV SOLN
INTRAVENOUS | Status: DC | PRN
  Administered 2019-05-03: 100 ug via INTRAVENOUS

## 2019-05-03 MED ORDER — MIDAZOLAM HCL 2 MG/2ML IJ SOLN
INTRAMUSCULAR | Status: DC | PRN
Start: 2019-05-03 — End: 2019-05-03
  Administered 2019-05-03: 2 mg via INTRAVENOUS

## 2019-05-03 MED ORDER — SEVOFLURANE IN SOLN
RESPIRATORY_TRACT | Status: AC
  Filled 2019-05-03: qty 250

## 2019-05-03 MED ORDER — DEXAMETHASONE SODIUM PHOSPHATE 10 MG/ML IJ SOLN
INTRAMUSCULAR | Status: AC
  Filled 2019-05-03: qty 1

## 2019-05-03 MED ORDER — PHENYLEPHRINE HCL (PRESSORS) 10 MG/ML IV SOLN
INTRAVENOUS | Status: AC
  Filled 2019-05-03: qty 1

## 2019-05-03 MED ORDER — OXYCODONE HCL 5 MG PO TABS: 5.0000 mg | ORAL_TABLET | Freq: Once | ORAL | Status: DC | PRN

## 2019-05-03 MED ORDER — EPHEDRINE SULFATE 50 MG/ML IJ SOLN
INTRAMUSCULAR | Status: DC | PRN
  Administered 2019-05-03 (×4): 10 mg via INTRAVENOUS

## 2019-05-03 MED ORDER — BUPIVACAINE HCL (PF) 0.5 % IJ SOLN
INTRAMUSCULAR | Status: AC
  Filled 2019-05-03: qty 10

## 2019-05-03 MED ORDER — ONDANSETRON HCL 4 MG/2ML IJ SOLN
INTRAMUSCULAR | Status: AC
  Filled 2019-05-03: qty 2

## 2019-05-03 MED ORDER — LACTATED RINGERS IV SOLN
INTRAVENOUS | Status: DC | PRN
  Administered 2019-05-03: 12000 mL

## 2019-05-03 MED ORDER — CEFAZOLIN SODIUM-DEXTROSE 2-4 GM/100ML-% IV SOLN
2.0000 g | INTRAVENOUS | Status: AC
  Administered 2019-05-03: 2 g via INTRAVENOUS

## 2019-05-03 MED ORDER — FENTANYL CITRATE (PF) 100 MCG/2ML IJ SOLN
INTRAMUSCULAR | Status: AC
  Filled 2019-05-03: qty 2

## 2019-05-03 MED ORDER — BUPIVACAINE LIPOSOME 1.3 % IJ SUSP
INTRAMUSCULAR | Status: AC
  Filled 2019-05-03: qty 20

## 2019-05-03 MED ORDER — MIDAZOLAM HCL 2 MG/2ML IJ SOLN
INTRAMUSCULAR | Status: AC
  Administered 2019-05-03: 1 mg via INTRAVENOUS
  Filled 2019-05-03: qty 2

## 2019-05-03 MED ORDER — MIDAZOLAM HCL 2 MG/2ML IJ SOLN: 1.0000 mg | INTRAMUSCULAR | Status: DC | PRN

## 2019-05-03 MED ORDER — OXYCODONE HCL 5 MG PO TABS: 5.0000 mg | ORAL_TABLET | ORAL | 0 refills | Status: DC | PRN

## 2019-05-03 MED ORDER — ROCURONIUM BROMIDE 10 MG/ML (PF) SYRINGE
PREFILLED_SYRINGE | INTRAVENOUS | Status: AC
  Filled 2019-05-03: qty 10

## 2019-05-03 MED ORDER — OXYCODONE HCL 5 MG/5ML PO SOLN: 5.0000 mg | Freq: Once | ORAL | Status: DC | PRN

## 2019-05-03 MED ORDER — LIDOCAINE HCL (PF) 2 % IJ SOLN
INTRAMUSCULAR | Status: AC
  Filled 2019-05-03: qty 10

## 2019-05-03 MED ORDER — LACTATED RINGERS IR SOLN
Status: DC | PRN
  Administered 2019-05-03: 6000 mL
  Administered 2019-05-03: 3000 mL
  Administered 2019-05-03 (×2): 6000 mL
  Administered 2019-05-03: 9000 mL
  Administered 2019-05-03: 6000 mL
  Administered 2019-05-03: 12000 mL

## 2019-05-03 MED ORDER — FENTANYL CITRATE (PF) 100 MCG/2ML IJ SOLN
INTRAMUSCULAR | Status: DC | PRN
  Administered 2019-05-03 (×2): 50 ug via INTRAVENOUS

## 2019-05-03 MED ORDER — FAMOTIDINE 20 MG PO TABS: 20.0000 mg | ORAL_TABLET | Freq: Once | ORAL | Status: AC

## 2019-05-03 MED ORDER — LACTATED RINGERS IV SOLN: INTRAVENOUS | Status: DC

## 2019-05-03 MED ORDER — FENTANYL CITRATE (PF) 100 MCG/2ML IJ SOLN
INTRAMUSCULAR | Status: AC
  Administered 2019-05-03: 50 ug via INTRAVENOUS
  Filled 2019-05-03: qty 2

## 2019-05-03 MED ORDER — EPINEPHRINE PF 1 MG/ML IJ SOLN
INTRAMUSCULAR | Status: AC
  Filled 2019-05-03: qty 4

## 2019-05-03 MED ORDER — CEFAZOLIN SODIUM-DEXTROSE 2-4 GM/100ML-% IV SOLN
INTRAVENOUS | Status: AC
  Filled 2019-05-03: qty 100

## 2019-05-03 MED ORDER — ACETAMINOPHEN 10 MG/ML IV SOLN
INTRAVENOUS | Status: DC | PRN
  Administered 2019-05-03: 1000 mg via INTRAVENOUS

## 2019-05-03 MED ORDER — LIDOCAINE HCL (PF) 1 % IJ SOLN
INTRAMUSCULAR | Status: AC
  Filled 2019-05-03: qty 5

## 2019-05-03 SURGICAL SUPPLY — 63 items
ADAPTER IRRIG TUBE 2 SPIKE SOL (ADAPTER) ×6 IMPLANT
ANCHOR SUT BIOC ST 3X145 (Anchor) ×11 IMPLANT
BUR RADIUS 4.0X18.5 (BURR) ×3 IMPLANT
BUR RADIUS 5.5 (BURR) ×3 IMPLANT
CANISTER SUCT LVC 12 LTR MEDI- (MISCELLANEOUS) ×2 IMPLANT
CANNULA 5.75X7 CRYSTAL CLEAR (CANNULA) ×6 IMPLANT
CANNULA PARTIAL THREAD 2X7 (CANNULA) ×5 IMPLANT
CANNULA TWIST IN 8.25X9CM (CANNULA) IMPLANT
CLOSURE WOUND 1/2 X4 (GAUZE/BANDAGES/DRESSINGS) ×1
COOLER POLAR GLACIER W/PUMP (MISCELLANEOUS) ×3 IMPLANT
COUNTER NEEDLE 20/40 LG (NEEDLE) ×2 IMPLANT
COVER WAND RF STERILE (DRAPES) ×3 IMPLANT
CRADLE LAMINECT ARM (MISCELLANEOUS) ×3 IMPLANT
DEVICE SUCT BLK HOLE OR FLOOR (MISCELLANEOUS) IMPLANT
DRAPE 3/4 80X56 (DRAPES) ×3 IMPLANT
DRAPE IMP U-DRAPE 54X76 (DRAPES) ×6 IMPLANT
DRAPE INCISE IOBAN 66X45 STRL (DRAPES) ×3 IMPLANT
DRAPE U-SHAPE 47X51 STRL (DRAPES) IMPLANT
DURAPREP 26ML APPLICATOR (WOUND CARE) ×9 IMPLANT
ELECT REM PT RETURN 9FT ADLT (ELECTROSURGICAL)
ELECTRODE REM PT RTRN 9FT ADLT (ELECTROSURGICAL) IMPLANT
GAUZE SPONGE 4X4 12PLY STRL (GAUZE/BANDAGES/DRESSINGS) ×3 IMPLANT
GAUZE XEROFORM 1X8 LF (GAUZE/BANDAGES/DRESSINGS) ×3 IMPLANT
GLOVE BIOGEL PI IND STRL 9 (GLOVE) ×1 IMPLANT
GLOVE BIOGEL PI INDICATOR 9 (GLOVE) ×2
GOWN STRL REUS TWL 2XL XL LVL4 (GOWN DISPOSABLE) ×3 IMPLANT
GOWN STRL REUS W/ TWL LRG LVL3 (GOWN DISPOSABLE) ×1 IMPLANT
GOWN STRL REUS W/ TWL LRG LVL4 (GOWN DISPOSABLE) ×1 IMPLANT
GOWN STRL REUS W/TWL LRG LVL3 (GOWN DISPOSABLE) ×3
GOWN STRL REUS W/TWL LRG LVL4 (GOWN DISPOSABLE) ×3
IV LACTATED RINGER IRRG 3000ML (IV SOLUTION) ×60
IV LR IRRIG 3000ML ARTHROMATIC (IV SOLUTION) ×6 IMPLANT
KIT STABILIZATION SHOULDER (MISCELLANEOUS) ×3 IMPLANT
KIT SUTURETAK 3.0 INSERT PERC (KITS) ×3 IMPLANT
KIT TURNOVER KIT A (KITS) ×3 IMPLANT
MANIFOLD NEPTUNE II (INSTRUMENTS) ×5 IMPLANT
MASK FACE SPIDER DISP (MASK) ×3 IMPLANT
MAT ABSORB  FLUID 56X50 GRAY (MISCELLANEOUS) ×6
MAT ABSORB FLUID 56X50 GRAY (MISCELLANEOUS) ×2 IMPLANT
NEEDLE HYPO 22GX1.5 SAFETY (NEEDLE) ×3 IMPLANT
PACK ARTHROSCOPY SHOULDER (MISCELLANEOUS) ×3 IMPLANT
PAD WRAPON POLAR SHDR XLG (MISCELLANEOUS) ×1 IMPLANT
SET TUBE SUCT SHAVER OUTFL 24K (TUBING) ×3 IMPLANT
SET TUBE TIP INTRA-ARTICULAR (MISCELLANEOUS) ×3 IMPLANT
STRAP SAFETY 5IN WIDE (MISCELLANEOUS) ×3 IMPLANT
STRIP CLOSURE SKIN 1/2X4 (GAUZE/BANDAGES/DRESSINGS) ×2 IMPLANT
SUT ETHILON 4-0 (SUTURE) ×3
SUT ETHILON 4-0 FS2 18XMFL BLK (SUTURE) ×1
SUT LASSO 90 DEG SD STR (SUTURE) ×3 IMPLANT
SUT MNCRL 4-0 (SUTURE) ×3
SUT MNCRL 4-0 27XMFL (SUTURE) ×1
SUT PDS AB 0 CT1 27 (SUTURE) IMPLANT
SUT VIC AB 0 CT1 36 (SUTURE) IMPLANT
SUT VIC AB 2-0 CT2 27 (SUTURE) IMPLANT
SUTURE ETHLN 4-0 FS2 18XMF BLK (SUTURE) ×1 IMPLANT
SUTURE MAGNUM WIRE 2X48 BLK (SUTURE) IMPLANT
SUTURE MNCRL 4-0 27XMF (SUTURE) ×1 IMPLANT
TAPE MICROFOAM 4IN (TAPE) ×3 IMPLANT
TUBING ARTHRO INFLOW-ONLY STRL (TUBING) ×3 IMPLANT
TUBING CONNECTING 10 (TUBING) ×3 IMPLANT
TUBING CONNECTING 10' (TUBING) ×2
WAND HAND CNTRL MULTIVAC 90 (MISCELLANEOUS) ×3 IMPLANT
WRAPON POLAR PAD SHDR XLG (MISCELLANEOUS) ×3

## 2019-05-03 NOTE — Discharge Instructions (Signed)
AMBULATORY SURGERY  DISCHARGE INSTRUCTIONS   1) The drugs that you were given will stay in your system until tomorrow so for the next 24 hours you should not:  A) Drive an automobile B) Make any legal decisions C) Drink any alcoholic beverage   2) You may resume regular meals tomorrow.  Today it is better to start with liquids and gradually work up to solid foods.   You may eat anything you prefer, but it is better to start with liquids, then soup and crackers, and gradually work up to solid foods.   3) Please notify your doctor immediately if you have any unusual bleeding, trouble breathing, redness and pain at the surgery site, drainage, fever, or pain not relieved by medication.    4) Additional Instructions:        Please contact your physician with any problems or Same Day Surgery at [phone], Monday through Friday 6 am to 4 pm, or [facility] Main number at [phone].   Interscalene Nerve Block (ISNB) Discharge Instructions   1.  For your surgery you have received an Interscalene Nerve Block. 2. Nerve Blocks affect many types of nerves, including nerves that control movement, pain and normal sensation.  You may experience feelings such as numbness, tingling, heaviness, weakness or the inability to move your arm or the feeling or sensation that your arm has "fallen asleep". 3. A nerve block can last for 2 - 36 hours or more depending on the medication used.  Usually the weakness wears off first.  The tingling and heaviness usually wear off next.  Finally you may start to notice pain.  Keep in mind that this may occur in any order.  once a nerve block starts to wear off it is usually completely gone within 60 minutes. 4. ISNB may cause mild shortness of breath, a hoarse voice, blurry vision, unequal pupils, or drooping of the face on the same side as the nerve block.  These symptoms will usually go away within 12 hours.  Very rarely the procedure itself can  cause mild seizures. 5. If needed, your surgeon will give you a prescription for pain medication.  It will take about 60 minutes for the oral pain medication to become fully effective.  So, it is recommended that you start taking this medication before the nerve block first begins to wear off, or when you first begin to feel discomfort. 6. Keep in mind that nerve blocks often wear off in the middle of the night.   If you are going to bed and the block has not started to wear off or you have not started to have any discomfort, consider setting an alarm for 2 to 3 hours, so you can assess your block.  If you notice the block is wearing off or you are starting to have discomfort, you can take your pain medication. 7. Take your pain medication only as prescribed.  Pain medication can cause sedation and decrease your breathing if you take more than you need for the level of pain that you have. 8. Nausea is a common side effect of many pain medications.  You may want to eat something before taking your pain medicine to prevent nausea. 9. After an Interscalene nerve block, you cannot feel pain, pressure or extremes in temperature in the effected arm.  Because your arm is numb it is at an increased risk for injury.  To decrease the possibility of injury, please practice the following:  a. While   you are awake change the position of your arm frequently to prevent too much pressure on any one area for prolonged periods of time. b.  If you have a cast or tight dressing, check the color or your fingers every couple of hours.  Call your surgeon with the appearance of any discoloration (white or blue). c. If you are given a sling to wear before you go home, please wear it  at all times until the block has completely worn off.  Do not get up at night without your sling. d. If you experience any problems or concerns, please contact your surgeon's office. e. If you experience severe or prolonged shortness of breath go to  the nearest emergency department. 

## 2019-05-03 NOTE — Anesthesia Procedure Notes (Signed)
Procedure Name: Intubation Date/Time: 05/03/2019 12:35 PM Performed by: Clyde Lundborg, CRNA Pre-anesthesia Checklist: Patient identified, Emergency Drugs available, Suction available and Patient being monitored Patient Re-evaluated:Patient Re-evaluated prior to induction Oxygen Delivery Method: Circle system utilized Preoxygenation: Pre-oxygenation with 100% oxygen Induction Type: IV induction Ventilation: Mask ventilation without difficulty Laryngoscope Size: McGraph and 3 Grade View: Grade I Tube type: Oral Tube size: 7.0 mm Number of attempts: 1 Airway Equipment and Method: Video-laryngoscopy Placement Confirmation: ETT inserted through vocal cords under direct vision,  positive ETCO2,  breath sounds checked- equal and bilateral and CO2 detector Secured at: 21 cm Tube secured with: Tape Dental Injury: Teeth and Oropharynx as per pre-operative assessment

## 2019-05-03 NOTE — Op Note (Addendum)
05/03/2019  3:38 PM  PATIENT:  Brittany Sanchez  49 y.o. female  PRE-OPERATIVE DIAGNOSIS:  Tramatic Dislocation of Right Shoulder, Recurrent Anterior labral Tear  POST-OPERATIVE DIAGNOSIS:  Tramatic Dislocation of Right Shoulder, Recurrent Anterior Labral Tear  PROCEDURE:  Procedure(s): REVISION RIGHT SHOULDER ARTHROSCOPIC ANTERIOR LABRAL REPAIR   SURGEON:  Surgeon(s) and Role:    Thornton Park, MD - Primary  ANESTHESIA:   general and paracervical block   PREOPERATIVE INDICATIONS:  Brittany Sanchez is a  49 y.o. female with a diagnosis of Tramatic Dislocation of Right Shoulder, Recurrent Labral Tear.  Patient had significant right shoulder pain and apprehension after her injury.  An MR arthrogram has confirmed a recurrent tear of her anterior labrum I discussed the risks and benefits of surgery. The risks include but are not limited to infection, bleeding, nerve or blood vessel injury, joint stiffness or loss of motion, persistent pain, weakness or instability, and hardware failure and the need for further surgery. Medical risks include but are not limited to DVT and pulmonary embolism, myocardial infarction, stroke, pneumonia, respiratory failure and death. Patient understood these risks and wished to proceed.   OPERATIVE IMPLANTS: Arthrex bio suturetak anchors  4   OPERATIVE FINDINGS: Right shoulder anterior labral tear extending from from the 3:00 to 6 o'clock position, intact rotator cuff, no recurrent tear to her previous SLAP tear, posterior labrum was intact.  No focal chondral lesions.  Small Hill-Sachs lesion posteriorly.  No loose bodies.  There was no HAGL lesion.  OPERATIVE PROCEDURE:  I met with the patient preoperative area.  A pre-op history and physical was performed at the bedside.  I signed the right shoulder according the hospital's correct site of surgery protocol.  I answered all questions by the patient.  An interscalene block with Exparel was performed by the anesthesia  service in the preoperative area. Patient was then brought to the operating room where she underwent general endotracheal intubation. The patient was then positioned in a beachchair position. All bony prominences were adequately padded including the lower extremities.  Examination under anesthesia revealed significant anterior translation of the right humeral head with load and shift testing, but a negative sulcus sign.  The patient was then prepped and draped in a sterile fashion. The patient received 2 grams of ancef prior to the onset of the case.  A timeout was performed to verify the patient's name, date of birth, medical record number, correct site of surgery and correct procedure to be performed.The timeout was also used to verify the patient received antibiotics that all appropriate instruments, implants and radiographic studies were available in the room. Once all in attendance were in agreement case began.  Bony landmarks were drawn out with a surgical marker along with proposed incisions.  An 11 blade was used to establish a posterior portal through which the arthroscope was placed in the glenohumeral joint. An anterior portal was established under direct visualization using an 18-gauge spinal needle for localization. A 5.75 mm arthroscopic cannula was inserted through the anterior portal. A full diagnostic examination of the glenohumeral joint was performed.  Findings on arthroscopy included are noted above.  Patient had a positive drive-through sign with significant anterior laxity to manual anterior translation.  A 7 mm cannula was placed through this anterolateral portal.  Scar tissue in the anterior shoulder superior to the subscapularis was debrided using a 90 degree ArthroCare wand.  Adhesions connected to the subscapularis were debrided using the 90 degree ArthroCare wand. An anterolateral portal  was established again under direct visualization using an 18-gauge spinal needle.  An  arthroscopic periosteal elevator was used through the anterior portal to mobilize the the anterior labrum which had scarred onto the medial glenoid.  A 4.0 mm resector shaver blade was then used to debride the interval between the labrum and bony glenoid.  A 4.0 mm resector shaver blade was then used on burr mode to abrade the anterior glenoid prior to repair until punctate bleeding was identified.  The Arthrex drill guide was positioned at the inferior aspect of the glenoid percutaneously.    The first Arthrex BioSutureTak anchor was placed at approximately the 6 o'clock position.  The second at the 5 o'clock position, the third at the 4 o'clock position and the fourth of the 3 o'clock position position.  The inferior 2 anchors were placed through the percutaneous drill guide.  The superior two anchors were placed through the anterolateral portal.  Each anchor was placed individually and a 90 degree Arthrex suture lasso was used to shuttle a single limb of each anchor under the anterior labrum.  An arthroscopic knot tying technique was used to reduce the anterior labrum to the anterior glenoid.  This process was then repeated for the other 3 Arthrex Biosuturetak anchors.  Once all anchors were in place the anterior labrum was probed and the repair was found to be stable.  Final arthroscopic images were taken.The glenohumeral joint was then copiously irrigated.  The drive-through sign was eliminated.  Patient did not exhibit any excessive anterior translation after the anterior labral repair.  All arthroscopic instruments were then removed.   Switching sticks were then used to allow the arthroscope to place to the anterior portal.  The posterior labrum was inspected and found to be without tear.  There was no evidence of a recurrent SLAP tear or extension posteriorly.  The arthroscope was then placed into the subacromial space and there was no significant bursitis or evidence of a bursal sided rotator cuff tear.   The arthroscope was then removed.  No subacromial decompression or distal clavicle excision was performed.  The three arthroscopic portals were closed with 4-0 nylon. A dry, sterile dressing was applied to the right shoulder, along with a Polar Care sleeve. Patient's right arm was then placed in an abduction sling. The patient was awoken and brought to PACU in stable condition. I was scrubbed and present for the entire case and all sharp,sponge and instrument counts were correct at the conclusion the case. I spoke with the patient's husband by phone from the PACU let him know the operation was performed without complication and that his wife was stable in the recovery room.      Timoteo Gaul, MD

## 2019-05-03 NOTE — Anesthesia Preprocedure Evaluation (Addendum)
Anesthesia Evaluation  Patient identified by MRN, date of birth, ID band Patient awake    Reviewed: Allergy & Precautions, H&P , NPO status , Patient's Chart, lab work & pertinent test results  Airway Mallampati: II  TM Distance: >3 FB Neck ROM: full    Dental  (+) Teeth Intact   Pulmonary neg pulmonary ROS,    breath sounds clear to auscultation       Cardiovascular negative cardio ROS   Rhythm:regular Rate:Normal     Neuro/Psych PSYCHIATRIC DISORDERS Anxiety Depression negative neurological ROS     GI/Hepatic negative GI ROS, Neg liver ROS,   Endo/Other  Hypothyroidism   Renal/GU      Musculoskeletal   Abdominal   Peds  Hematology negative hematology ROS (+)   Anesthesia Other Findings Past Medical History: No date: Hypothyroidism No date: Lyme disease No date: PMS (premenstrual syndrome)  Past Surgical History: 1998: AUGMENTATION MAMMAPLASTY; Bilateral 04/06/2017: BREAST BIOPSY; Right     Comment:  affirm bx coil marker, path pending No date: BREAST ENHANCEMENT SURGERY 01/19/2017: SHOULDER ARTHROSCOPY WITH LABRAL REPAIR; Right     Comment:  Procedure: SHOULDER ARTHROSCOPY WITH LABRAL REPAIR, SLAP              REPAIR;  Surgeon: Juanell Fairly, MD;  Location: [facility]               ORS;  Service: Orthopedics;  Laterality: Right;     Reproductive/Obstetrics negative OB ROS                            Anesthesia Physical Anesthesia Plan  ASA: II  Anesthesia Plan: General ETT   Post-op Pain Management: GA combined w/ Regional for post-op pain   Induction:   PONV Risk Score and Plan: Ondansetron, Dexamethasone, Midazolam and Treatment may vary due to age or medical condition  Airway Management Planned:   Additional Equipment:   Intra-op Plan:   Post-operative Plan:   Informed Consent: I have reviewed the patients History and Physical, chart, labs and discussed the  procedure including the risks, benefits and alternatives for the proposed anesthesia with the patient or authorized representative who has indicated his/her understanding and acceptance.     Dental Advisory Given  Plan Discussed with: Anesthesiologist  Anesthesia Plan Comments:         Anesthesia Quick Evaluation

## 2019-05-03 NOTE — Anesthesia Procedure Notes (Signed)
Anesthesia Regional Block: Interscalene brachial plexus block   Pre-Anesthetic Checklist: ,, timeout performed, Correct Patient, Correct Site, Correct Laterality, Correct Procedure, Correct Position, site marked, Risks and benefits discussed,  Surgical consent,  Pre-op evaluation,  At surgeon's request and post-op pain management  Laterality: Right  Prep: chloraprep       Needles:  Injection technique: Single-shot  Needle Type: Stimiplex     Needle Length: 9cm  Needle Gauge: 21     Additional Needles:   Procedures:,,,, ultrasound used (permanent image in chart),,,,  Narrative:  Start time: 05/03/2019 10:35 AM End time: 05/03/2019 10:40 AM Injection made incrementally with aspirations every 5 mL.  Performed by: Personally  Anesthesiologist: Karleen Hampshire, MD  Additional Notes: Risks and benefits of nerve block discussed with patient, including but not limited to risk of nerve injury, bleeding, infection, and failed block.  Patient expressed understanding and consented to block placement.   Functioning IV was confirmed and monitors were applied.  Sterile prep,hand hygiene and sterile gloves were used.  Minimal sedation used for procedure.  During the procedure, there was negative aspiration, negative paresthesia on injection, and dose was given in divided aliquots under ultrasound guidance.  Patient tolerated the procedure well with no immediate complications.

## 2019-05-03 NOTE — H&P (Signed)
PREOPERATIVE H&P  Chief Complaint: Tramatic Dislocation of Right Shoulder, Recurrent Labral Tear  HPI: Brittany Sanchez is a 49 y.o. female who presents for preoperative history and physical with a diagnosis of Tramatic Dislocation of Right Shoulder, Recurrent Labral Tear. Symptoms of right shoulder pain, limited range of motion and weakness are significantly impairing activities of daily living.  Patient had her right anterior labrum and SLAP tear repaired on 01/19/2017 by me here at [facility].  Patient reinjured her right shoulder when she fell skiing.  An MR arthrogram has demonstrated the anterior labrum has retorn.  The SLAP tear appears to be intact.  The patient wished to proceed with revision repair of her anterior labrum..   Past Medical History:  Diagnosis Date  . Hypothyroidism   . Lyme disease   . PMS (premenstrual syndrome)    Past Surgical History:  Procedure Laterality Date  . AUGMENTATION MAMMAPLASTY Bilateral 1998  . BREAST BIOPSY Right 04/06/2017   affirm bx coil marker, path pending  . BREAST ENHANCEMENT SURGERY    . SHOULDER ARTHROSCOPY WITH LABRAL REPAIR Right 01/19/2017   Procedure: SHOULDER ARTHROSCOPY WITH LABRAL REPAIR, SLAP REPAIR;  Surgeon: Juanell Fairly, MD;  Location: [facility] ORS;  Service: Orthopedics;  Laterality: Right;   Social History   Socioeconomic History  . Marital status: Married    Spouse name: stephen  . Number of children: 2  . Years of education: Not on file  . Highest education level: Not on file  Occupational History  . Occupation: teaches high school    Employer: [employer]  Tobacco Use  . Smoking status: Never Smoker  . Smokeless tobacco: Never Used  Substance and Sexual Activity  . Alcohol use: Yes    Alcohol/week: 0.0 standard drinks    Comment: OCC  . Drug use: No  . Sexual activity: Yes    Partners: Male    Birth control/protection: Rhythm  Other Topics Concern  . Not on file   Social History Narrative   Gym 3-4 times a week.     Lives with husband in a two story home.  Lives with one daughter and her brother   Teaches high school, ESL.     Highest level of education:  Masters   Chemical engineer Strain:   . Difficulty of Paying Living Expenses:   Food Insecurity:   . Worried About Programme researcher, broadcasting/film/video in the Last Year:   . Barista in the Last Year:   Transportation Needs:   . Freight forwarder (Medical):   Marland Kitchen Lack of Transportation (Non-Medical):   Physical Activity:   . Days of Exercise per Week:   . Minutes of Exercise per Session:   Stress:   . Feeling of Stress :   Social Connections:   . Frequency of Communication with Friends and Family:   . Frequency of Social Gatherings with Friends and Family:   . Attends Religious Services:   . Active Member of Clubs or Organizations:   . Attends Banker Meetings:   Marland Kitchen Marital Status:    Family History  Problem Relation Age of Onset  . Cancer Mother 25       colon cancer  . Sleep apnea Brother   . Alcohol abuse Other   . Depression Other   . Cancer Father 67       mesothelioma  . Cancer Maternal Grandmother        ?  ovarian  . Diabetes Maternal Grandfather   . Breast cancer Neg Hx    Allergies  Allergen Reactions  . Seasonal Ic [Cholestatin] Other (See Comments)    NASAL SYMPTOMS, RUNNY NOSE   Prior to Admission medications   Medication Sig Start Date End Date Taking? Authorizing Provider  FLUoxetine (PROZAC) 40 MG capsule Take 40 mg by mouth at bedtime. 04/04/19  Yes [provider]  ibuprofen (ADVIL) 200 MG tablet Take 600 mg by mouth every 6 (six) hours as needed.   Yes [provider]  meloxicam (MOBIC) 15 MG tablet Take 15 mg by mouth daily as needed for pain. 03/12/19  Yes [provider]  Multiple Vitamin (MULTIVITAMIN WITH MINERALS) TABS tablet Take 1 tablet by mouth daily.   Yes [provider]   naltrexone (DEPADE) 50 MG tablet Take 25 mg by mouth daily. For neuropathies. Takes at night   Yes [provider]  oxyCODONE (OXY IR/ROXICODONE) 5 MG immediate release tablet Take 5 mg by mouth every 4 (four) hours as needed for pain. 04/13/19  Yes [provider]  progesterone (PROMETRIUM) 100 MG capsule TAKE 2 CAPSULES (200 MG TOTAL) BY MOUTH DAILY. Patient taking differently: Take 200 mg by mouth at bedtime.  10/11/18  Yes Shambley, Melody N, CNM  thyroid (ARMOUR) 60 MG tablet Take 1 tablet (60 mg total) by mouth daily before breakfast. Take 1qd with 15mg  to equal 75mg  total daily 02/21/18  Yes Shambley, Melody N, CNM  traZODone (DESYREL) 50 MG tablet Take 0.5 tablets (25 mg total) by mouth as needed. Patient taking differently: Take 50 mg by mouth at bedtime.  04/05/18  Yes Shambley, Melody N, CNM  thyroid (ARMOUR) 15 MG tablet Take 1 tablet (15 mg total) by mouth daily. Take 1qd with 60mg  to equal 75mg  total daily 02/21/18   Shambley, Melody N, CNM     Positive ROS: All other systems have been reviewed and were otherwise negative with the exception of those mentioned in the HPI and as above.  Physical Exam: General: Alert, no acute distress Cardiovascular: Regular rate and rhythm, no murmurs rubs or gallops.  No pedal edema Respiratory: Clear to auscultation bilaterally, no wheezes rales or rhonchi. No cyanosis, no use of accessory musculature GI: No organomegaly, abdomen is soft and non-tender nondistended with positive bowel sounds. Skin: Skin intact, no lesions within the operative field. Neurologic: Sensation intact distally Psychiatric: Patient is competent for consent with normal mood and affect Lymphatic: No cervical lymphadenopathy  MUSCULOSKELETAL: Right shoulder: Patient skin is intact.  There is no erythema ecchymosis or effusion.  Patient has intact sensation light touch throughout the right upper extremity.  She has full digital wrist and elbow range of motion,  intact/light touch and palpable radial pulse.  Patient had significant limitation of shoulder motion due to pain.  She does not demonstrate weakness of the rotator cuff.  Assessment: Tramatic Dislocation of Right Shoulder, Recurrent Labral Tear  Plan: Plan for Procedure(s): RIGHT SHOULDER ARTHROSCOPY WITH ANTERIOR LABRAL REPAIR & Capsulorrhaphy  I reviewed the MR arthrogram results with the patient.  We discussed nonoperative versus operative treatment.  Patient is elected to proceed with revision surgical repair of the anterior labrum.  I reviewed the details of the operation as well as the postoperative course.  I discussed the risks and benefits of surgery. The risks include but are not limited to infection, bleeding , nerve or blood vessel injury, joint stiffness or loss of motion, persistent pain, weakness or instability, retear  of the labrum, failure of the repair and hardware failure and the need for further surgery including open capsulorrhaphy. Medical risks include but are not limited to DVT and pulmonary embolism, myocardial infarction, stroke, pneumonia, respiratory failure and death. Patient understood these risks and wished to proceed.     Thornton Park, MD   05/03/2019 12:21 PM

## 2019-05-03 NOTE — Transfer of Care (Signed)
Immediate Anesthesia Transfer of Care Note  Patient: Brittany Sanchez  Procedure(s) Performed: SHOULDER ARTHROSCOPY WITH LABRAL REPAIR & Capsulorrhaphy (Right Shoulder)  Patient Location: PACU  Anesthesia Type:General  Level of Consciousness: awake, alert  and oriented  Airway & Oxygen Therapy: Patient Spontanous Breathing and Patient connected to face mask oxygen  Post-op Assessment: VSS  Post vital signs: Reviewed and stable  Last Vitals:  Vitals Value Taken Time  BP 118/69 05/03/19 1514  Temp 36.2 C 05/03/19 1513  Pulse 77 05/03/19 1514  Resp 20 05/03/19 1514  SpO2 100 % 05/03/19 1514    Last Pain:  Vitals:   05/03/19 0946  PainSc: 0-No pain         Complications: No apparent anesthesia complications

## 2019-05-04 NOTE — Anesthesia Postprocedure Evaluation (Signed)
Anesthesia Post Note  Patient: Brittany Sanchez  Procedure(s) Performed: SHOULDER ARTHROSCOPY WITH LABRAL REPAIR & Capsulorrhaphy (Right Shoulder)  Patient location during evaluation: PACU Anesthesia Type: General Level of consciousness: awake and alert Pain management: pain level controlled Vital Signs Assessment: post-procedure vital signs reviewed and stable Respiratory status: spontaneous breathing, nonlabored ventilation and respiratory function stable Cardiovascular status: blood pressure returned to baseline and stable Postop Assessment: no apparent nausea or vomiting Anesthetic complications: no     Last Vitals:  Vitals:   05/03/19 1540 05/03/19 1552  BP: 101/60 103/66  Pulse: 75 76  Resp: 13 18  Temp: 36.4 C (!) 36.2 C  SpO2: 99% 99%    Last Pain:  Vitals:   05/04/19 0815  TempSrc:   PainSc: 0-No pain                 Karleen Hampshire

## 2019-06-07 ENCOUNTER — Encounter (INDEPENDENT_AMBULATORY_CARE_PROVIDER_SITE_OTHER): Payer: Self-pay | Admitting: Vascular Surgery

## 2019-06-07 ENCOUNTER — Other Ambulatory Visit: Payer: Self-pay

## 2019-06-07 ENCOUNTER — Ambulatory Visit (INDEPENDENT_AMBULATORY_CARE_PROVIDER_SITE_OTHER): Payer: BC Managed Care – PPO | Admitting: Vascular Surgery

## 2019-06-07 DIAGNOSIS — I83812 Varicose veins of left lower extremities with pain: Secondary | ICD-10-CM

## 2019-06-07 DIAGNOSIS — I83819 Varicose veins of unspecified lower extremities with pain: Secondary | ICD-10-CM | POA: Insufficient documentation

## 2019-06-07 NOTE — Progress Notes (Signed)
    MRN : [id number]  Brittany Sanchez is a 49 y.o. (Jul 01, 1970) female who presents with chief complaint of painful varicose veins.    The patient's left lower extremity was sterilely prepped and draped.  The ultrasound machine was used to visualize the left great saphenous vein throughout its course.  A segment below the knee was selected for access.  The saphenous vein was accessed without difficulty using ultrasound guidance with a micropuncture needle.   An 0.018  wire was placed beyond the saphenofemoral junction through the sheath and the microneedle was removed.  The 65 cm sheath was then placed over the wire and the wire and dilator were removed.  The laser fiber was placed through the sheath and its tip was placed approximately 2 cm below the saphenofemoral junction.  Tumescent anesthesia was then created with a dilute lidocaine solution.  Laser energy was then delivered with constant withdrawal of the sheath and laser fiber.  Approximately 1742 Joules of energy were delivered over a length of 39 cm.  Sterile dressings were placed.  The patient tolerated the procedure well without complications.

## 2019-06-11 ENCOUNTER — Ambulatory Visit (INDEPENDENT_AMBULATORY_CARE_PROVIDER_SITE_OTHER): Payer: BC Managed Care – PPO

## 2019-06-11 ENCOUNTER — Other Ambulatory Visit: Payer: Self-pay

## 2019-06-11 ENCOUNTER — Other Ambulatory Visit (INDEPENDENT_AMBULATORY_CARE_PROVIDER_SITE_OTHER): Payer: Self-pay | Admitting: Vascular Surgery

## 2019-06-11 DIAGNOSIS — I83812 Varicose veins of left lower extremities with pain: Secondary | ICD-10-CM | POA: Diagnosis not present

## 2019-06-14 ENCOUNTER — Ambulatory Visit: Payer: BC Managed Care – PPO | Admitting: Family Medicine

## 2019-08-07 ENCOUNTER — Ambulatory Visit: Payer: BC Managed Care – PPO | Admitting: Family Medicine

## 2019-08-13 ENCOUNTER — Encounter: Payer: Self-pay | Admitting: Family Medicine

## 2019-08-13 ENCOUNTER — Ambulatory Visit (INDEPENDENT_AMBULATORY_CARE_PROVIDER_SITE_OTHER): Payer: BC Managed Care – PPO | Admitting: Family Medicine

## 2019-08-13 ENCOUNTER — Other Ambulatory Visit: Payer: Self-pay

## 2019-08-13 VITALS — BP 98/60 | HR 64 | Temp 98.0°F | Ht 62.5 in | Wt 146.2 lb

## 2019-08-13 DIAGNOSIS — A6922 Other neurologic disorders in Lyme disease: Secondary | ICD-10-CM

## 2019-08-13 DIAGNOSIS — G4700 Insomnia, unspecified: Secondary | ICD-10-CM | POA: Diagnosis not present

## 2019-08-13 DIAGNOSIS — E039 Hypothyroidism, unspecified: Secondary | ICD-10-CM | POA: Diagnosis not present

## 2019-08-13 DIAGNOSIS — F3281 Premenstrual dysphoric disorder: Secondary | ICD-10-CM

## 2019-08-13 NOTE — Assessment & Plan Note (Addendum)
Taking naltrexone for this which is working. Prescribed by Dr. Lelon Huh - will request records.

## 2019-08-13 NOTE — Assessment & Plan Note (Signed)
Follows with integrative medicine specialist who prescribes

## 2019-08-13 NOTE — Progress Notes (Signed)
Subjective:     Brittany Sanchez is a 49 y.o. female presenting for Establish Care     HPI   #Lymes disease - was diagnosed with lyme's disease by an integrative health provider - Dr. Lelon Huh  - integrative specialist does her trazodone as well   #hypothyroid - due for blood work with her provider  #anxiety/depression - has not been prozac - was taking for premenstrual mood disorder - was taking every day - but was initially started for as needed near periods - had an ablation so having a hard time tracking     Review of Systems   Social History   Tobacco Use  Smoking Status Never Smoker  Smokeless Tobacco Never Used        Objective:    BP Readings from Last 3 Encounters:  08/13/19 98/60  06/07/19 (!) 96/54  05/03/19 103/66   Wt Readings from Last 3 Encounters:  08/13/19 146 lb 4 oz (66.3 kg)  06/07/19 148 lb (67.1 kg)  05/03/19 140 lb (63.5 kg)    BP 98/60   Pulse 64   Temp 98 F (36.7 C) (Temporal)   Ht 5' 2.5" (1.588 m)   Wt 146 lb 4 oz (66.3 kg)   SpO2 96%   BMI 26.32 kg/m    Physical Exam Constitutional:      General: She is not in acute distress.    Appearance: She is well-developed. She is not diaphoretic.  HENT:     Right Ear: External ear normal.     Left Ear: External ear normal.  Eyes:     Conjunctiva/sclera: Conjunctivae normal.  Cardiovascular:     Rate and Rhythm: Normal rate.  Pulmonary:     Effort: Pulmonary effort is normal.  Musculoskeletal:     Cervical back: Neck supple.  Skin:    General: Skin is warm and dry.     Capillary Refill: Capillary refill takes less than 2 seconds.  Neurological:     Mental Status: She is alert. Mental status is at baseline.  Psychiatric:        Mood and Affect: Mood normal.        Behavior: Behavior normal.           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 Prescribed by Dr. Lelon Huh - will request records.         Other   Insomnia    Suspect menopausal vs lyme related. Will obtain records as patient notes taking progesterone and trazodone to help with sleep - both effective.       PMDD (premenstrual dysphoric disorder)    Self weaning off prozac and notes she feels OK. Discussed continuing to wean. Has noticed some memory issues - discussed this could be medication or depression or menopause. Encouraged memory exercises and if no improvement return for assessment and blood work as needed          Return in about 1 year (around 08/12/2020) for annual or sooner.  Lynnda Child, MD  This visit occurred during the SARS-CoV-2 public health emergency.  Safety protocols were in place, including screening questions prior to the visit, additional usage of staff PPE, and  extensive cleaning of exam room while observing appropriate contact time as indicated for disinfecting solutions.

## 2019-08-13 NOTE — Patient Instructions (Signed)
Great to meet you!   Work on [employer] and keeping a calendar and notes to help you remember things  Come back if you feel like it is not getting better

## 2019-08-13 NOTE — Assessment & Plan Note (Signed)
Suspect menopausal vs lyme related. Will obtain records as patient notes taking progesterone and trazodone to help with sleep - both effective.

## 2019-08-13 NOTE — Assessment & Plan Note (Signed)
Self weaning off prozac and notes she feels OK. Discussed continuing to wean. Has noticed some memory issues - discussed this could be medication or depression or menopause. Encouraged memory exercises and if no improvement return for assessment and blood work as needed

## 2019-10-04 ENCOUNTER — Telehealth: Payer: Self-pay

## 2019-10-04 ENCOUNTER — Telehealth: Payer: BC Managed Care – PPO | Admitting: Family Medicine

## 2019-10-04 NOTE — Telephone Encounter (Signed)
Pt canceled appointment.

## 2019-10-04 NOTE — Telephone Encounter (Signed)
Per chart review tab pt has my chart video visit 10/04/19 at 4:20 with Dr Selena Batten.

## 2019-10-04 NOTE — Telephone Encounter (Signed)
[facility] Night - Client Nonclinical Telephone Record AccessNurse Client [facility] Night - Client Client Site [facility] - Night Physician AA - PHYSICIAN, Crissie Figures- MD Contact Type Call Who Is Calling Patient / Member / Family / Caregiver Caller Name Asta Corbridge Caller Phone Number [phone] Patient Name Brittany Sanchez Patient DOB 21-Oct-1970 Call Type Message Only Information Provided Reason for Call Request to Schedule Office Appointment Initial Comment Caller states she wants to make a sick appointment today if possible. She has to have a doctor's note to go back to work. Tested neg for COVID. Additional Comment Please call as soon as possible. Disp. Time Disposition Final User 10/04/2019 7:50:14 AM General Information Provided Yes Lucretia Roers, Amy Call Closed By: Angelique Blonder Transaction Date/Time: 10/04/2019 7:48:03 AM (ET)

## 2019-12-06 ENCOUNTER — Other Ambulatory Visit: Payer: Self-pay

## 2019-12-06 ENCOUNTER — Ambulatory Visit (INDEPENDENT_AMBULATORY_CARE_PROVIDER_SITE_OTHER): Payer: BC Managed Care – PPO | Admitting: Dermatology

## 2019-12-06 DIAGNOSIS — L0291 Cutaneous abscess, unspecified: Secondary | ICD-10-CM

## 2019-12-06 DIAGNOSIS — L02413 Cutaneous abscess of right upper limb: Secondary | ICD-10-CM

## 2019-12-06 MED ORDER — DOXYCYCLINE HYCLATE 100 MG PO TABS: 100.0000 mg | ORAL_TABLET | Freq: Two times a day (BID) | ORAL | 0 refills | Status: AC

## 2019-12-06 MED ORDER — MUPIROCIN 2 % EX OINT: 1.0000 "application " | TOPICAL_OINTMENT | Freq: Every day | CUTANEOUS | 0 refills | Status: DC

## 2019-12-06 NOTE — Progress Notes (Signed)
   Follow-Up Visit   Subjective  Brittany Sanchez is a 49 y.o. female who presents for the following: Cyst (Has been inflamed for ~ a week. She stuck a pin in it but it did not drain. It is very sore.).  The following portions of the chart were reviewed this encounter and updated as appropriate:  Tobacco  Allergies  Meds  Problems  Med Hx  Surg Hx  Fam Hx     Review of Systems:  No other skin or systemic complaints except as noted in HPI or Assessment and Plan.  Objective  Well appearing patient in no apparent distress; mood and affect are within normal limits.  A focused examination was performed including right shoulder. Relevant physical exam findings are noted in the Assessment and Plan.  Objective  Right deltoid: 3.0 cm tender red nodule   Assessment & Plan  Abscess Right deltoid Ruptured cyst with abscess formation  Start Doxycycline 100 mg 1 po bid with food and plenty of fluid x 10 days  Mupirocin oint qd with dressing changes  doxycycline (VIBRA-TABS) 100 MG tablet - Right deltoid  mupirocin ointment (BACTROBAN) 2 % - Right deltoid  Incision and Drainage - Right deltoid Location: right deltoid  Informed Consent: Discussed risks (permanent scarring, light or dark discoloration, infection, pain, bleeding, bruising, redness, damage to adjacent structures, and recurrence of the lesion) and benefits of the procedure, as well as the alternatives.  Informed consent was obtained.  Preparation: The area was prepped with alcohol.  Anesthesia: Lidocaine 1% with epinephrine  Procedure Details: An incision was made overlying the lesion. The lesion drained pus and blood. Curettage and irrigation performed. A large amount of fluid was drained.    Antibiotic ointment and a sterile pressure dressing were applied. The patient tolerated procedure well.  Total number of lesions drained: 1  Plan: The patient was instructed on post-op care. Recommend OTC analgesia as needed  for pain.  Return in about 2 months (around 02/05/2020) for cyst follow up.   I, Joanie Coddington, CMA, am acting as scribe for Armida Sans, MD .  Documentation: I have reviewed the above documentation for accuracy and completeness, and I agree with the above.  Armida Sans, MD

## 2019-12-06 NOTE — Patient Instructions (Signed)

## 2019-12-07 ENCOUNTER — Encounter: Payer: Self-pay | Admitting: Dermatology

## 2019-12-10 ENCOUNTER — Ambulatory Visit: Payer: BC Managed Care – PPO | Admitting: Dermatology

## 2020-01-08 ENCOUNTER — Telehealth: Payer: Self-pay

## 2020-01-08 NOTE — Telephone Encounter (Signed)
mychart message sent to patient to schedule an AE.

## 2020-02-20 ENCOUNTER — Ambulatory Visit: Payer: Self-pay | Admitting: Dermatology

## 2020-08-12 ENCOUNTER — Ambulatory Visit: Payer: Self-pay | Admitting: Family Medicine

## 2020-08-25 ENCOUNTER — Other Ambulatory Visit: Payer: Self-pay

## 2020-08-25 ENCOUNTER — Ambulatory Visit (INDEPENDENT_AMBULATORY_CARE_PROVIDER_SITE_OTHER): Payer: 59 | Admitting: Family Medicine

## 2020-08-25 VITALS — BP 90/70 | HR 58 | Temp 98.0°F | Ht 63.0 in | Wt 149.0 lb

## 2020-08-25 DIAGNOSIS — K5900 Constipation, unspecified: Secondary | ICD-10-CM

## 2020-08-25 DIAGNOSIS — Z1211 Encounter for screening for malignant neoplasm of colon: Secondary | ICD-10-CM

## 2020-08-25 DIAGNOSIS — R198 Other specified symptoms and signs involving the digestive system and abdomen: Secondary | ICD-10-CM

## 2020-08-25 NOTE — Progress Notes (Signed)
Subjective:     Brittany Sanchez is a 50 y.o. female presenting for Rectal Pain ("Pressure" for around 12 days each month.)     HPI  #Rectal pressure - pressure in the rectal area - endorses pain - nothing palpable with wiping - BM - has a hard time passing stools when she has the pressure - stools daily, but straining more when the pressure is pressure - generally is having a harder time passing stools - no blood in stool or with wiping - endorses some urinary frequency - no dysuria - some stress incontinence   Review of Systems   Social History   Tobacco Use  Smoking Status Never  Smokeless Tobacco Never        Objective:    BP Readings from Last 3 Encounters:  08/25/20 90/70  08/13/19 98/60  06/07/19 (!) 96/54   Wt Readings from Last 3 Encounters:  08/25/20 149 lb (67.6 kg)  08/13/19 146 lb 4 oz (66.3 kg)  06/07/19 148 lb (67.1 kg)    BP 90/70   Pulse (!) 58   Temp 98 F (36.7 C) (Temporal)   Ht 5\' 3"  (1.6 m)   Wt 149 lb (67.6 kg)   SpO2 98%   BMI 26.39 kg/m    Physical Exam Exam conducted with a chaperone present.  Constitutional:      General: She is not in acute distress.    Appearance: She is well-developed. She is not diaphoretic.  HENT:     Right Ear: External ear normal.     Left Ear: External ear normal.  Eyes:     Conjunctiva/sclera: Conjunctivae normal.  Cardiovascular:     Rate and Rhythm: Normal rate and regular rhythm.     Heart sounds: No murmur heard. Pulmonary:     Effort: Pulmonary effort is normal. No respiratory distress.     Breath sounds: Normal breath sounds. No wheezing.  Genitourinary:    Rectum: Normal. No tenderness, external hemorrhoid or internal hemorrhoid.  Musculoskeletal:     Cervical back: Neck supple.  Skin:    General: Skin is warm and dry.     Capillary Refill: Capillary refill takes less than 2 seconds.  Neurological:     Mental Status: She is alert. Mental status is at baseline.  Psychiatric:         Mood and Affect: Mood normal.        Behavior: Behavior normal.          Assessment & Plan:   Problem List Items Addressed This Visit       Other   Rectal pressure - Primary    Exam reassuring (no mass, hemorrhoid, lesion). Suspect she may have some constipation w/ or w/o prolapse. Discussed treatment for constipation and if no improvement would recommend OB/GYN given stress incontinence for assessment. Also briefly discussed pelvic PT as an option as well. Due to routine colon cancer screening - referral placed.        Other Visit Diagnoses     Colon cancer screening       Relevant Orders   Ambulatory referral to Gastroenterology   Constipation, unspecified constipation type          I spent >15 minutes with pt , obtaining history, examining, reviewing chart, documenting encounter and discussing the above plan of care.   Return if symptoms worsen or fail to improve.  , MD  This visit occurred during the SARS-CoV-2 public health emergency.  Safety protocols  were in place, including screening questions prior to the visit, additional usage of staff PPE, and extensive cleaning of exam room while observing appropriate contact time as indicated for disinfecting solutions.

## 2020-08-25 NOTE — Patient Instructions (Addendum)
Rectal pressure - start treatment below for constipation - get a squatty potty or step stool to raise your feet when having a bowel movement - avoid straining - if no improvement and still having symptoms let me know and will plan to have an OB/GYN referral for evaluation (given also having urinary incontinence)  Constipation   Constipation is a common issue. Often it is related to diet and occasionally medications.    What you can do to treat your symptoms 1) Fiber -- Eat more fiber rich foods: beans, broccoli, berries, avocados, popcorn, pear/apple, green peas, turnip greens, brussels sprouts, whole grains (barley, bran, quinoa, oatmeal) -- Take a Fiber supplement: Psyllium (Metamucil)  -- Could also eat Prunes daily  2) Hydration  -- Drink more water: Try to drink 64 oz of water per day  3) Exercise -- Moderate exercise (walking, jogging, biking) for 30 minutes, 5 days a week  4) Dedicate time for Bowel movements - do not delay  5) Stool Softener  - Docusate Sodium (Colace) 100 mg daily or twice daily as needed   If 4-6 weeks have passed and the above has not helped then start the following 6) Laxatives -- Polyethylene Glycol (Miralax) - begin with once daily. After a few days can increase to twice daily Or -- Magnesium Citrate -- Common side effect is nausea and diarrhea -- can try if still not improved   Treating chronic constipation is often about finding the right amount of medication and fiber to keep you regular and comfortable. For some people that may be daily metamucil and colace every other day. For others it may be Metamucil and colace twice daily and Miralax 3 times a week. The goal is to go slow and listen to your body. And normal can be anywhere from 2-3 soft bowel movements a day to 1 bowel movement every 2-3 days.   Due to there being a large influx of referrals the GI offices are currently scheduling appointments as far out as late Aug/Sept 2022. They have  asked that you call their office regarding your referral and scheduling needs. Otherwise, they will reach out to you as soon as they are able. They are working diligently to get patients called and scheduled as soon as possible.  Colonoscopies will be called according to date/time of the referral entry as these are not of urgent need.     [facility]  [phone] [facility]  [phone]  [facility] can be considered as well. They may be able to book sooner appointments. You can reach [facility] at [phone].   Thank you for your patience and please let us know if you have any questions or concerns.

## 2020-08-25 NOTE — Assessment & Plan Note (Signed)
Exam reassuring (no mass, hemorrhoid, lesion). Suspect she may have some constipation w/ or w/o prolapse. Discussed treatment for constipation and if no improvement would recommend OB/GYN given stress incontinence for assessment. Also briefly discussed pelvic PT as an option as well. Due to routine colon cancer screening - referral placed.

## 2020-08-28 ENCOUNTER — Other Ambulatory Visit: Payer: Self-pay

## 2020-08-28 MED ORDER — NA SULFATE-K SULFATE-MG SULF 17.5-3.13-1.6 GM/177ML PO SOLN: 1.0000 | ORAL | 0 refills | Status: DC

## 2020-09-01 IMAGING — MR MR SHOULDER*R* W/CM
6 series · 40 of 40 positions shown · IV contrast (agent unspecified)
Comparison: MR right shoulder arthrogram dated November 11, 2016.

CLINICAL DATA: Intermittent right shoulder pain and limited range
of motion since skiing injury a month ago. Prior labral repair in
2191.

EXAM:
MR ARTHROGRAM OF THE RIGHT SHOULDER
TECHNIQUE: Multiplanar, multisequence MR imaging of the right shoulder was
performed following the administration of intra-articular contrast.
CONTRAST:  See Injection Documentation.

[Series 5: T1 fat-sat · axial · right · 4.0mm · 0.55mm/px · z∈[-85,+34]mm · 9 of 25 slices shown (1 of 3)]
[im 1/25]
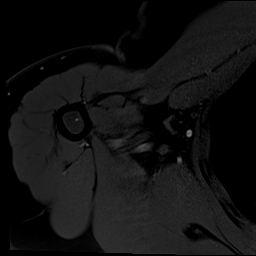
[im 4/25]
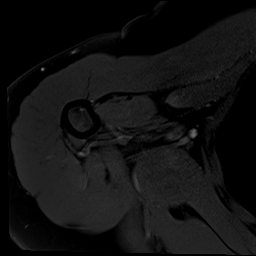
[im 7/25]
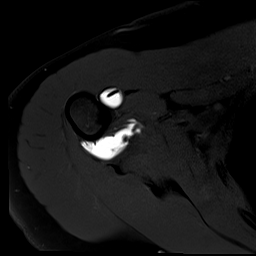
[im 10/25]
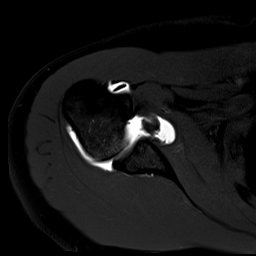
[im 13/25]
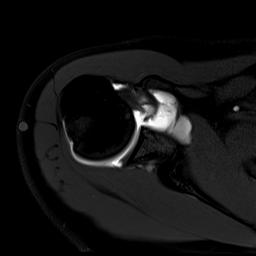
[im 16/25]
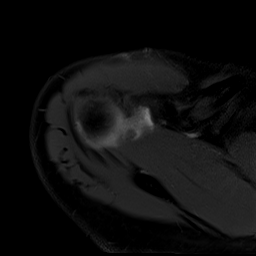
[im 19/25]
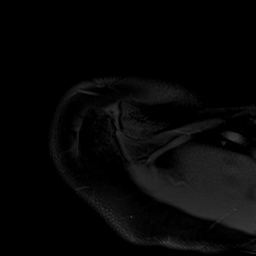
[im 22/25]
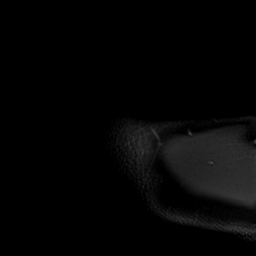
[im 25/25]
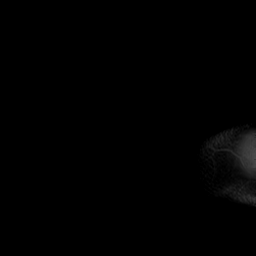

[Series 6: T2 fat-sat · oblique · right · 4.0mm · 0.55mm/px · 8 of 25 slices shown (1 of 2)]
[im 1/25]
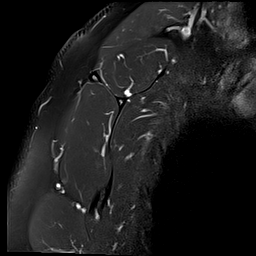
[im 4/25]
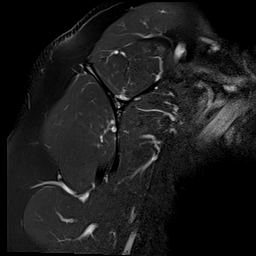
[im 7/25]
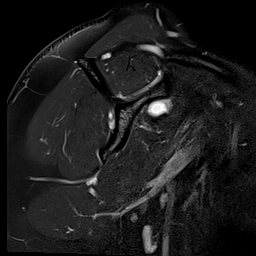
[im 11/25]
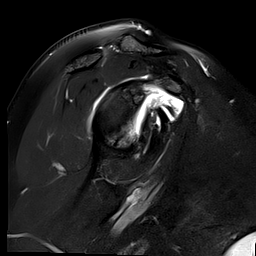
[im 14/25]
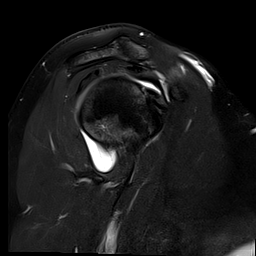
[im 18/25]
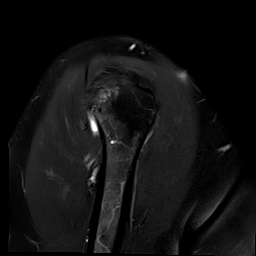
[im 21/25]
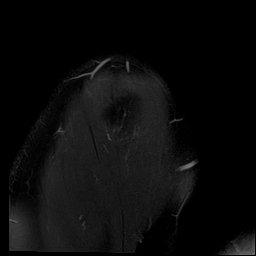
[im 25/25]
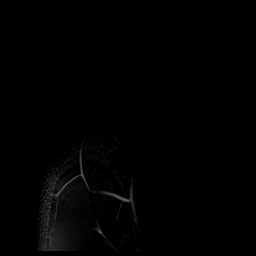

[Series 7: T1 fat-sat · oblique · right · 4.0mm · 0.55mm/px · 5 of 16 slices shown (2 of 3)]
[im 1/16]
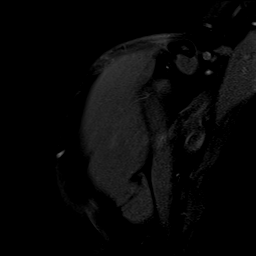
[im 4/16]
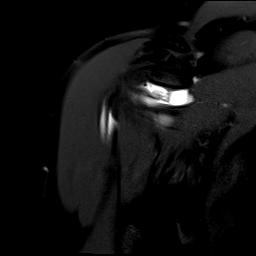
[im 8/16]
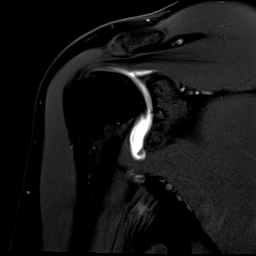
[im 12/16]
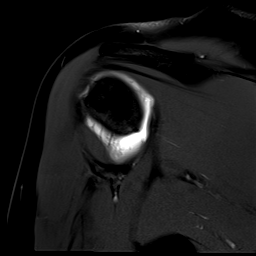
[im 16/16]
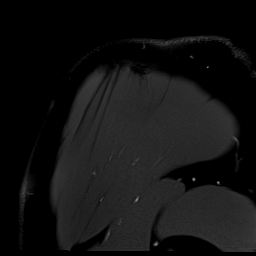

[Series 8: T2 fat-sat · oblique · right · 4.0mm · 0.55mm/px · 5 of 16 slices shown (2 of 2)]
[im 1/16]
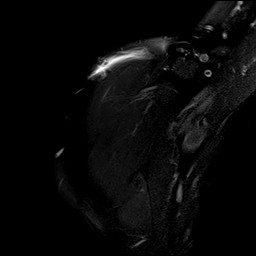
[im 4/16]
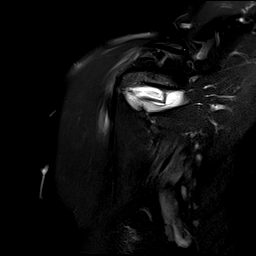
[im 8/16]
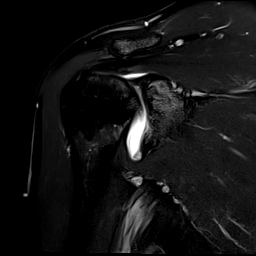
[im 12/16]
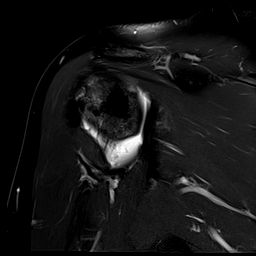
[im 16/16]
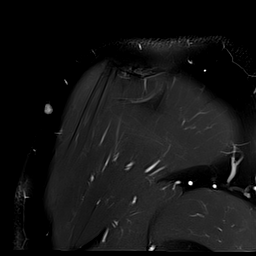

[Series 9: T1 · oblique · right · 4.0mm · 0.51mm/px · 5 of 16 slices shown]
[im 1/16]
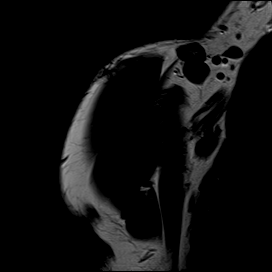
[im 4/16]
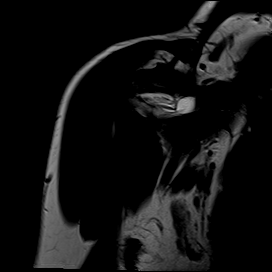
[im 8/16]
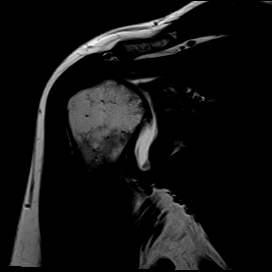
[im 12/16]
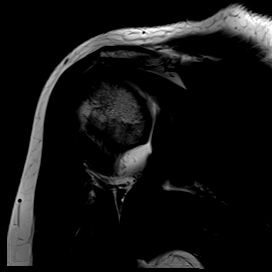
[im 16/16]
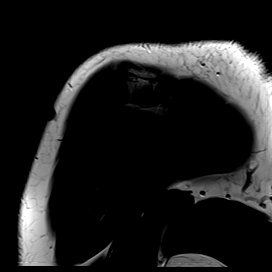

[Series 12: T1 fat-sat · sagittal · right · 4.0mm · 0.62mm/px · 8 of 26 slices shown (3 of 3)]
[im 1/26]
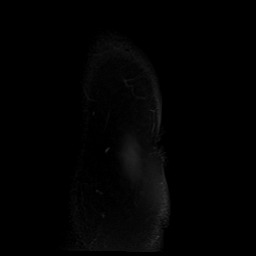
[im 4/26]
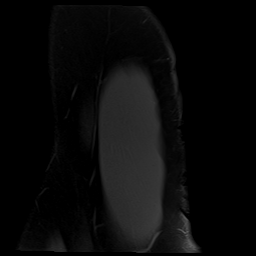
[im 8/26]
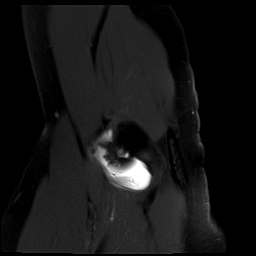
[im 11/26]
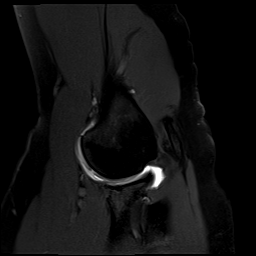
[im 15/26]
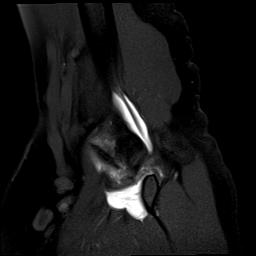
[im 18/26]
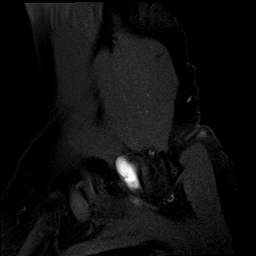
[im 22/26]
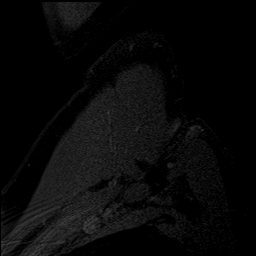
[im 26/26]
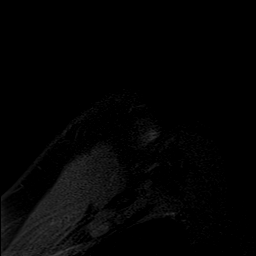

[40 of 40 positions shown; findings below may reference images not displayed]

FINDINGS: Rotator cuff: Intact rotator cuff. New mild supraspinatus and
infraspinatus tendinosis.

Muscles:  No focal muscular atrophy or edema.

Biceps long head:  Intact and normally positioned.

Acromioclavicular Joint: The acromion is type I. Normal
acromioclavicular joint. No significant fluid or contrast is present
in the subacromial/subdeltoid bursa.

Glenohumeral Joint: Distended with intra-articular contrast. No
chondral defect.

Labrum: Evidence of prior anterior labral repair. Recurrent complete
tear of the anterior labrum extending from 1-5 o'clock. The biceps
labral anchor repair appears intact. Additional new small tear of
the posterosuperior labrum (series 5, image 13).

Bones: Recurrent small impaction fracture of the posterosuperior
humeral head with progressive flattening when compared to prior
study. No dislocation. No suspicious bone lesion.

Other: No significant soft tissue findings.
IMPRESSION: 1. Recurrent complete tear of the anterior labrum from 1-5 o'clock.
The biceps labral anchor repair appears intact. Additional new small
tear of the posterosuperior labrum.
2. Recurrent small impaction fracture of the posterosuperior humeral
head with progressive flattening when compared to prior study.
3. New mild supraspinatus and infraspinatus tendinosis. No rotator
cuff tear.

## 2020-09-29 ENCOUNTER — Ambulatory Visit: Payer: 59 | Admitting: Anesthesiology

## 2020-09-29 ENCOUNTER — Encounter
Admission: RE | Disposition: A | Discharge status: Home / Self Care | Payer: Self-pay | Source: Home / Self Care | Attending: Gastroenterology

## 2020-09-29 ENCOUNTER — Other Ambulatory Visit: Payer: Self-pay

## 2020-09-29 ENCOUNTER — Ambulatory Visit
Admission: RE | Admit: 2020-09-29 | Discharge: 2020-09-29 | Disposition: A | Discharge status: Home / Self Care | Payer: 59 | Attending: Gastroenterology | Admitting: Gastroenterology

## 2020-09-29 ENCOUNTER — Encounter: Payer: Self-pay | Admitting: Gastroenterology

## 2020-09-29 DIAGNOSIS — Z836 Family history of other diseases of the respiratory system: Secondary | ICD-10-CM | POA: Diagnosis not present

## 2020-09-29 DIAGNOSIS — Z79890 Hormone replacement therapy (postmenopausal): Secondary | ICD-10-CM | POA: Diagnosis not present

## 2020-09-29 DIAGNOSIS — Z800 Family history of malignant neoplasm of digestive organs: Secondary | ICD-10-CM | POA: Diagnosis not present

## 2020-09-29 DIAGNOSIS — Z8041 Family history of malignant neoplasm of ovary: Secondary | ICD-10-CM | POA: Insufficient documentation

## 2020-09-29 DIAGNOSIS — Z833 Family history of diabetes mellitus: Secondary | ICD-10-CM | POA: Diagnosis not present

## 2020-09-29 DIAGNOSIS — E039 Hypothyroidism, unspecified: Secondary | ICD-10-CM | POA: Insufficient documentation

## 2020-09-29 DIAGNOSIS — Z888 Allergy status to other drugs, medicaments and biological substances status: Secondary | ICD-10-CM | POA: Diagnosis not present

## 2020-09-29 DIAGNOSIS — K640 First degree hemorrhoids: Secondary | ICD-10-CM | POA: Diagnosis not present

## 2020-09-29 DIAGNOSIS — Z79899 Other long term (current) drug therapy: Secondary | ICD-10-CM | POA: Diagnosis not present

## 2020-09-29 DIAGNOSIS — Z1211 Encounter for screening for malignant neoplasm of colon: Secondary | ICD-10-CM | POA: Diagnosis not present

## 2020-09-29 DIAGNOSIS — Z01818 Encounter for other preprocedural examination: Secondary | ICD-10-CM

## 2020-09-29 DIAGNOSIS — Z0000 Encounter for general adult medical examination without abnormal findings: Secondary | ICD-10-CM

## 2020-09-29 HISTORY — PX: COLONOSCOPY: SHX5424

## 2020-09-29 SURGERY — COLONOSCOPY
Anesthesia: General | Site: Rectum

## 2020-09-29 MED ORDER — LIDOCAINE HCL (CARDIAC) PF 100 MG/5ML IV SOSY
PREFILLED_SYRINGE | INTRAVENOUS | Status: DC | PRN
  Administered 2020-09-29: 30 mg via INTRAVENOUS

## 2020-09-29 MED ORDER — ONDANSETRON HCL 4 MG/2ML IJ SOLN: 4.0000 mg | Freq: Once | INTRAMUSCULAR | Status: DC | PRN

## 2020-09-29 MED ORDER — LACTATED RINGERS IV SOLN: INTRAVENOUS | Status: DC

## 2020-09-29 MED ORDER — ACETAMINOPHEN 10 MG/ML IV SOLN: 1000.0000 mg | Freq: Once | INTRAVENOUS | Status: DC | PRN

## 2020-09-29 MED ORDER — STERILE WATER FOR IRRIGATION IR SOLN: Status: DC | PRN

## 2020-09-29 MED ORDER — PROPOFOL 10 MG/ML IV BOLUS
INTRAVENOUS | Status: DC | PRN
  Administered 2020-09-29 (×2): 50 mg via INTRAVENOUS
  Administered 2020-09-29: 150 mg via INTRAVENOUS
  Administered 2020-09-29: 30 mg via INTRAVENOUS

## 2020-09-29 MED ORDER — SODIUM CHLORIDE 0.9 % IV SOLN: INTRAVENOUS | Status: DC

## 2020-09-29 SURGICAL SUPPLY — 6 items
GOWN CVR UNV OPN BCK APRN NK (MISCELLANEOUS) ×2 IMPLANT
GOWN ISOL THUMB LOOP REG UNIV (MISCELLANEOUS) ×4
KIT PRC NS LF DISP ENDO (KITS) ×1 IMPLANT
KIT PROCEDURE OLYMPUS (KITS) ×2
MANIFOLD NEPTUNE II (INSTRUMENTS) ×2 IMPLANT
WATER STERILE IRR 250ML POUR (IV SOLUTION) ×2 IMPLANT

## 2020-09-29 NOTE — Anesthesia Procedure Notes (Signed)
Date/Time: 09/29/2020 12:15 PM Performed by: Maree Krabbe, CRNA Pre-anesthesia Checklist: Patient identified, Emergency Drugs available, Suction available, Timeout performed and Patient being monitored Patient Re-evaluated:Patient Re-evaluated prior to induction Oxygen Delivery Method: Nasal cannula Placement Confirmation: positive ETCO2

## 2020-09-29 NOTE — Transfer of Care (Signed)
Immediate Anesthesia Transfer of Care Note  Patient: Brittany Sanchez  Procedure(s) Performed: COLONOSCOPY (Rectum)  Patient Location: PACU  Anesthesia Type: General  Level of Consciousness: awake, alert  and patient cooperative  Airway and Oxygen Therapy: Patient Spontanous Breathing and Patient connected to supplemental oxygen  Post-op Assessment: Post-op Vital signs reviewed, Patient's Cardiovascular Status Stable, Respiratory Function Stable, Patent Airway and No signs of Nausea or vomiting  Post-op Vital Signs: Reviewed and stable  Complications: No notable events documented.

## 2020-09-29 NOTE — Anesthesia Postprocedure Evaluation (Signed)
Anesthesia Post Note  Patient: Brittany Sanchez  Procedure(s) Performed: COLONOSCOPY (Rectum)     Patient location during evaluation: PACU Anesthesia Type: General Level of consciousness: awake and alert Pain management: pain level controlled Vital Signs Assessment: post-procedure vital signs reviewed and stable Respiratory status: spontaneous breathing, nonlabored ventilation, respiratory function stable and patient connected to nasal cannula oxygen Cardiovascular status: blood pressure returned to baseline and stable Postop Assessment: no apparent nausea or vomiting Anesthetic complications: no   No notable events documented.  Shivali Quackenbush A  Jamey Harman

## 2020-09-29 NOTE — H&P (Signed)
Brittany Lame, MD [facility] [address], [place], [place] [address] Phone: [phone] Fax : [phone]  Primary Care Physician:  Lesleigh Noe, MD Primary Gastroenterologist:  Dr. Allen Norris  Pre-Procedure History & Physical: HPI:  Brittany Sanchez is a 50 y.o. female is here for a screening colonoscopy.   Past Medical History:  Diagnosis Date   Hypothyroidism    Lyme disease    PMS (premenstrual syndrome)     Past Surgical History:  Procedure Laterality Date   AUGMENTATION MAMMAPLASTY Bilateral 1998   BREAST BIOPSY Right 04/06/2017   affirm bx coil marker, path pending   BREAST ENHANCEMENT SURGERY     ENDOMETRIAL ABLATION     SHOULDER ARTHROSCOPY WITH LABRAL REPAIR Right 01/19/2017   Procedure: SHOULDER ARTHROSCOPY WITH LABRAL REPAIR, SLAP REPAIR;  Surgeon: Thornton Park, MD;  Location: [facility] ORS;  Service: Orthopedics;  Laterality: Right;   SHOULDER ARTHROSCOPY WITH LABRAL REPAIR Right 05/03/2019   Procedure: SHOULDER ARTHROSCOPY WITH LABRAL REPAIR & Capsulorrhaphy;  Surgeon: Thornton Park, MD;  Location: [facility] ORS;  Service: Orthopedics;  Laterality: Right;    Prior to Admission medications   Medication Sig Start Date End Date Taking? Authorizing Provider  FLUoxetine (PROZAC) 40 MG capsule Take 40 mg by mouth at bedtime. 04/04/19  Yes [provider]  ibuprofen (ADVIL) 200 MG tablet Take 600 mg by mouth every 6 (six) hours as needed.   Yes [provider]  Melatonin 10 MG TABS Take by mouth at bedtime as needed.   Yes [provider]  Multiple Vitamin (MULTIVITAMIN WITH MINERALS) TABS tablet Take 1 tablet by mouth daily.   Yes [provider]  progesterone (PROMETRIUM) 100 MG capsule TAKE 2 CAPSULES (200 MG TOTAL) BY MOUTH DAILY. Patient taking differently: Take 200 mg by mouth at bedtime. 10/11/18  Yes Shambley, Melody N, CNM  thyroid (ARMOUR) 15 MG tablet Take 1 tablet (15 mg total) by mouth daily. Take 1qd with 73m to equal 777m total daily 02/21/18  Yes Shambley, Melody N, CNM  thyroid (ARMOUR) 60 MG tablet Take 1 tablet (60 mg total) by mouth daily before breakfast. Take 1qd with 1576mo equal 38m41mtal daily 02/21/18  Yes Shambley, Melody N, CNM  traZODone (DESYREL) 50 MG tablet Take 0.5 tablets (25 mg total) by mouth as needed. Patient taking differently: Take 50 mg by mouth at bedtime. 04/05/18  Yes Shambley, Melody N, CNM  mupirocin ointment (BACTROBAN) 2 % Apply 1 application topically daily. With dressing changes 12/06/19   KowaRalene Bathe  Na Sulfate-K Sulfate-Mg Sulf (SUPREP BOWEL PREP KIT) 17.5-3.13-1.6 GM/177ML SOLN Take 1 kit by mouth as directed. 08/28/20   AnnaJonathon Bellows  naltrexone (DEPADE) 50 MG tablet Take 25 mg by mouth daily. For neuropathies. Takes at night Patient not taking: Reported on 09/29/2020    [provider]    Allergies as of 08/28/2020 - Review Complete 12/07/2019  Allergen Reaction Noted   Seasonal ic [cholestatin] Other (See Comments) 04/23/2019    Family History  Problem Relation Age of Onset   Colon cancer Mother 57  25   colon cancer   Ovarian cancer Mother    Sleep apnea Brother    Alcohol abuse Other    Depression Other    Cancer Father 50  45   mesothelioma   Cancer Maternal Grandmother 60  69   ? ovarian   Diabetes Maternal Grandfather    Breast cancer Neg Hx  Social History   Socioeconomic History   Marital status: Married    Spouse name: stephen   Number of children: 2   Years of education: Master's degree   Highest education level: Not on file  Occupational History   Occupation: teaches high school    Employer: [employer]  Tobacco Use   Smoking status: Never   Smokeless tobacco: Never  Vaping Use   Vaping Use: Never used  Substance and Sexual Activity   Alcohol use: Yes    Alcohol/week: 0.0 standard drinks    Comment: 2 drinks per week   Drug use: No   Sexual activity: Yes    Partners: Male    Birth  control/protection: Rhythm  Other Topics Concern   Not on file  Social History Narrative      08/13/19   From: [place] originally but came to [facility] for college   Living: with husband Annie Main (2000) and 1 child   Work: Pharmacist, 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, clinical (histocompatibility and immunogenetics)      Family: Engineer, drilling (adult) and Reagan (2006)      Enjoys: golf      Exercise: 3-4 times a week   Diet: good       Safety   Seat belts: Yes    Guns: No   Safe in relationships: Yes    Social Determinants of Radio broadcast assistant Strain: Not on file  Food Insecurity: Not on file  Transportation Needs: Not on file  Physical Activity: Not on file  Stress: Not on file  Social Connections: Not on file  Intimate Partner Violence: Not on file    Review of Systems: See HPI, otherwise negative ROS  Physical Exam: BP 117/76   Pulse (!) 59   Temp 97.6 F (36.4 C) (Temporal)   Ht _0  (1.6 m)   Wt 65.3 kg   SpO2 97%   BMI 25.51 kg/m  General:   Alert,  pleasant and cooperative in NAD Head:  Normocephalic and atraumatic. Neck:  Supple; no masses or thyromegaly. Lungs:  Clear throughout to auscultation.    Heart:  Regular rate and rhythm. Abdomen:  Soft, nontender and nondistended. Normal bowel sounds, without guarding, and without rebound.   Neurologic:  Alert and  oriented x4;  grossly normal neurologically.  Impression/Plan: Brittany Sanchez is now here to undergo a screening colonoscopy.  Risks, benefits, and alternatives regarding colonoscopy have been reviewed with the patient.  Questions have been answered.  All parties agreeable.

## 2020-09-29 NOTE — Op Note (Signed)
[facility] Patient Name: Brittany Sanchez Procedure Date: 09/29/2020 12:10 PM MRN: [id number] Account #: [id number] Date of Birth: Dec 07, 1970 Admit Type: Outpatient Age: 50 Room: [facility] OR ROOM 01 Gender: Female Note Status: Finalized Procedure:             Colonoscopy Indications:           Screening for colorectal malignant neoplasm Providers:             Midge Minium MD, MD Referring MD:          Chryl Heck. Selena Batten (Referring MD) Medicines:             Propofol per Anesthesia Complications:         No immediate complications. Procedure:             Pre-Anesthesia Assessment:                        - Prior to the procedure, a History and Physical was                         performed, and patient medications and allergies were                         reviewed. The patient's tolerance of previous                         anesthesia was also reviewed. The risks and benefits                         of the procedure and the sedation options and risks                         were discussed with the patient. All questions were                         answered, and informed consent was obtained. Prior                         Anticoagulants: The patient has taken no previous                         anticoagulant or antiplatelet agents. ASA Grade                         Assessment: II - A patient with mild systemic disease.                         After reviewing the risks and benefits, the patient                         was deemed in satisfactory condition to undergo the                         procedure.                        After obtaining informed consent, the colonoscope was  passed under direct vision. Throughout the procedure,                         the patient's blood pressure, pulse, and oxygen                         saturations were monitored continuously. The                         Colonoscope was introduced through the  anus and                         advanced to the the cecum, identified by appendiceal                         orifice and ileocecal valve. The colonoscopy was                         performed without difficulty. The patient tolerated                         the procedure well. The quality of the bowel                         preparation was excellent. Findings:      The perianal and digital rectal examinations were normal.      Non-bleeding internal hemorrhoids were found during retroflexion. The       hemorrhoids were Grade I (internal hemorrhoids that do not prolapse). Impression:            - Non-bleeding internal hemorrhoids.                        - No specimens collected. Recommendation:        - Discharge patient to home.                        - Resume previous diet.                        - Continue present medications.                        - Repeat colonoscopy in 10 years for screening                         purposes. Procedure Code(s):     --- Professional ---                        [phone], Colonoscopy, flexible; diagnostic, including                         collection of specimen(s) by brushing or washing, when                         performed (separate procedure) Diagnosis Code(s):     --- Professional ---                        Z12.11, Encounter for screening for malignant neoplasm  of colon CPT copyright 2019 American Medical Association. All rights reserved. The codes documented in this report are preliminary and upon coder review may  be revised to meet current compliance requirements. Midge Minium MD, MD 09/29/2020 18:33:58 PM This report has been signed electronically. Number of Addenda: 0 Note Initiated On: 09/29/2020 12:10 PM Scope Withdrawal Time: 0 hours 7 minutes 18 seconds  Total Procedure Duration: 0 hours 11 minutes 38 seconds  Estimated Blood Loss:  Estimated blood loss: none.      [facility]

## 2020-09-29 NOTE — Anesthesia Preprocedure Evaluation (Signed)
Anesthesia Evaluation  Patient identified by MRN, date of birth, ID band Patient awake    Reviewed: Allergy & Precautions, NPO status , Patient's Chart, lab work & pertinent test results, reviewed documented beta blocker date and time   History of Anesthesia Complications Negative for: history of anesthetic complications  Airway Mallampati: II  TM Distance: >3 FB Neck ROM: Full    Dental   Pulmonary    breath sounds clear to auscultation       Cardiovascular (-) angina(-) DOE  Rhythm:Regular Rate:Normal     Neuro/Psych PSYCHIATRIC DISORDERS Anxiety Depression    GI/Hepatic neg GERD  ,  Endo/Other  Hypothyroidism   Renal/GU      Musculoskeletal   Abdominal   Peds  Hematology   Anesthesia Other Findings   Reproductive/Obstetrics                             Anesthesia Physical Anesthesia Plan  ASA: 2  Anesthesia Plan: General   Post-op Pain Management:    Induction: Intravenous  PONV Risk Score and Plan: 3 and Propofol infusion, TIVA and Treatment may vary due to age or medical condition  Airway Management Planned: Natural Airway and Nasal Cannula  Additional Equipment:   Intra-op Plan:   Post-operative Plan:   Informed Consent: I have reviewed the patients History and Physical, chart, labs and discussed the procedure including the risks, benefits and alternatives for the proposed anesthesia with the patient or authorized representative who has indicated his/her understanding and acceptance.       Plan Discussed with: CRNA and Anesthesiologist  Anesthesia Plan Comments:         Anesthesia Quick Evaluation

## 2020-09-30 ENCOUNTER — Encounter: Payer: Self-pay | Admitting: Gastroenterology

## 2020-10-22 DIAGNOSIS — N951 Menopausal and female climacteric states: Secondary | ICD-10-CM | POA: Diagnosis not present

## 2020-10-22 DIAGNOSIS — G629 Polyneuropathy, unspecified: Secondary | ICD-10-CM | POA: Diagnosis not present

## 2020-10-22 DIAGNOSIS — E039 Hypothyroidism, unspecified: Secondary | ICD-10-CM | POA: Diagnosis not present

## 2020-10-22 DIAGNOSIS — A6920 Lyme disease, unspecified: Secondary | ICD-10-CM | POA: Diagnosis not present

## 2020-10-22 DIAGNOSIS — N959 Unspecified menopausal and perimenopausal disorder: Secondary | ICD-10-CM | POA: Diagnosis not present

## 2020-10-22 DIAGNOSIS — Z13220 Encounter for screening for lipoid disorders: Secondary | ICD-10-CM | POA: Diagnosis not present

## 2020-10-22 DIAGNOSIS — Z7409 Other reduced mobility: Secondary | ICD-10-CM | POA: Diagnosis not present

## 2020-10-22 DIAGNOSIS — E7210 Disorders of sulfur-bearing amino-acid metabolism, unspecified: Secondary | ICD-10-CM | POA: Diagnosis not present

## 2020-10-22 DIAGNOSIS — Z0001 Encounter for general adult medical examination with abnormal findings: Secondary | ICD-10-CM | POA: Diagnosis not present

## 2020-10-22 DIAGNOSIS — E559 Vitamin D deficiency, unspecified: Secondary | ICD-10-CM | POA: Diagnosis not present

## 2020-10-22 DIAGNOSIS — E538 Deficiency of other specified B group vitamins: Secondary | ICD-10-CM | POA: Diagnosis not present

## 2020-10-22 DIAGNOSIS — G4700 Insomnia, unspecified: Secondary | ICD-10-CM | POA: Diagnosis not present

## 2021-02-01 DIAGNOSIS — R001 Bradycardia, unspecified: Secondary | ICD-10-CM

## 2021-02-01 HISTORY — DX: Bradycardia, unspecified: R00.1

## 2021-04-15 ENCOUNTER — Other Ambulatory Visit: Payer: Self-pay

## 2021-04-15 ENCOUNTER — Ambulatory Visit: Payer: BC Managed Care – PPO | Admitting: Family

## 2021-04-15 ENCOUNTER — Encounter: Payer: Self-pay | Admitting: Family

## 2021-04-15 VITALS — BP 109/68 | HR 64 | Temp 97.6°F | Ht 62.5 in | Wt 143.0 lb

## 2021-04-15 DIAGNOSIS — R051 Acute cough: Secondary | ICD-10-CM | POA: Insufficient documentation

## 2021-04-15 DIAGNOSIS — J0110 Acute frontal sinusitis, unspecified: Secondary | ICD-10-CM | POA: Diagnosis not present

## 2021-04-15 MED ORDER — GUAIFENESIN-DM 100-10 MG/5ML PO SYRP: 5.0000 mL | ORAL_SOLUTION | ORAL | 0 refills | Status: DC | PRN

## 2021-04-15 MED ORDER — AMOXICILLIN-POT CLAVULANATE 875-125 MG PO TABS: 1.0000 | ORAL_TABLET | Freq: Two times a day (BID) | ORAL | 0 refills | Status: DC

## 2021-04-15 MED ORDER — METHYLPREDNISOLONE 4 MG PO TBPK
ORAL_TABLET | ORAL | 0 refills | Status: DC
Start: 2021-04-15 — End: 2021-10-30

## 2021-04-15 NOTE — Patient Instructions (Signed)
Start prescription of augmentin 875/125 mg and take as prescribed.  Tylenol/ibuprofen ok for sinus pain as needed Increase oral fluids. Ok to continue with humidifers and hot steamy showers as discussed during visit.  It was a pleasure speaking with you today, I hope you start feeling better soon.  Regards,   Rodell Marrs  

## 2021-04-15 NOTE — Assessment & Plan Note (Signed)
Prescription given for augmentin 875/125 mg po bid for ten days. Pt to continue tylenol/ibuprofen prn sinus pain. Continue with humidifier prn and steam showers recommended as well. instructed If no symptom improvement in 48 hours please f/u ? ?

## 2021-04-15 NOTE — Progress Notes (Signed)
? ?Established Patient Office Visit ? ?Subjective:  ?Patient ID: Brittany Sanchez, female    DOB: 1970/08/25  Age: 51 y.o. MRN: [id number] ? ?CC:  ?Chief Complaint  ?Patient presents with  ? Cough  ?  Pt stated--coughing, back ache, eye socket, --last Wednesday.  ? ? ?HPI ?Brittany Sanchez is here today with concerns.  ? ?Started with symptoms seven days ago. Hurts behind her eyes, sinus pressure,  ?No ear pain or sore throat. No fever. Some chills on and off.  ?Chest congestion, coughing a lot with no real productive of sputum. Getting dizzy with coughing spells and back hurts from coughing so much.  ? ?Drinking a lot of water, so increased frequency of urine.  ? ?Taking thera-flu which helps slightly.  ?Taking hydrocodone homatropine with some relief as well.  ?Does have some back pain from coughing so much.  ? ?Past Medical History:  ?Diagnosis Date  ? Hypothyroidism   ? Lyme disease   ? PMS (premenstrual syndrome)   ? ? ?Past Surgical History:  ?Procedure Laterality Date  ? AUGMENTATION MAMMAPLASTY Bilateral 1998  ? BREAST BIOPSY Right 04/06/2017  ? affirm bx coil marker, path pending  ? BREAST ENHANCEMENT SURGERY    ? COLONOSCOPY N/A 09/29/2020  ? Procedure: COLONOSCOPY;  Surgeon: Lucilla Lame, MD;  Location: [place];  Service: Endoscopy;  Laterality: N/A;  ? ENDOMETRIAL ABLATION    ? SHOULDER ARTHROSCOPY WITH LABRAL REPAIR Right 01/19/2017  ? Procedure: SHOULDER ARTHROSCOPY WITH LABRAL REPAIR, SLAP REPAIR;  Surgeon: Thornton Park, MD;  Location: [facility];  Service: Orthopedics;  Laterality: Right;  ? SHOULDER ARTHROSCOPY WITH LABRAL REPAIR Right 05/03/2019  ? Procedure: SHOULDER ARTHROSCOPY WITH LABRAL REPAIR & Capsulorrhaphy;  Surgeon: Thornton Park, MD;  Location: [facility];  Service: Orthopedics;  Laterality: Right;  ? ? ?Family History  ?Problem Relation Age of Onset  ? Colon cancer Mother 46  ?     colon cancer  ? Ovarian cancer Mother   ? Sleep apnea Brother   ? Alcohol abuse Other   ? Depression  Other   ? Cancer Father 54  ?     mesothelioma  ? Cancer Maternal Grandmother 29  ?     ? ovarian  ? Diabetes Maternal Grandfather   ? Breast cancer Neg Hx   ? ? ?Social History  ? ?Socioeconomic History  ? Marital status: Married  ?  Spouse name: stephen  ? Number of children: 2  ? Years of education: Master's degree  ? Highest education level: Not on file  ?Occupational History  ? Occupation: teaches high school  ?  Employer: [employer]  ?Tobacco Use  ? Smoking status: Never  ? Smokeless tobacco: Never  ?Vaping Use  ? Vaping Use: Never used  ?Substance and Sexual Activity  ? Alcohol use: Yes  ?  Alcohol/week: 0.0 standard drinks  ?  Comment: 2 drinks per week  ? Drug use: No  ? Sexual activity: Yes  ?  Partners: Male  ?  Birth control/protection: Rhythm  ?Other Topics Concern  ? Not on file  ?Social History Narrative  ?   ? 08/13/19  ? From: [place] originally but came to [facility] [employer] [facility] for college  ? Living: with husband Annie Main (2000) and 1 child  ? 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   ? Family: Izola Price (adult) and Reagan (2006)  ?   ? Enjoys: golf  ?   ? Exercise: 3-4 times a week  ?  Diet: good   ?   ? Safety  ? Seat belts: Yes   ? Guns: No  ? Safe in relationships: Yes   ? ?Social Determinants of Health  ? ?Financial Resource Strain: Not on file  ?Food Insecurity: Not on file  ?Transportation Needs: Not on file  ?Physical Activity: Not on file  ?Stress: Not on file  ?Social Connections: Not on file  ?Intimate Partner Violence: Not on file  ? ? ?Outpatient Medications Prior to Visit  ?Medication Sig Dispense Refill  ? FLUoxetine (PROZAC) 40 MG capsule Take 40 mg by mouth at bedtime.    ? ibuprofen (ADVIL) 200 MG tablet Take 600 mg by mouth every 6 (six) hours as needed.    ? Melatonin 10 MG TABS Take by mouth at bedtime as needed.    ? Multiple Vitamin (MULTIVITAMIN WITH MINERALS) TABS tablet Take 1 tablet by mouth daily.    ? mupirocin ointment (BACTROBAN) 2 % Apply 1  application topically daily. With dressing changes 22 g 0  ? Na Sulfate-K Sulfate-Mg Sulf (SUPREP BOWEL PREP KIT) 17.5-3.13-1.6 GM/177ML SOLN Take 1 kit by mouth as directed. 354 mL 0  ? naltrexone (DEPADE) 50 MG tablet Take 25 mg by mouth daily. For neuropathies. Takes at night    ? progesterone (PROMETRIUM) 100 MG capsule TAKE 2 CAPSULES (200 MG TOTAL) BY MOUTH DAILY. (Patient taking differently: Take 200 mg by mouth at bedtime.) 180 capsule 3  ? thyroid (ARMOUR) 15 MG tablet Take 1 tablet (15 mg total) by mouth daily. Take 1qd with 61m to equal 747mtotal daily 30 tablet 4  ? thyroid (ARMOUR) 60 MG tablet Take 1 tablet (60 mg total) by mouth daily before breakfast. Take 1qd with 1569mo equal 82m34mtal daily 30 tablet 4  ? traZODone (DESYREL) 50 MG tablet Take 0.5 tablets (25 mg total) by mouth as needed. (Patient taking differently: Take 50 mg by mouth at bedtime.) 60 tablet 2  ? ?No facility-administered medications prior to visit.  ? ? ?Allergies  ?Allergen Reactions  ? Seasonal Ic [Cholestatin] Other (See Comments)  ?  NASAL SYMPTOMS, RUNNY NOSE  ? ? ?ROS ?Review of Systems  ?Constitutional:  Positive for chills and fatigue. Negative for fever.  ?HENT:  Positive for congestion, sinus pressure and sinus pain. Negative for ear pain and sore throat.   ?Respiratory:  Positive for cough (nonproductive) and chest tightness. Negative for shortness of breath and wheezing.   ?Cardiovascular:  Negative for chest pain and palpitations.  ? ?  ?Objective:  ?  ?Physical Exam ?Vitals reviewed.  ?Constitutional:   ?   General: She is not in acute distress. ?   Appearance: Normal appearance. She is not ill-appearing or toxic-appearing.  ?HENT:  ?   Right Ear: Tympanic membrane normal.  ?   Left Ear: Tympanic membrane normal.  ?   Nose:  ?   Right Sinus: Frontal sinus tenderness present.  ?   Left Sinus: Frontal sinus tenderness present.  ?   Mouth/Throat:  ?   Mouth: Mucous membranes are moist.  ?   Pharynx: No pharyngeal  swelling.  ?   Tonsils: No tonsillar exudate.  ?Eyes:  ?   Extraocular Movements: Extraocular movements intact.  ?   Conjunctiva/sclera: Conjunctivae normal.  ?   Pupils: Pupils are equal, round, and reactive to light.  ?Neck:  ?   Thyroid: No thyroid mass.  ?Cardiovascular:  ?   Rate and Rhythm: Normal rate and regular rhythm.  ?  Pulmonary:  ?   Effort: Pulmonary effort is normal.  ?   Breath sounds: Normal breath sounds.  ?Lymphadenopathy:  ?   Cervical:  ?   Right cervical: No superficial cervical adenopathy. ?   Left cervical: No superficial cervical adenopathy.  ?Neurological:  ?   General: No focal deficit present.  ?   Mental Status: She is alert and oriented to person, place, and time.  ?Psychiatric:     ?   Mood and Affect: Mood normal.     ?   Behavior: Behavior normal.     ?   Thought Content: Thought content normal.     ?   Judgment: Judgment normal.  ? ? ?BP 109/68   Pulse 64   Temp 97.6 ?F (36.4 ?C)   Ht 5' 2.5" (1.588 m)   Wt 143 lb (64.9 kg)   SpO2 99%   BMI 25.74 kg/m?  ?Wt Readings from Last 3 Encounters:  ?04/15/21 143 lb (64.9 kg)  ?09/29/20 144 lb (65.3 kg)  ?08/25/20 149 lb (67.6 kg)  ? ? ? ?Health Maintenance Due  ?Topic Date Due  ? COVID-19 Vaccine (1) Never done  ? HIV Screening  Never done  ? Hepatitis C Screening  Never done  ? MAMMOGRAM  12/03/2019  ? Zoster Vaccines- Shingrix (1 of 2) Never done  ? INFLUENZA VACCINE  Never done  ? ? ?There are no preventive care reminders to display for this patient. ? ?Lab Results  ?Component Value Date  ? TSH 1.180 10/19/2018  ? ?Lab Results  ?Component Value Date  ? WBC 8.3 05/01/2019  ? HGB 14.0 05/01/2019  ? HCT 41.7 05/01/2019  ? MCV 90.5 05/01/2019  ? PLT 239 05/01/2019  ? ?Lab Results  ?Component Value Date  ? NA 137 05/01/2019  ? K 3.8 05/01/2019  ? CO2 26 05/01/2019  ? GLUCOSE 87 05/01/2019  ? BUN 21 (H) 05/01/2019  ? CREATININE 1.07 (H) 05/01/2019  ? BILITOT <0.2 10/19/2018  ? ALKPHOS 46 10/19/2018  ? AST 26 10/19/2018  ? ALT 18  10/19/2018  ? PROT 6.1 10/19/2018  ? ALBUMIN 4.2 10/19/2018  ? CALCIUM 9.6 05/01/2019  ? ANIONGAP 10 05/01/2019  ? GFR 63.13 06/13/2014  ? ?Lab Results  ?Component Value Date  ? HGBA1C 5.5 10/19/2018  ? ? ?  ?Assessment

## 2021-04-15 NOTE — Assessment & Plan Note (Signed)
Hesitant to refill for hydrocodone homtrapine as pt taking naltrexone.  ?Will instead send RX for guaif-DM ?rx for medrol dose pack as well ?Continue with increase oral fluids ?

## 2021-07-31 DIAGNOSIS — M5416 Radiculopathy, lumbar region: Secondary | ICD-10-CM | POA: Diagnosis not present

## 2021-07-31 DIAGNOSIS — M9903 Segmental and somatic dysfunction of lumbar region: Secondary | ICD-10-CM | POA: Diagnosis not present

## 2021-07-31 DIAGNOSIS — M9902 Segmental and somatic dysfunction of thoracic region: Secondary | ICD-10-CM | POA: Diagnosis not present

## 2021-07-31 DIAGNOSIS — M546 Pain in thoracic spine: Secondary | ICD-10-CM | POA: Diagnosis not present

## 2021-08-03 ENCOUNTER — Ambulatory Visit: Payer: BC Managed Care – PPO | Admitting: Family

## 2021-08-06 DIAGNOSIS — M9902 Segmental and somatic dysfunction of thoracic region: Secondary | ICD-10-CM | POA: Diagnosis not present

## 2021-08-06 DIAGNOSIS — M5416 Radiculopathy, lumbar region: Secondary | ICD-10-CM | POA: Diagnosis not present

## 2021-08-06 DIAGNOSIS — M546 Pain in thoracic spine: Secondary | ICD-10-CM | POA: Diagnosis not present

## 2021-08-06 DIAGNOSIS — M9903 Segmental and somatic dysfunction of lumbar region: Secondary | ICD-10-CM | POA: Diagnosis not present

## 2021-08-21 DIAGNOSIS — M9903 Segmental and somatic dysfunction of lumbar region: Secondary | ICD-10-CM | POA: Diagnosis not present

## 2021-08-21 DIAGNOSIS — M5416 Radiculopathy, lumbar region: Secondary | ICD-10-CM | POA: Diagnosis not present

## 2021-08-21 DIAGNOSIS — M9902 Segmental and somatic dysfunction of thoracic region: Secondary | ICD-10-CM | POA: Diagnosis not present

## 2021-08-21 DIAGNOSIS — M546 Pain in thoracic spine: Secondary | ICD-10-CM | POA: Diagnosis not present

## 2021-08-26 DIAGNOSIS — M5416 Radiculopathy, lumbar region: Secondary | ICD-10-CM | POA: Diagnosis not present

## 2021-08-26 DIAGNOSIS — M9903 Segmental and somatic dysfunction of lumbar region: Secondary | ICD-10-CM | POA: Diagnosis not present

## 2021-08-26 DIAGNOSIS — M9902 Segmental and somatic dysfunction of thoracic region: Secondary | ICD-10-CM | POA: Diagnosis not present

## 2021-08-26 DIAGNOSIS — M546 Pain in thoracic spine: Secondary | ICD-10-CM | POA: Diagnosis not present

## 2021-10-22 ENCOUNTER — Telehealth: Payer: Self-pay | Admitting: Family Medicine

## 2021-10-22 ENCOUNTER — Other Ambulatory Visit: Payer: Self-pay | Admitting: Plastic Surgery

## 2021-10-22 ENCOUNTER — Other Ambulatory Visit: Payer: Self-pay | Admitting: Family Medicine

## 2021-10-22 DIAGNOSIS — Z1231 Encounter for screening mammogram for malignant neoplasm of breast: Secondary | ICD-10-CM

## 2021-10-22 NOTE — Telephone Encounter (Signed)
Patient called and asked if she can get a EKG done and other labs, she also asked for a call back. Call back number [phone].

## 2021-10-23 DIAGNOSIS — M9902 Segmental and somatic dysfunction of thoracic region: Secondary | ICD-10-CM | POA: Diagnosis not present

## 2021-10-23 DIAGNOSIS — M5416 Radiculopathy, lumbar region: Secondary | ICD-10-CM | POA: Diagnosis not present

## 2021-10-23 DIAGNOSIS — M9903 Segmental and somatic dysfunction of lumbar region: Secondary | ICD-10-CM | POA: Diagnosis not present

## 2021-10-23 DIAGNOSIS — M546 Pain in thoracic spine: Secondary | ICD-10-CM | POA: Diagnosis not present

## 2021-10-23 NOTE — Telephone Encounter (Signed)
Spoke to pt and she isn't have any symptoms for wanting this done, she just needs them done for sx clearance.

## 2021-10-23 NOTE — Telephone Encounter (Signed)
Scheduled appt for 11/26/21

## 2021-10-27 ENCOUNTER — Ambulatory Visit: Payer: BC Managed Care – PPO | Admitting: Family Medicine

## 2021-10-29 DIAGNOSIS — M9901 Segmental and somatic dysfunction of cervical region: Secondary | ICD-10-CM | POA: Diagnosis not present

## 2021-10-29 DIAGNOSIS — M9903 Segmental and somatic dysfunction of lumbar region: Secondary | ICD-10-CM | POA: Diagnosis not present

## 2021-10-29 DIAGNOSIS — M542 Cervicalgia: Secondary | ICD-10-CM | POA: Diagnosis not present

## 2021-10-29 DIAGNOSIS — M5459 Other low back pain: Secondary | ICD-10-CM | POA: Diagnosis not present

## 2021-10-30 ENCOUNTER — Encounter: Payer: BC Managed Care – PPO | Admitting: Family Medicine

## 2021-10-30 ENCOUNTER — Ambulatory Visit (INDEPENDENT_AMBULATORY_CARE_PROVIDER_SITE_OTHER): Payer: BC Managed Care – PPO | Admitting: Family Medicine

## 2021-10-30 VITALS — BP 100/62 | HR 54 | Temp 97.5°F | Ht 62.0 in | Wt 146.0 lb

## 2021-10-30 DIAGNOSIS — E538 Deficiency of other specified B group vitamins: Secondary | ICD-10-CM

## 2021-10-30 DIAGNOSIS — Z01818 Encounter for other preprocedural examination: Secondary | ICD-10-CM

## 2021-10-30 DIAGNOSIS — Z13220 Encounter for screening for lipoid disorders: Secondary | ICD-10-CM | POA: Diagnosis not present

## 2021-10-30 DIAGNOSIS — R350 Frequency of micturition: Secondary | ICD-10-CM | POA: Diagnosis not present

## 2021-10-30 DIAGNOSIS — F419 Anxiety disorder, unspecified: Secondary | ICD-10-CM

## 2021-10-30 DIAGNOSIS — M545 Low back pain, unspecified: Secondary | ICD-10-CM

## 2021-10-30 DIAGNOSIS — E559 Vitamin D deficiency, unspecified: Secondary | ICD-10-CM

## 2021-10-30 DIAGNOSIS — E039 Hypothyroidism, unspecified: Secondary | ICD-10-CM

## 2021-10-30 DIAGNOSIS — R9431 Abnormal electrocardiogram [ECG] [EKG]: Secondary | ICD-10-CM

## 2021-10-30 DIAGNOSIS — F32A Depression, unspecified: Secondary | ICD-10-CM

## 2021-10-30 LAB — CBC WITH DIFFERENTIAL/PLATELET
Basophils Absolute: 0 10*3/uL (ref 0.0–0.1)
Basophils Relative: 0.6 % (ref 0.0–3.0)
Eosinophils Absolute: 0.1 10*3/uL (ref 0.0–0.7)
Eosinophils Relative: 1.1 % (ref 0.0–5.0)
HCT: 39.7 % (ref 36.0–46.0)
Hemoglobin: 13.6 g/dL (ref 12.0–15.0)
Lymphocytes Relative: 28.8 % (ref 12.0–46.0)
Lymphs Abs: 1.8 10*3/uL (ref 0.7–4.0)
MCHC: 34.2 g/dL (ref 30.0–36.0)
MCV: 91.3 fl (ref 78.0–100.0)
Monocytes Absolute: 0.4 10*3/uL (ref 0.1–1.0)
Monocytes Relative: 6.5 % (ref 3.0–12.0)
Neutro Abs: 3.9 10*3/uL (ref 1.4–7.7)
Neutrophils Relative %: 63 % (ref 43.0–77.0)
Platelets: 233 10*3/uL (ref 150.0–400.0)
RBC: 4.35 Mil/uL (ref 3.87–5.11)
RDW: 12.9 % (ref 11.5–15.5)
WBC: 6.2 10*3/uL (ref 4.0–10.5)

## 2021-10-30 LAB — POC URINALSYSI DIPSTICK (AUTOMATED)
Bilirubin, UA: NEGATIVE
Blood, UA: NEGATIVE
Glucose, UA: NEGATIVE
Ketones, UA: NEGATIVE
Leukocytes, UA: NEGATIVE
Nitrite, UA: NEGATIVE
Protein, UA: NEGATIVE
Spec Grav, UA: 1.01 (ref 1.010–1.025)
Urobilinogen, UA: 0.2 E.U./dL
pH, UA: 6 (ref 5.0–8.0)

## 2021-10-30 LAB — LIPID PANEL
Cholesterol: 143 mg/dL (ref 0–200)
HDL: 53 mg/dL (ref >39.00)
LDL Cholesterol: 75 mg/dL (ref 0–99)
NonHDL: 90.44
Total CHOL/HDL Ratio: 3
Triglycerides: 77 mg/dL (ref 0.0–149.0)
VLDL: 15.4 mg/dL (ref 0.0–40.0)

## 2021-10-30 LAB — COMPREHENSIVE METABOLIC PANEL
ALT: 17 U/L (ref 0–35)
AST: 21 U/L (ref 0–37)
Albumin: 4.5 g/dL (ref 3.5–5.2)
Alkaline Phosphatase: 46 U/L (ref 39–117)
BUN: 18 mg/dL (ref 6–23)
CO2: 28 mEq/L (ref 19–32)
Calcium: 9.6 mg/dL (ref 8.4–10.5)
Chloride: 103 mEq/L (ref 96–112)
Creatinine, Ser: 1.09 mg/dL (ref 0.40–1.20)
GFR: 58.66 mL/min — ABNORMAL LOW (ref >60.00)
Glucose, Bld: 84 mg/dL (ref 70–99)
Potassium: 4.1 mEq/L (ref 3.5–5.1)
Sodium: 139 mEq/L (ref 135–145)
Total Bilirubin: 0.5 mg/dL (ref 0.2–1.2)
Total Protein: 6.6 g/dL (ref 6.0–8.3)

## 2021-10-30 LAB — APTT: aPTT: 36.7 s (ref 25.4–36.8)

## 2021-10-30 LAB — TSH: TSH: 0.3 u[IU]/mL — ABNORMAL LOW (ref 0.35–5.50)

## 2021-10-30 LAB — PROTIME-INR
INR: 0.9 ratio (ref 0.8–1.0)
Prothrombin Time: 10.5 s (ref 9.6–13.1)

## 2021-10-30 LAB — HEMOGLOBIN A1C: Hgb A1c MFr Bld: 5.7 % (ref 4.6–6.5)

## 2021-10-30 LAB — VITAMIN D 25 HYDROXY (VIT D DEFICIENCY, FRACTURES): VITD: 50.19 ng/mL (ref 30.00–100.00)

## 2021-10-30 LAB — VITAMIN B12: Vitamin B-12: 421 pg/mL (ref 211–911)

## 2021-10-30 MED ORDER — CYCLOBENZAPRINE HCL 5 MG PO TABS: 5.0000 mg | ORAL_TABLET | Freq: Three times a day (TID) | ORAL | 0 refills | Status: DC | PRN

## 2021-10-30 MED ORDER — FLUOXETINE HCL 40 MG PO CAPS: 40.0000 mg | ORAL_CAPSULE | Freq: Every day | ORAL | 1 refills | Status: DC

## 2021-10-30 NOTE — Assessment & Plan Note (Signed)
Bradycardia c/b dizziness. Referral to cardiology. Echo to evaluate possible left atrial enlargement

## 2021-10-30 NOTE — Assessment & Plan Note (Signed)
Doing well on prozac. Cont prozac 40 mg

## 2021-10-30 NOTE — Assessment & Plan Note (Signed)
Urine without signs of infection.  Reassurance provided.  Trial of muscle relaxant, continue ibuprofen and Tylenol as needed.

## 2021-10-30 NOTE — Patient Instructions (Signed)
#  low back pain - try tylenol and ibuprofen together - flexeril - muscle relaxant - may make you sleepy  Labs should be back next week I will have them fax the paperwork once reviewed

## 2021-10-30 NOTE — Assessment & Plan Note (Signed)
Follows with integrative medicine. Recheck thyroid

## 2021-10-30 NOTE — Progress Notes (Signed)
Subjective:    Brittany Sanchez is a 51 y.o. female who presents to the office today for a preoperative consultation at the request of surgeon Dr. Oneita Kras who plans on performing breast augmentation on December 5.   This consultation is requested for the specific conditions prompting preoperative evaluation (i.e. because of potential affect on operative risk): hypothyroidism. Planned anesthesia is general. The patient has the following known anesthesia issues:  none .    Patient has a bleeding risk of: no recent abnormal bleeding. Patient does have objections to receiving blood products if needed.  #urinary frequency - low back pain - x 1 week - no burning with urination - some radiating pain to the abdomen - urgency - no hesitancy - no blood in the urine  Respiratory Risk Factors:  Denies: Review of Systems  Constitutional:  Negative for chills and fever.  HENT:  Negative for congestion and sinus pain.   Respiratory:  Negative for cough, sputum production, shortness of breath and wheezing.   Cardiovascular:  Negative for chest pain and palpitations.    Social History   Tobacco Use   Smoking status: Never   Smokeless tobacco: Never  Substance Use Topics   Alcohol use: Yes    Alcohol/week: 0.0 standard drinks of alcohol    Comment: 2 drinks per week    If symptoms present: Consider pulmonary function testing or peak flow, CXR, ECG (>40 yo), hemoglobin, glucose (>45 yo)  Cardiac Risk Factors Age >40, other risks If presents - should obtain ECG. Further tests indicated if ECG with abnormalities   METs - Is able to complete exercise of 4 or more METs Yes > 4 METs: climbing 1 flight of stairs, mowing the lawn (walking), gardening, golfing w/o a cart, doubles tennis, swimming, riding a bike, square dancing, jogging   Patient is having a low Risk risk surgery.  High Risk surgery: emergency surgery, anticipated increased blood loss, aortic or peripheral vascular  surgery Intermediate Risk: Abdominal/thoracic, head and neck, carotid endarterectomy, orthopedic surgery, prostate surgery Low Risk: Breast surgery, cataract surgery, superficial surgery, endoscopy   The following portions of the patient's history were reviewed and updated as appropriate: allergies, current medications, past family history, past medical history, past social history, past surgical history, and problem list.  Review of Systems Pertinent items are noted in HPI.     Objective:    BP 100/62   Pulse (!) 54   Temp (!) 97.5 F (36.4 C) (Temporal)   Ht 5\' 2"  (1.575 m)   Wt 146 lb (66.2 kg)   SpO2 99%   BMI 26.70 kg/m   Physical Exam BP Readings from Last 3 Encounters:  10/30/21 100/62  04/15/21 109/68  09/29/20 95/74   Wt Readings from Last 3 Encounters:  10/30/21 146 lb (66.2 kg)  04/15/21 143 lb (64.9 kg)  09/29/20 144 lb (65.3 kg)    Physical Exam Constitutional:      General: She is not in acute distress.    Appearance: She is well-developed. She is not diaphoretic.  HENT:     Right Ear: External ear normal.     Left Ear: External ear normal.     Nose: Nose normal.  Eyes:     Conjunctiva/sclera: Conjunctivae normal.  Cardiovascular:     Rate and Rhythm: Normal rate and regular rhythm.     Heart sounds: No murmur heard. Pulmonary:     Effort: Pulmonary effort is normal. No respiratory distress.     Breath sounds:  Normal breath sounds. No wheezing or rales.  Abdominal:     General: Abdomen is flat. Bowel sounds are normal. There is no distension.     Palpations: Abdomen is soft.     Tenderness: There is abdominal tenderness (suprapubic). There is no right CVA tenderness, left CVA tenderness, guarding or rebound.  Musculoskeletal:     Cervical back: Neck supple.     Comments: Mild ttp along the lower back  Skin:    General: Skin is warm and dry.     Capillary Refill: Capillary refill takes less than 2 seconds.  Neurological:     Mental Status: She  is alert. Mental status is at baseline.  Psychiatric:        Mood and Affect: Mood normal.        Behavior: Behavior normal.     Cardiographics ECG: EKG with Sinus brady 48 bpm, enlarged p waves  Pending labs       Assessment:    51 y.o. female with planned surgery as above.   Known risk factors for perioperative complications: None   Difficulty with intubation is not anticipated.  Cardiac Risk Estimation: per the Revised Cardiac Risk Index (Circ. 100:1043, 1999), the patient's risk factors for cardiac complications include  none , putting her in: RCI RISK CLASS I (0 risk factors, risk of major cardiac compl. appr. 0.5%)  Major Clinical Predictors: Present ? No, if Yes ->obtain cardiology consult MI <6 weeks ago, unstable angina, decompensated CHF, significant arrhythmia causing hemodynamic instability, severe valvular disease  Intermediate Clinical Predictors: Present ? No, if Yes -> cardiology if METS <4 or high risk procedure, OK for surgery if METs>4 AND low or intermediate risk Mild angina pectoris, MI >6 weeks ago, compensated CHF, DM Minor clinical Predictors: Present ? Yes, if Yes -> cardiology if METS <4 AND high risk procedure, OK for surgery if METs>4 OR low or intermediate risk Advanced age, abnormal ECG, cardiac rhythm other than sinus, local functional capacity, hx of stroke, uncontrolled HTN  Current medications which may produce withdrawal symptoms if withheld perioperatively: none.      Plan:    Problem List Items Addressed This Visit       Endocrine   Hypothyroidism    Follows with integrative medicine. Recheck thyroid      Relevant Medications   NP THYROID 90 MG tablet   Other Relevant Orders   TSH     Other   Anxiety and depression    Doing well on prozac. Cont prozac 40 mg      Relevant Medications   FLUoxetine (PROZAC) 40 MG capsule   B12 deficiency   Relevant Orders   Vitamin B12   Vitamin D deficiency   Relevant Orders   Vitamin D,  25-hydroxy   Abnormal EKG    Bradycardia c/b dizziness. Referral to cardiology. Echo to evaluate possible left atrial enlargement      Relevant Orders   Ambulatory referral to Cardiology   ECHOCARDIOGRAM COMPLETE   Other Visit Diagnoses     Preoperative evaluation to rule out surgical contraindication    -  Primary   Relevant Orders   CBC with Differential   Comprehensive metabolic panel   Hemoglobin A1c   Protime-INR   APTT   EKG 12-Lead (Completed)   Urinary frequency       Relevant Orders   POCT Urinalysis Dipstick (Automated) (Completed)   Screening for hyperlipidemia       Relevant Orders   Lipid panel  Acute bilateral low back pain without sciatica       Relevant Medications   cyclobenzaprine (FLEXERIL) 5 MG tablet          1. Preoperative workup as follows ECG, hemoglobin, hematocrit, electrolytes, creatinine, liver function studies, coagulation studies. 2. Change in medication regimen before surgery: none, continue medication regimen including morning of surgery, with sip of water. 3. Prophylaxis for cardiac events with perioperative beta-blockers: not indicated. 4. Invasive hemodynamic monitoring perioperatively: not indicated. 5. Deep vein thrombosis prophylaxis postoperatively:regimen to be chosen by surgical team. 6. Surveillance for postoperative MI with ECG immediately postoperatively and on postoperative days 1 and 2 AND troponin levels 24 hours postoperatively and on day 4 or hospital discharge (whichever comes first): not indicated.   Patient with bradycardia, overall suspect she will be low risk will evaluate labs.  Do think it is reasonable to continue with surgery but have placed cardiology consult for evaluating the bradycardia  and will touch base with operating surgeon to discuss.   Lesleigh Noe, MD

## 2021-11-02 DIAGNOSIS — M542 Cervicalgia: Secondary | ICD-10-CM | POA: Diagnosis not present

## 2021-11-02 DIAGNOSIS — M9903 Segmental and somatic dysfunction of lumbar region: Secondary | ICD-10-CM | POA: Diagnosis not present

## 2021-11-02 DIAGNOSIS — M5459 Other low back pain: Secondary | ICD-10-CM | POA: Diagnosis not present

## 2021-11-02 DIAGNOSIS — M9901 Segmental and somatic dysfunction of cervical region: Secondary | ICD-10-CM | POA: Diagnosis not present

## 2021-11-03 DIAGNOSIS — M9901 Segmental and somatic dysfunction of cervical region: Secondary | ICD-10-CM | POA: Diagnosis not present

## 2021-11-03 DIAGNOSIS — M542 Cervicalgia: Secondary | ICD-10-CM | POA: Diagnosis not present

## 2021-11-03 DIAGNOSIS — M9903 Segmental and somatic dysfunction of lumbar region: Secondary | ICD-10-CM | POA: Diagnosis not present

## 2021-11-03 DIAGNOSIS — M5459 Other low back pain: Secondary | ICD-10-CM | POA: Diagnosis not present

## 2021-11-04 DIAGNOSIS — M533 Sacrococcygeal disorders, not elsewhere classified: Secondary | ICD-10-CM | POA: Diagnosis not present

## 2021-11-05 DIAGNOSIS — M5459 Other low back pain: Secondary | ICD-10-CM | POA: Diagnosis not present

## 2021-11-05 DIAGNOSIS — M9903 Segmental and somatic dysfunction of lumbar region: Secondary | ICD-10-CM | POA: Diagnosis not present

## 2021-11-05 DIAGNOSIS — M9901 Segmental and somatic dysfunction of cervical region: Secondary | ICD-10-CM | POA: Diagnosis not present

## 2021-11-05 DIAGNOSIS — M542 Cervicalgia: Secondary | ICD-10-CM | POA: Diagnosis not present

## 2021-11-06 ENCOUNTER — Telehealth: Payer: Self-pay | Admitting: Family Medicine

## 2021-11-06 DIAGNOSIS — M542 Cervicalgia: Secondary | ICD-10-CM | POA: Diagnosis not present

## 2021-11-06 DIAGNOSIS — M9903 Segmental and somatic dysfunction of lumbar region: Secondary | ICD-10-CM | POA: Diagnosis not present

## 2021-11-06 DIAGNOSIS — M5459 Other low back pain: Secondary | ICD-10-CM | POA: Diagnosis not present

## 2021-11-06 DIAGNOSIS — M9901 Segmental and somatic dysfunction of cervical region: Secondary | ICD-10-CM | POA: Diagnosis not present

## 2021-11-06 NOTE — Telephone Encounter (Signed)
Pt stated she seen her chiropractor today, 11/06/21 for her back pain. Pt stated she has a lot of inflammation in her back & the pt's chiropractor suggested that she request some type of steroid from her pcp. Pt is asking could something possibly be called in? Pt stated she could get a xray sent over to [facility] if she needs to bee it. Call back # [phone]

## 2021-11-06 NOTE — Telephone Encounter (Signed)
She needs an appointment to evaluate her back pain. Can see [facility] or other provider

## 2021-11-06 NOTE — Telephone Encounter (Signed)
Patient was scheduled to see Dr. Lorelei Pont on 11/09/2021 at 11:00 am.

## 2021-11-09 ENCOUNTER — Ambulatory Visit: Payer: BC Managed Care – PPO | Admitting: Family Medicine

## 2021-11-09 ENCOUNTER — Encounter: Payer: Self-pay | Admitting: Family Medicine

## 2021-11-09 VITALS — BP 90/70 | HR 64 | Temp 98.6°F | Ht 62.0 in | Wt 145.4 lb

## 2021-11-09 DIAGNOSIS — M9901 Segmental and somatic dysfunction of cervical region: Secondary | ICD-10-CM | POA: Diagnosis not present

## 2021-11-09 DIAGNOSIS — M5459 Other low back pain: Secondary | ICD-10-CM | POA: Diagnosis not present

## 2021-11-09 DIAGNOSIS — M5410 Radiculopathy, site unspecified: Secondary | ICD-10-CM

## 2021-11-09 DIAGNOSIS — M9903 Segmental and somatic dysfunction of lumbar region: Secondary | ICD-10-CM | POA: Diagnosis not present

## 2021-11-09 DIAGNOSIS — M542 Cervicalgia: Secondary | ICD-10-CM | POA: Diagnosis not present

## 2021-11-09 MED ORDER — PREDNISONE 20 MG PO TABS: ORAL_TABLET | ORAL | 0 refills | Status: DC

## 2021-11-09 NOTE — Progress Notes (Signed)
Brittany Sanchez T. Brittany Gallus, MD, [place] at [facility] [place], [address]  Phone: [phone]  FAX: [phone]  Brittany Sanchez - 51 y.o. female  MRN [id number]  Date of Birth: [birth year]  Date: 11/09/2021  PCP: Lesleigh Noe, MD  Referral: Lesleigh Noe, MD  Chief Complaint  Patient presents with   Back Pain    X 3 weeks.  Hurt while moving furniture   Subjective:   Brittany Sanchez is a 51 y.o. very pleasant female patient with Body mass index is 26.59 kg/m. who presents with the following:  Presents with some ongoing back pain:   Has had some muscle spasms for about a year, 3 weeks ago this developed into some relatively severe, acute back pain with right-sided radiculopathy.  Bad back pain.   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  She has been going to the chiropractor and has been getting some e-stim as well as some soft tissue work.  No manipulation.  She denies any focal numbness, she is not having any focal weakness. No bowel or bladder incontinence, she has no saddle anesthesia.  Review of Systems is noted in the HPI, as appropriate  Objective:   BP 90/70   Pulse 64   Temp 98.6 F (37 C) (Oral)   Ht 5\' 2"  (1.575 m)   Wt 145 lb 6 oz (65.9 kg)   SpO2 96%   BMI 26.59 kg/m    Range of motion at  the waist: Flexion, extension, lateral bending and rotation: I assessed the patient sitting down soma secondary to pain  No echymosis or edema Rises to examination table with mild difficulty Gait: minimally antalgic  Inspection/Deformity: N Paraspinus Tenderness: L4-S1, mild She is acutely tender in the upper posterior pelvic region on the posterior aspect on the right  B Ankle Dorsiflexion (L5,4): 5/5 B Great Toe Dorsiflexion (L5,4): 5/5 Ankle flexion and dorsiflexion is 5/5  SENSORY B Medial Foot (L4): WNL B Dorsum (L5): WNL B Lateral  (S1): WNL Light Touch: WNL Pinprick: WNL  REFLEXES Knee (L4): 2+ Ankle (S1): 2+  B SLR, seated: neg B SLR, supine: neg B FABER: Back pain B Reverse FABER: Pain B Greater Troch: NT B Log Roll: neg B Sciatic Notch: NT   Laboratory and Imaging Data:  Assessment and Plan:     ICD-10-CM   1. Back pain with right-sided radiculopathy  M54.10      Acute on chronic back pain with exacerbation in the setting of ongoing right-sided radicular pain.  Would suspect neuroforaminal encroachment on the right, most likely secondary to nucleus pulposus herniation.  I reviewed basic home rehab with her, she is going to do continued care with chiropractor, and I think there is a lot of carryover between this and physical therapy.  This seems reasonable.  Also going to pulse her with some steroids.  Medication Management during today's office visit: Meds ordered this encounter  Medications   predniSONE (DELTASONE) 20 MG tablet    Sig: 2 tabs po for 7 days, then 1 tab po for 7 days    Dispense:  21 tablet    Refill:  0   There are no discontinued medications.  Orders placed today for conditions managed today: No orders of the defined types were placed in this encounter.   Disposition: No follow-ups on file.  Dragon [employer] One speech-to-text software was  used for transcription in this dictation.  Possible transcriptional errors can occur using Editor, commissioning.   Signed,  Maud Deed. Dinora Hemm, MD   Outpatient Encounter Medications as of 11/09/2021  Medication Sig   cyclobenzaprine (FLEXERIL) 5 MG tablet Take 1 tablet (5 mg total) by mouth 3 (three) times daily as needed for muscle spasms.   FLUoxetine (PROZAC) 40 MG capsule Take 1 capsule (40 mg total) by mouth at bedtime.   ibuprofen (ADVIL) 200 MG tablet Take 600 mg by mouth every 6 (six) hours as needed.   Multiple Vitamin (MULTIVITAMIN WITH MINERALS) TABS tablet Take 1 tablet by mouth daily.   mupirocin ointment (BACTROBAN) 2 %  Apply 1 application topically daily. With dressing changes   NP THYROID 90 MG tablet Take 90 mg by mouth daily.   predniSONE (DELTASONE) 20 MG tablet 2 tabs po for 7 days, then 1 tab po for 7 days   progesterone (PROMETRIUM) 100 MG capsule TAKE 2 CAPSULES (200 MG TOTAL) BY MOUTH DAILY. (Patient taking differently: Take 200 mg by mouth at bedtime.)   traZODone (DESYREL) 50 MG tablet Take 0.5 tablets (25 mg total) by mouth as needed. (Patient taking differently: Take 50 mg by mouth at bedtime.)   No facility-administered encounter medications on file as of 11/09/2021.

## 2021-11-11 ENCOUNTER — Ambulatory Visit
Admission: RE | Admit: 2021-11-11 | Discharge: 2021-11-11 | Disposition: A | Discharge status: Home / Self Care | Payer: BC Managed Care – PPO | Source: Ambulatory Visit | Attending: Plastic Surgery | Admitting: Plastic Surgery

## 2021-11-11 DIAGNOSIS — M542 Cervicalgia: Secondary | ICD-10-CM | POA: Diagnosis not present

## 2021-11-11 DIAGNOSIS — M9903 Segmental and somatic dysfunction of lumbar region: Secondary | ICD-10-CM | POA: Diagnosis not present

## 2021-11-11 DIAGNOSIS — Z1231 Encounter for screening mammogram for malignant neoplasm of breast: Secondary | ICD-10-CM | POA: Diagnosis not present

## 2021-11-11 DIAGNOSIS — M5459 Other low back pain: Secondary | ICD-10-CM | POA: Diagnosis not present

## 2021-11-11 DIAGNOSIS — M9901 Segmental and somatic dysfunction of cervical region: Secondary | ICD-10-CM | POA: Diagnosis not present

## 2021-11-13 DIAGNOSIS — M9903 Segmental and somatic dysfunction of lumbar region: Secondary | ICD-10-CM | POA: Diagnosis not present

## 2021-11-13 DIAGNOSIS — M9901 Segmental and somatic dysfunction of cervical region: Secondary | ICD-10-CM | POA: Diagnosis not present

## 2021-11-13 DIAGNOSIS — M542 Cervicalgia: Secondary | ICD-10-CM | POA: Diagnosis not present

## 2021-11-13 DIAGNOSIS — M5459 Other low back pain: Secondary | ICD-10-CM | POA: Diagnosis not present

## 2021-11-16 DIAGNOSIS — M9903 Segmental and somatic dysfunction of lumbar region: Secondary | ICD-10-CM | POA: Diagnosis not present

## 2021-11-16 DIAGNOSIS — M5459 Other low back pain: Secondary | ICD-10-CM | POA: Diagnosis not present

## 2021-11-16 DIAGNOSIS — M9901 Segmental and somatic dysfunction of cervical region: Secondary | ICD-10-CM | POA: Diagnosis not present

## 2021-11-16 DIAGNOSIS — M542 Cervicalgia: Secondary | ICD-10-CM | POA: Diagnosis not present

## 2021-11-20 DIAGNOSIS — M9903 Segmental and somatic dysfunction of lumbar region: Secondary | ICD-10-CM | POA: Diagnosis not present

## 2021-11-20 DIAGNOSIS — M9901 Segmental and somatic dysfunction of cervical region: Secondary | ICD-10-CM | POA: Diagnosis not present

## 2021-11-20 DIAGNOSIS — M542 Cervicalgia: Secondary | ICD-10-CM | POA: Diagnosis not present

## 2021-11-20 DIAGNOSIS — M5459 Other low back pain: Secondary | ICD-10-CM | POA: Diagnosis not present

## 2021-11-23 DIAGNOSIS — M5459 Other low back pain: Secondary | ICD-10-CM | POA: Diagnosis not present

## 2021-11-23 DIAGNOSIS — M542 Cervicalgia: Secondary | ICD-10-CM | POA: Diagnosis not present

## 2021-11-23 DIAGNOSIS — M9901 Segmental and somatic dysfunction of cervical region: Secondary | ICD-10-CM | POA: Diagnosis not present

## 2021-11-23 DIAGNOSIS — M9903 Segmental and somatic dysfunction of lumbar region: Secondary | ICD-10-CM | POA: Diagnosis not present

## 2021-11-25 DIAGNOSIS — M542 Cervicalgia: Secondary | ICD-10-CM | POA: Diagnosis not present

## 2021-11-25 DIAGNOSIS — M5459 Other low back pain: Secondary | ICD-10-CM | POA: Diagnosis not present

## 2021-11-25 DIAGNOSIS — M9903 Segmental and somatic dysfunction of lumbar region: Secondary | ICD-10-CM | POA: Diagnosis not present

## 2021-11-25 DIAGNOSIS — M9901 Segmental and somatic dysfunction of cervical region: Secondary | ICD-10-CM | POA: Diagnosis not present

## 2021-11-27 DIAGNOSIS — M9901 Segmental and somatic dysfunction of cervical region: Secondary | ICD-10-CM | POA: Diagnosis not present

## 2021-11-27 DIAGNOSIS — M9903 Segmental and somatic dysfunction of lumbar region: Secondary | ICD-10-CM | POA: Diagnosis not present

## 2021-11-27 DIAGNOSIS — M542 Cervicalgia: Secondary | ICD-10-CM | POA: Diagnosis not present

## 2021-11-27 DIAGNOSIS — M5459 Other low back pain: Secondary | ICD-10-CM | POA: Diagnosis not present

## 2021-11-30 ENCOUNTER — Encounter (INDEPENDENT_AMBULATORY_CARE_PROVIDER_SITE_OTHER): Payer: Self-pay

## 2021-11-30 ENCOUNTER — Ambulatory Visit: Payer: BC Managed Care – PPO | Attending: Family Medicine

## 2021-11-30 DIAGNOSIS — R9431 Abnormal electrocardiogram [ECG] [EKG]: Secondary | ICD-10-CM

## 2021-11-30 LAB — ECHOCARDIOGRAM COMPLETE
AV Peak grad: 3.7 mmHg
Ao pk vel: 0.96 m/s
Area-P 1/2: 2.96 cm2
Calc EF: 61.5 %
S' Lateral: 2.8 cm
Single Plane A2C EF: 62.2 %
Single Plane A4C EF: 60 %

## 2021-12-01 DIAGNOSIS — M5459 Other low back pain: Secondary | ICD-10-CM | POA: Diagnosis not present

## 2021-12-01 DIAGNOSIS — M542 Cervicalgia: Secondary | ICD-10-CM | POA: Diagnosis not present

## 2021-12-01 DIAGNOSIS — M9903 Segmental and somatic dysfunction of lumbar region: Secondary | ICD-10-CM | POA: Diagnosis not present

## 2021-12-01 DIAGNOSIS — M9901 Segmental and somatic dysfunction of cervical region: Secondary | ICD-10-CM | POA: Diagnosis not present

## 2021-12-02 ENCOUNTER — Telehealth: Payer: Self-pay | Admitting: Family Medicine

## 2021-12-02 NOTE — Telephone Encounter (Signed)
Patient called in and stated she seen Dr. Lorelei Pont for back pain and was prescribed some prednisone. She is still having the pain and has an appointment with the back surgeon on the Nov. 8th. She was wondering if something else could be sent in for pain or if a referral for a MRI can be sent in or if she should just wait until she see the back surgeon. Please advise. Thank you!

## 2021-12-03 MED ORDER — CELECOXIB 200 MG PO CAPS: 200.0000 mg | ORAL_CAPSULE | Freq: Every day | ORAL | 0 refills | Status: DC

## 2021-12-03 MED ORDER — TIZANIDINE HCL 4 MG PO TABS: 4.0000 mg | ORAL_TABLET | Freq: Every evening | ORAL | 0 refills | Status: DC | PRN

## 2021-12-03 NOTE — Telephone Encounter (Signed)
[place] notified as instructed by telephone.  Patient states understanding.

## 2021-12-03 NOTE — Telephone Encounter (Signed)
OK.  No way I can get an MRI approved and done in a few days, so she should wait until she sees the surgeon to decide and manage.   I did send her in some Celebrex - strong anti-inflammatory, and Tizanidine - a muscle relaxer to use at night.  Hopefully, it will help some.

## 2021-12-03 NOTE — Addendum Note (Signed)
Addended by: Owens Loffler on: 12/03/2021 08:07 AM   Modules accepted: Orders

## 2021-12-08 DIAGNOSIS — M5442 Lumbago with sciatica, left side: Secondary | ICD-10-CM | POA: Diagnosis not present

## 2021-12-08 DIAGNOSIS — Z6825 Body mass index (BMI) 25.0-25.9, adult: Secondary | ICD-10-CM | POA: Diagnosis not present

## 2021-12-08 DIAGNOSIS — R1031 Right lower quadrant pain: Secondary | ICD-10-CM | POA: Diagnosis not present

## 2021-12-08 DIAGNOSIS — G8929 Other chronic pain: Secondary | ICD-10-CM | POA: Diagnosis not present

## 2021-12-10 ENCOUNTER — Telehealth: Payer: Self-pay | Admitting: Family Medicine

## 2021-12-10 NOTE — Telephone Encounter (Addendum)
I spoke with pt and she saw Dr Tressie Stalker on 12/08/21 and pt was given a prednisone dose pak with 4 mg tabs and on 12/08/21 pt took 6 pills; on 12/08/21 took 5 pills and today so far has taken 2 pills and is supposed to take 2 more pills today but pt said she has taken prednisone before and it did not bother her like this med has. Pt called Dr Lovell Sheehan office and got recording there was a power outage. I called Dr Lovell Sheehan office [phone] and spoke with [facility] and explained above info and pt is feeling anxious and jittery. Dawn asked for pt name, DOB and contact # [phone] and as soon as she finishes with pt she is with now she will call pt in few mins. Pt voiced understanding and given UC & ED precautions and pt voiced understanding and appreciative of call. Sending note to Emmaline Life FNP as Lorain Childes since no TOC has been done.    [facility] Day - Client TELEPHONE ADVICE RECORD AccessNurse Patient Name: Brittany Sanchez Gender: Female DOB: Nov 20, 1970 Age: 51 Y 8 D Return Phone Number: [phone] (Primary), [phone] (Secondary) Address: [address] Dt City/ State/ Zip: [place]  [address] Client [facility] Day - Client Client Site  [facility] - Day Provider Hannah Beat - MD Contact Type Call Who Is Calling Patient / Member / Family / Caregiver Call Type Triage / Clinical Relationship To Patient Self Return Phone Number [phone] (Primary) Chief Complaint Heart palpitations or irregular heartbeat Reason for Call Symptomatic / Request for Health Information Initial Comment Pt saw a doctor yesterday they provided a steroid and today her heart feels like it is racing and she is feeling anxious. Translation No Nurse Assessment Nurse: Annye English, RN, Denise Date/Time (Eastern Time): 12/10/2021 1:45:28 PM Confirm and document reason for call. If symptomatic, describe symptoms. ---Pt saw a doctor  yesterday they provided a steroid and today her heart feels like it is racing and she is feeling anxious. (Methylprednisalone dose pk) Does the patient have any new or worsening symptoms? ---Yes Will a triage be completed? ---Yes Related visit to physician within the last 2 weeks? ---Yes Does the PT have any chronic conditions? (i.e. diabetes, asthma, this includes High risk factors for pregnancy, etc.) ---No Is the patient pregnant or possibly pregnant? (Ask all females between the ages of 56-55) ---No Is this a behavioral health or substance abuse call? ---No Guidelines Guideline Title Affirmed Question Affirmed Notes Nurse Date/Time (Eastern Time) Heart Rate and Heartbeat Questions Dizziness, lightheadedness, or weakness Carmon, RN, Denise 12/10/2021 1:47:29 PM Disp. Time Lamount Cohen Time) Disposition Final User 12/10/2021 1:49:37 PM Go to ED Now Yes Carmon, RN, Angelique Blonder PLEASE NOTE: All timestamps contained within this report are represented as [place] Standard Time. CONFIDENTIALTY NOTICE: This fax transmission is intended only for the addressee. It contains information that is legally privileged, confidential or otherwise protected from use or disclosure. If you are not the intended recipient, you are strictly prohibited from reviewing, disclosing, copying using or disseminating any of this information or taking any action in reliance on or regarding this information. If you have received this fax in error, please notify us immediately by telephone so that we can arrange for its return to [place]. Phone: [phone], Toll-Free: [phone], Fax: [phone] Page: 2 of 2 Call Id: [id number] Final Disposition 12/10/2021 1:49:37 PM Go to ED Now Yes Annye English, RN, Leighton Ruff Disagree/Comply Disagree Caller Understands Yes  PreDisposition Call Doctor Care Advice Given Per Guideline GO TO ED NOW: NOTE TO TRIAGER - DRIVING: * Another adult should drive. BRING MEDICINES: * Bring a list of  your current medicines when you go to the Emergency Department (ER). CARE ADVICE given per Heart Rate and Heartbeat Questions (Adult) guideline. Referrals GO TO FACILITY REFUSE

## 2021-12-10 NOTE — Telephone Encounter (Signed)
Patient called and stated she seen a surgeon yesterday and was given a steroid and it giving her anxiety and her heart is racing. Patient was sent to triage.

## 2021-12-14 NOTE — Telephone Encounter (Signed)
I spoke with pt and Dr Lovell Sheehan office advised pt to stop taking the prednisone and pt said the anxiousness and fast heart beat went away. Pt said nothing further needed at this time. Pt appreciated call. FYI to Worthy Rancher FNP.

## 2021-12-17 ENCOUNTER — Encounter: Payer: Self-pay | Admitting: Cardiology

## 2021-12-17 ENCOUNTER — Ambulatory Visit: Payer: BC Managed Care – PPO | Attending: Cardiology | Admitting: Cardiology

## 2021-12-17 VITALS — BP 104/80 | HR 69 | Ht 62.0 in | Wt 140.4 lb

## 2021-12-17 DIAGNOSIS — R42 Dizziness and giddiness: Secondary | ICD-10-CM | POA: Diagnosis not present

## 2021-12-17 DIAGNOSIS — Z01810 Encounter for preprocedural cardiovascular examination: Secondary | ICD-10-CM

## 2021-12-17 DIAGNOSIS — R001 Bradycardia, unspecified: Secondary | ICD-10-CM

## 2021-12-17 DIAGNOSIS — M47816 Spondylosis without myelopathy or radiculopathy, lumbar region: Secondary | ICD-10-CM | POA: Diagnosis not present

## 2021-12-17 DIAGNOSIS — M5126 Other intervertebral disc displacement, lumbar region: Secondary | ICD-10-CM | POA: Diagnosis not present

## 2021-12-17 DIAGNOSIS — M5442 Lumbago with sciatica, left side: Secondary | ICD-10-CM | POA: Diagnosis not present

## 2021-12-17 NOTE — Patient Instructions (Signed)
Medication Instructions:   Your physician recommends that you continue on your current medications as directed. Please refer to the Current Medication list given to you today.  *If you need a refill on your cardiac medications before your next appointment, please call your pharmacy*    Follow-Up: At [facility], you and your health needs are our priority.  As part of our continuing mission to provide you with exceptional heart care, we have created designated Provider Care Teams.  These Care Teams include your primary Cardiologist (physician) and Advanced Practice Providers (APPs -  Physician Assistants and Nurse Practitioners) who all work together to provide you with the care you need, when you need it.  We recommend signing up for the patient portal called "MyChart".  Sign up information is provided on this After Visit Summary.  MyChart is used to connect with patients for Virtual Visits (Telemedicine).  Patients are able to view lab/test results, encounter notes, upcoming appointments, etc.  Non-urgent messages can be sent to your provider as well.   To learn more about what you can do with MyChart, go to [email or web address].    Your next appointment:   Follow up as needed   The format for your next appointment:   In Person  Provider:   Brian Agbor-Etang, MD    Other Instructions   Important Information About Sugar       

## 2021-12-17 NOTE — Progress Notes (Signed)
Cardiology Office Note:    Date:  12/17/2021   ID:  Brittany Sanchez, DOB 1970/09/27, MRN [id number]  PCP:  Gweneth Dimitri, MD   [facility] Providers Cardiologist:  None     Referring MD: Gweneth Dimitri, MD   Chief Complaint  Patient presents with   New Patient (Initial Visit)    Ref by Dr. Gweneth Dimitri for abnormal EKG. Patient c/o dizziness, weakness and fatigue.  Medications reviewed by the patient verbally.    Brittany Sanchez is a 51 y.o. female who is being seen today for the evaluation due to abnormal EKG at the request of Gweneth Dimitri, MD.   History of Present Illness:    Brittany Sanchez is a 51 y.o. female with a hx of hypothyroidism who presents due to abnormal ECG.  Routine ECG obtained by PCP 10/30/2021 showed sinus bradycardia heart rate 48, otherwise normal.  She denies chest pain or shortness of breath.  Her blood pressure tends to run low, associated with dizziness, ongoing since her college days.  She usually eats salty foods to help.  Denies palpitations.  Otherwise feels well, denies any cardiac risk factors smoking, diabetes, hypertension.  Denies any family history of heart disease.  She is planning a breast reconstructive surgery continued future.  Past Medical History:  Diagnosis Date   Hypothyroidism    Lyme disease    PMS (premenstrual syndrome)     Past Surgical History:  Procedure Laterality Date   AUGMENTATION MAMMAPLASTY Bilateral 1998   BREAST BIOPSY Right 04/06/2017   affirm bx coil marker,neg   BREAST ENHANCEMENT SURGERY     COLONOSCOPY N/A 09/29/2020   Procedure: COLONOSCOPY;  Surgeon: Midge Minium, MD;  [facility];  Service: Endoscopy;  Laterality: N/A;   ENDOMETRIAL ABLATION     SHOULDER ARTHROSCOPY WITH LABRAL REPAIR Right 01/19/2017   Procedure: SHOULDER ARTHROSCOPY WITH LABRAL REPAIR, SLAP REPAIR;  Surgeon: Juanell Fairly, MD;  Location: [facility];  Service: Orthopedics;  Laterality: Right;   SHOULDER ARTHROSCOPY  WITH LABRAL REPAIR Right 05/03/2019   Procedure: SHOULDER ARTHROSCOPY WITH LABRAL REPAIR & Capsulorrhaphy;  Surgeon: Juanell Fairly, MD;  Location: [facility];  Service: Orthopedics;  Laterality: Right;    Current Medications: Current Meds  Medication Sig   celecoxib (CELEBREX) 200 MG capsule Take 1 capsule (200 mg total) by mouth daily.   FLUoxetine (PROZAC) 40 MG capsule Take 1 capsule (40 mg total) by mouth at bedtime.   ibuprofen (ADVIL) 200 MG tablet Take 600 mg by mouth every 6 (six) hours as needed.   Multiple Vitamin (MULTIVITAMIN WITH MINERALS) TABS tablet Take 1 tablet by mouth daily.   mupirocin ointment (BACTROBAN) 2 % Apply 1 application topically daily. With dressing changes   NP THYROID 90 MG tablet Take 90 mg by mouth daily.   progesterone (PROMETRIUM) 100 MG capsule TAKE 2 CAPSULES (200 MG TOTAL) BY MOUTH DAILY. (Patient taking differently: Take 200 mg by mouth at bedtime.)   tiZANidine (ZANAFLEX) 4 MG tablet Take 1 tablet (4 mg total) by mouth at bedtime as needed for muscle spasms.   traZODone (DESYREL) 50 MG tablet Take 0.5 tablets (25 mg total) by mouth as needed. (Patient taking differently: Take 50 mg by mouth at bedtime.)     Allergies:   Seasonal ic [cholestatin]   Social History   Socioeconomic History   Marital status: Married    Spouse name: stephen   Number of children: 2   Years of education: Master's degree   Highest education  level: Not on file  Occupational History   Occupation: teaches high school    Employer: [employer]  Tobacco Use   Smoking status: Never   Smokeless tobacco: Never  Vaping Use   Vaping Use: Never used  Substance and Sexual Activity   Alcohol use: Yes    Alcohol/week: 0.0 standard drinks of alcohol    Comment: 2 drinks per week   Drug use: No   Sexual activity: Yes    Partners: Male    Birth control/protection: Rhythm  Other Topics Concern   Not on file  Social History Narrative      08/13/19    From: [place] originally but came to [employer] for college   Living: with husband Jeannett Senior (2000) and 1 child   Work: Runner, 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      Family: Electronics engineer (adult) and Arboriculturist (2006)      Enjoys: golf      Exercise: 3-4 times a week   Diet: good       Safety   Seat belts: Yes    Guns: No   Safe in relationships: Yes    Social Determinants of Corporate investment banker Strain: Not on file  Food Insecurity: Not on file  Transportation Needs: Not on file  Physical Activity: Not on file  Stress: Not on file  Social Connections: Not on file     Family History: The patient's family history includes Alcohol abuse in an other family member; Cancer (age of onset: 29) in her father; Cancer (age of onset: 50) in her maternal grandmother; Colon cancer (age of onset: 47) in her mother; Depression in an other family member; Diabetes in her maternal grandfather; Ovarian cancer in her mother; Sleep apnea in her brother. There is no history of Breast cancer.  ROS:   Please see the history of present illness.     All other systems reviewed and are negative.  EKGs/Labs/Other Studies Reviewed:    The following studies were reviewed today:   EKG:  EKG is  ordered today.  The ekg ordered today demonstrates normal sinus rhythm, normal ECG, heart rate 69  Recent Labs: 10/30/2021: ALT 17; BUN 18; Creatinine, Ser 1.09; Hemoglobin 13.6; Platelets 233.0; Potassium 4.1; Sodium 139; TSH 0.30  Recent Lipid Panel    Component Value Date/Time   CHOL 143 10/30/2021 1310   CHOL 152 10/19/2018 1639   TRIG 77.0 10/30/2021 1310   HDL 53.00 10/30/2021 1310   HDL 53 10/19/2018 1639   CHOLHDL 3 10/30/2021 1310   VLDL 15.4 10/30/2021 1310   LDLCALC 75 10/30/2021 1310   LDLCALC 74 10/19/2018 1639     Risk Assessment/Calculations:             Physical Exam:    VS:  BP 104/80 (BP Location: Left Arm, Patient Position: Sitting, Cuff Size: Normal)   Pulse 69   Ht 5\' 2"   (1.575 m)   Wt 140 lb 6 oz (63.7 kg)   SpO2 98%   BMI 25.67 kg/m     Wt Readings from Last 3 Encounters:  12/17/21 140 lb 6 oz (63.7 kg)  11/09/21 145 lb 6 oz (65.9 kg)  10/30/21 146 lb (66.2 kg)     GEN:  Well nourished, well developed in no acute distress HEENT: Normal NECK: No JVD; No carotid bruits CARDIAC: RRR, no murmurs, rubs, gallops RESPIRATORY:  Clear to auscultation without rales, wheezing or rhonchi  ABDOMEN: Soft, non-tender, non-distended MUSCULOSKELETAL:  No edema; No deformity  SKIN: Warm and dry NEUROLOGIC:  Alert and oriented x 3 PSYCHIATRIC:  Normal affect   ASSESSMENT:    1. Sinus bradycardia   2. Dizziness   3. Pre-operative cardiovascular examination    PLAN:    In order of problems listed above:  Sinus bradycardia noted on EKG from PCPs office.  EKG today showing sinus rhythm heart rate 69.  No additional testing required.  Continue to monitor. Dizziness in the setting of low blood pressures.  BP appears normal today.  Adequate hydration, salt tabs advised when BP runs low. Preop evaluation, breast reconstructive surgery being planned.  Patient is low risk from a cardiac perspective, asymptomatic, no risk factors.  Okay to proceed with procedure from a cardiac perspective.  Follow-up as needed      Medication Adjustments/Labs and Tests Ordered: Current medicines are reviewed at length with the patient today.  Concerns regarding medicines are outlined above.  Orders Placed This Encounter  Procedures   EKG 12-Lead   No orders of the defined types were placed in this encounter.   Patient Instructions  Medication Instructions:   Your physician recommends that you continue on your current medications as directed. Please refer to the Current Medication list given to you today.   *If you need a refill on your cardiac medications before your next appointment, please call your pharmacy*    Follow-Up: At [facility], you and your  health needs are our priority.  As part of our continuing mission to provide you with exceptional heart care, we have created designated Provider Care Teams.  These Care Teams include your primary Cardiologist (physician) and Advanced Practice Providers (APPs -  Physician Assistants and Nurse Practitioners) who all work together to provide you with the care you need, when you need it.  We recommend signing up for the patient portal called "MyChart".  Sign up information is provided on this After Visit Summary.  MyChart is used to connect with patients for Virtual Visits (Telemedicine).  Patients are able to view lab/test results, encounter notes, upcoming appointments, etc.  Non-urgent messages can be sent to your provider as well.   To learn more about what you can do with MyChart, go to [email or web address].    Your next appointment:   Follow up as needed   The format for your next appointment:   In Person  Provider:   Debbe Odea, MD    Other Instructions   Important Information About Sugar         Signed, Debbe Odea, MD  12/17/2021 9:34 AM    [facility]

## 2021-12-21 ENCOUNTER — Encounter: Payer: Self-pay | Admitting: Cardiology

## 2021-12-25 ENCOUNTER — Other Ambulatory Visit: Payer: Self-pay | Admitting: Family Medicine

## 2021-12-27 NOTE — Telephone Encounter (Signed)
Last office visit 11/09/21 for back pain.  Pharmacy is requesting 90 day supply on the tizanidine.  Last refilled 12/03/21 for #30 with no refills.  No future appointments.

## 2021-12-29 DIAGNOSIS — R1031 Right lower quadrant pain: Secondary | ICD-10-CM | POA: Diagnosis not present

## 2021-12-30 ENCOUNTER — Other Ambulatory Visit: Payer: Self-pay | Admitting: Family Medicine

## 2021-12-30 NOTE — Telephone Encounter (Signed)
Last office visit 11/09/21 for back pain with Dr. Patsy Lager.  Last refilled 12/03/21 for #30 with no refills  No future appointments.

## 2022-01-01 ENCOUNTER — Telehealth: Payer: Self-pay | Admitting: Cardiology

## 2022-01-01 NOTE — Telephone Encounter (Signed)
   Pre-operative Risk Assessment    Patient Name: Brittany Sanchez  DOB: [birth year] MRN: [id number]      Request for Surgical Clearance    Procedure:   fat injection/ both breast grafting. Lipo of abdomen - 4 hours  Date of Surgery:  Clearance 01/05/22                                 Surgeon:  Dr Arthur Holms Group or Practice Name:  [facility] Phone number:  [phone] Fax number:  [phone]   Type of Clearance Requested:   - Medical    Type of Anesthesia:  General    Additional requests/questions:    SignedNorman Herrlich   01/01/2022, 11:04 AM

## 2022-01-04 ENCOUNTER — Telehealth: Payer: Self-pay | Admitting: Cardiology

## 2022-01-04 DIAGNOSIS — M4725 Other spondylosis with radiculopathy, thoracolumbar region: Secondary | ICD-10-CM | POA: Diagnosis not present

## 2022-01-04 NOTE — Telephone Encounter (Signed)
Surgeon office called back and said fax not clear and wants a call back. I will fax the notes again to the surgeon office  as well as call them.    I s/w Raynelle Fanning and she stated the surgeon is fine with the clearance notes that were sent over.

## 2022-01-04 NOTE — Telephone Encounter (Signed)
   Patient Name: Brittany Sanchez  DOB: [birth year] MRN: [id number]  Primary Cardiologist: Dr. Azucena Cecil  Chart reviewed as part of pre-operative protocol coverage.   We received mychart msg from patient to ensure surgeon receives clearance as they had not gotten our office's note yet. Preop clearance was addressed in Dr. Merita Norton recent visit from 12/17/21. It looks like the preop APP also re-faxed a copy of the clearance note to requesting surgeon on 01/01/22. I have faxed again to the number below but callback, please verify with surgeon's office that they received this fax since patient states surgery will be cancelled if they do not get it and we have already tried to send it. If they did not get it, please verify a different fax number and send his note. Thank you!  Laurann [place], PA-C 01/04/2022, 12:08 PM

## 2022-01-04 NOTE — Telephone Encounter (Signed)
I s/w the surgeon office and they have now received the clearance notes. I was asked if the surgeon has a question can they call our office. I said yes that will be fine and they can ask to s/w the pre op provider.

## 2022-01-04 NOTE — Telephone Encounter (Signed)
Patient came by office to check on status  Informed patient that preop team is still currently working on making sure clearance is received  No further questions at this time

## 2022-01-04 NOTE — Telephone Encounter (Signed)
Patient was calling for update on clearance. Please advise

## 2022-01-04 NOTE — Telephone Encounter (Signed)
Raynelle Fanning from the surgery Center called in about the clearance info that was faxed over. She stated some it was not clear and requested to speak to preop Nurse  Best number is [phone]

## 2022-01-04 NOTE — Telephone Encounter (Signed)
See clearance notes  

## 2022-01-18 DIAGNOSIS — Z0001 Encounter for general adult medical examination with abnormal findings: Secondary | ICD-10-CM | POA: Diagnosis not present

## 2022-01-18 DIAGNOSIS — E7210 Disorders of sulfur-bearing amino-acid metabolism, unspecified: Secondary | ICD-10-CM | POA: Diagnosis not present

## 2022-01-18 DIAGNOSIS — R209 Unspecified disturbances of skin sensation: Secondary | ICD-10-CM | POA: Diagnosis not present

## 2022-01-18 DIAGNOSIS — G4700 Insomnia, unspecified: Secondary | ICD-10-CM | POA: Diagnosis not present

## 2022-01-18 DIAGNOSIS — E039 Hypothyroidism, unspecified: Secondary | ICD-10-CM | POA: Diagnosis not present

## 2022-01-18 DIAGNOSIS — E559 Vitamin D deficiency, unspecified: Secondary | ICD-10-CM | POA: Diagnosis not present

## 2022-01-18 DIAGNOSIS — E538 Deficiency of other specified B group vitamins: Secondary | ICD-10-CM | POA: Diagnosis not present

## 2022-01-18 DIAGNOSIS — A6920 Lyme disease, unspecified: Secondary | ICD-10-CM | POA: Diagnosis not present

## 2022-01-18 DIAGNOSIS — A449 Bartonellosis, unspecified: Secondary | ICD-10-CM | POA: Diagnosis not present

## 2022-01-19 DIAGNOSIS — R1031 Right lower quadrant pain: Secondary | ICD-10-CM | POA: Diagnosis not present

## 2022-01-19 DIAGNOSIS — Z7689 Persons encountering health services in other specified circumstances: Secondary | ICD-10-CM | POA: Diagnosis not present

## 2022-03-22 DIAGNOSIS — M25551 Pain in right hip: Secondary | ICD-10-CM | POA: Diagnosis not present

## 2022-03-24 DIAGNOSIS — M25551 Pain in right hip: Secondary | ICD-10-CM | POA: Insufficient documentation

## 2022-03-25 DIAGNOSIS — M1611 Unilateral primary osteoarthritis, right hip: Secondary | ICD-10-CM | POA: Diagnosis not present

## 2022-03-25 DIAGNOSIS — M25851 Other specified joint disorders, right hip: Secondary | ICD-10-CM | POA: Diagnosis not present

## 2022-03-28 DIAGNOSIS — M1611 Unilateral primary osteoarthritis, right hip: Secondary | ICD-10-CM | POA: Insufficient documentation

## 2022-03-28 DIAGNOSIS — M25851 Other specified joint disorders, right hip: Secondary | ICD-10-CM | POA: Insufficient documentation

## 2022-03-31 ENCOUNTER — Other Ambulatory Visit: Payer: Self-pay

## 2022-03-31 ENCOUNTER — Telehealth: Payer: Self-pay | Admitting: Nurse Practitioner

## 2022-03-31 DIAGNOSIS — F32A Depression, unspecified: Secondary | ICD-10-CM

## 2022-03-31 MED ORDER — FLUOXETINE HCL 40 MG PO CAPS: 40.0000 mg | ORAL_CAPSULE | Freq: Every day | ORAL | 0 refills | Status: DC

## 2022-03-31 NOTE — Telephone Encounter (Signed)
Got papers from [facility] for surgical clearance. She has appt with me on 04/05/20224 for the same. I am ok waiting that long as long as the patient is and the ortho is aware

## 2022-04-01 NOTE — Telephone Encounter (Signed)
Made a surgical clearance appt for 3/8 @ 11:40 But kept the TOC for 4/5.

## 2022-04-09 ENCOUNTER — Encounter: Payer: Self-pay | Admitting: Nurse Practitioner

## 2022-04-09 ENCOUNTER — Ambulatory Visit: Payer: BC Managed Care – PPO | Admitting: Nurse Practitioner

## 2022-04-09 VITALS — BP 100/60 | HR 58 | Temp 98.2°F | Resp 16 | Ht 62.0 in | Wt 138.5 lb

## 2022-04-09 DIAGNOSIS — R7303 Prediabetes: Secondary | ICD-10-CM | POA: Insufficient documentation

## 2022-04-09 DIAGNOSIS — M25551 Pain in right hip: Secondary | ICD-10-CM | POA: Insufficient documentation

## 2022-04-09 DIAGNOSIS — Z01818 Encounter for other preprocedural examination: Secondary | ICD-10-CM | POA: Diagnosis not present

## 2022-04-09 HISTORY — DX: Prediabetes: R73.03

## 2022-04-09 LAB — COMPREHENSIVE METABOLIC PANEL
ALT: 21 U/L (ref 0–35)
AST: 22 U/L (ref 0–37)
Albumin: 4.2 g/dL (ref 3.5–5.2)
Alkaline Phosphatase: 45 U/L (ref 39–117)
BUN: 24 mg/dL — ABNORMAL HIGH (ref 6–23)
CO2: 30 mEq/L (ref 19–32)
Calcium: 9.6 mg/dL (ref 8.4–10.5)
Chloride: 101 mEq/L (ref 96–112)
Creatinine, Ser: 1.01 mg/dL (ref 0.40–1.20)
GFR: 64.08 mL/min (ref >60.00)
Glucose, Bld: 82 mg/dL (ref 70–99)
Potassium: 3.8 mEq/L (ref 3.5–5.1)
Sodium: 138 mEq/L (ref 135–145)
Total Bilirubin: 0.3 mg/dL (ref 0.2–1.2)
Total Protein: 6.5 g/dL (ref 6.0–8.3)

## 2022-04-09 LAB — HEMOGLOBIN A1C: Hgb A1c MFr Bld: 5.4 % (ref 4.6–6.5)

## 2022-04-09 LAB — CBC
HCT: 42.1 % (ref 36.0–46.0)
Hemoglobin: 13.9 g/dL (ref 12.0–15.0)
MCHC: 33.1 g/dL (ref 30.0–36.0)
MCV: 93 fl (ref 78.0–100.0)
Platelets: 290 10*3/uL (ref 150.0–400.0)
RBC: 4.53 Mil/uL (ref 3.87–5.11)
RDW: 12.8 % (ref 11.5–15.5)
WBC: 11 10*3/uL — ABNORMAL HIGH (ref 4.0–10.5)

## 2022-04-09 NOTE — Assessment & Plan Note (Signed)
Last A1c of 5.7% we will recheck A1c today.  Pending result

## 2022-04-09 NOTE — Progress Notes (Signed)
Established Patient Office Visit  Subjective   Patient ID: Brittany Sanchez, female    DOB: [birth year]  Age: 52 y.o. MRN: [id number]  Chief Complaint  Patient presents with   Medical Clearance    HPI  Preoperative clearance: no trouble with anaesthesia in the psast. States that she does get some sick after the medicine.  No lung troubles States that she is doing the right total replacement  States that she has done PT and injections in the past.   No history or family history of malignant hyperthermia.  She does have a history of prediabetes  At her last office visit for presurgical clearance it was noted that she was bradycardic with symptoms she was sent to cardiology and evaluated by Dr. Kate Sable.  He did repeat an EKG which was within normal limits and sinus rhythm.  He needed no further testing for surgical clearance and patient handled the operation well  Patient will be undergoing right total hip replacement under the direction of Dr. Hector Shade at [facility].      Review of Systems  Constitutional:  Negative for chills and fever.  Respiratory:  Negative for shortness of breath.   Cardiovascular:  Negative for chest pain.  Gastrointestinal:  Negative for abdominal pain, constipation, diarrhea, nausea and vomiting.       Bm daily   Genitourinary:  Negative for dysuria and hematuria.  Musculoskeletal:  Positive for joint pain.  Neurological:  Positive for tingling (from lymes bilateral hands and feet).  Psychiatric/Behavioral:  Negative for hallucinations and suicidal ideas.       Objective:     BP 100/60   Pulse (!) 58   Temp 98.2 F (36.8 C)   Resp 16   Ht '5\' 2"'$  (1.575 m)   Wt 138 lb 8 oz (62.8 kg)   SpO2 97%   BMI 25.33 kg/m    Physical Exam Vitals and nursing note reviewed.  Constitutional:      Appearance: Normal appearance.  HENT:     Right Ear: Tympanic membrane, ear canal and external ear normal.     Left Ear: Tympanic membrane, ear  canal and external ear normal.     Mouth/Throat:     Mouth: Mucous membranes are moist.     Pharynx: Oropharynx is clear.  Eyes:     Pupils: Pupils are equal, round, and reactive to light.  Cardiovascular:     Rate and Rhythm: Normal rate and regular rhythm.     Heart sounds: Normal heart sounds.  Pulmonary:     Effort: Pulmonary effort is normal.     Breath sounds: Normal breath sounds.  Musculoskeletal:     Right lower leg: No edema.     Left lower leg: No edema.  Lymphadenopathy:     Cervical: No cervical adenopathy.  Skin:    General: Skin is warm.  Neurological:     General: No focal deficit present.     Mental Status: She is alert.     Deep Tendon Reflexes:     Reflex Scores:      Bicep reflexes are 2+ on the right side and 2+ on the left side.      Patellar reflexes are 2+ on the right side and 2+ on the left side.    Comments: Bilateral upper and lower extremity strength 5/5      No results found for any visits on 04/09/22.    The 10-year ASCVD risk score (Arnett DK, et al., 2019)  is: 0.6%    Assessment & Plan:   Problem List Items Addressed This Visit       Other   Preoperative evaluation to rule out surgical contraindication - Primary    Patient is a low risk surgical candidate for right hip replacement.  Pending labs patient will tentatively be cleared to have surgery      Relevant Orders   CBC   Comprehensive metabolic panel   Hemoglobin A1c   Right hip pain    Patient undergoing right hip replacement under the direction of Dr. Hector Shade.      Pre-diabetes    Last A1c of 5.7% we will recheck A1c today.  Pending result      Relevant Orders   Hemoglobin A1c    Return if symptoms worsen or fail to improve, for As scheduled .    Romilda Garret, NP

## 2022-04-09 NOTE — Assessment & Plan Note (Signed)
Patient is a low risk surgical candidate for right hip replacement.  Pending labs patient will tentatively be cleared to have surgery

## 2022-04-09 NOTE — Patient Instructions (Addendum)
Nice to see you today I will  be in touch with the labs once I have them Keep your transfer of care as scheduled

## 2022-04-09 NOTE — Assessment & Plan Note (Signed)
Patient undergoing right hip replacement under the direction of Dr. Hector Shade.

## 2022-04-13 ENCOUNTER — Encounter: Payer: Self-pay | Admitting: Nurse Practitioner

## 2022-04-13 ENCOUNTER — Telehealth: Payer: Self-pay | Admitting: Nurse Practitioner

## 2022-04-13 NOTE — Telephone Encounter (Signed)
Form copied, faxed and sent to scan.

## 2022-04-13 NOTE — Telephone Encounter (Signed)
Surgical clearance form completed and placed in box to be faxed

## 2022-04-22 ENCOUNTER — Ambulatory Visit: Payer: BC Managed Care – PPO | Admitting: Dermatology

## 2022-05-04 DIAGNOSIS — M25751 Osteophyte, right hip: Secondary | ICD-10-CM | POA: Diagnosis not present

## 2022-05-04 DIAGNOSIS — M1611 Unilateral primary osteoarthritis, right hip: Secondary | ICD-10-CM | POA: Diagnosis not present

## 2022-05-04 HISTORY — PX: TOTAL HIP ARTHROPLASTY: SHX124

## 2022-05-07 ENCOUNTER — Encounter: Payer: BC Managed Care – PPO | Admitting: Nurse Practitioner

## 2022-05-27 ENCOUNTER — Other Ambulatory Visit: Payer: Self-pay | Admitting: Family

## 2022-05-27 DIAGNOSIS — F419 Anxiety disorder, unspecified: Secondary | ICD-10-CM

## 2022-06-28 ENCOUNTER — Other Ambulatory Visit: Payer: Self-pay | Admitting: Family Medicine

## 2022-06-29 NOTE — Telephone Encounter (Signed)
Last office visit 04/09/2022 for medical clearance with [facility].  Last refilled 12/28/21 for #90 with 1 refill by Dr. Patsy Lager.  Next Appt: 07/05/2022 for TOC with [facility].

## 2022-06-30 ENCOUNTER — Ambulatory Visit: Payer: BC Managed Care – PPO | Admitting: Dermatology

## 2022-06-30 VITALS — BP 109/71 | HR 61

## 2022-06-30 DIAGNOSIS — L720 Epidermal cyst: Secondary | ICD-10-CM | POA: Diagnosis not present

## 2022-06-30 DIAGNOSIS — L729 Follicular cyst of the skin and subcutaneous tissue, unspecified: Secondary | ICD-10-CM

## 2022-06-30 DIAGNOSIS — Z7189 Other specified counseling: Secondary | ICD-10-CM | POA: Diagnosis not present

## 2022-06-30 NOTE — Progress Notes (Signed)
   Follow-Up Visit   Subjective  Brittany Sanchez is a 52 y.o. female who presents for the following: reports here today concerning a cyst at back that occasionally bleeds and has muscle spasms. Would like to discuss removal.    The following portions of the chart were reviewed this encounter and updated as appropriate: medications, allergies, medical history  Review of Systems:  No other skin or systemic complaints except as noted in HPI or Assessment and Plan.  Objective  Well appearing patient in no apparent distress; mood and affect are within normal limits.   A focused examination was performed of the following areas: back  Relevant exam findings are noted in the Assessment and Plan.    Assessment & Plan   EPIDERMAL INCLUSION CYST Exam: 1.2 cm subcutaneous nodule right low back below braline  Cyst with symptoms and/or recent change.  Discussed surgical excision to remove, including resulting scar and possible recurrence.  Patient will schedule for surgery. Pre-op information given.     No follow-ups on file.  IAsher Muir, CMA, am acting as scribe for Armida Sans, MD.   Documentation: I have reviewed the above documentation for accuracy and completeness, and I agree with the above.  Armida Sans, MD

## 2022-06-30 NOTE — Patient Instructions (Addendum)
 Pre-Operative Instructions  You are scheduled for a surgical procedure at [facility]. We recommend you read the following instructions. If you have any questions or concerns, please call the office at [phone].  Shower and wash the entire body with soap and water the day of your surgery paying special attention to cleansing at and around the planned surgery site.  Avoid aspirin or aspirin containing products at least fourteen (14) days prior to your surgical procedure and for at least one week (7 Days) after your surgical procedure. If you take aspirin on a regular basis for heart disease or history of stroke or for any other reason, we may recommend you continue taking aspirin but please notify us if you take this on a regular basis. Aspirin can cause more bleeding to occur during surgery as well as prolonged bleeding and bruising after surgery.   Avoid other nonsteroidal pain medications at least one week prior to surgery and at least one week prior to your surgery. These include medications such as Ibuprofen (Motrin, Advil and Nuprin), Naprosyn, Voltaren, Relafen, etc. If medications are used for therapeutic reasons, please inform us as they can cause increased bleeding or prolonged bleeding during and bruising after surgical procedures.   Please advise us if you are taking any "blood thinner" medications such as Coumadin or Dipyridamole or Plavix or similar medications. These cause increased bleeding and prolonged bleeding during procedures and bruising after surgical procedures. We may have to consider discontinuing these medications briefly prior to and shortly after your surgery if safe to do so.   Please inform us of all medications you are currently taking. All medications that are taken regularly should be taken the day of surgery as you always do. Nevertheless, we need to be informed of what medications you are taking prior to surgery to know whether they will affect the  procedure or cause any complications.   Please inform us of any medication allergies. Also inform us of whether you have allergies to Latex or rubber products or whether you have had any adverse reaction to Lidocaine or Epinephrine.  Please inform us of any prosthetic or artificial body parts such as artificial heart valve, joint replacements, etc., or similar condition that might require preoperative antibiotics.   We recommend avoidance of alcohol at least two weeks prior to surgery and continued avoidance for at least two weeks after surgery.   We recommend discontinuation of tobacco smoking at least two weeks prior to surgery and continued abstinence for at least two weeks after surgery.  Do not plan strenuous exercise, strenuous work or strenuous lifting for approximately four weeks after your surgery.   We request if you are unable to make your scheduled surgical appointment, please call us at least a week in advance or as soon as you are aware of a problem so that we can cancel or reschedule the appointment.   You MAY TAKE TYLENOL (acetaminophen) for pain as it is not a blood thinner.   PLEASE PLAN TO BE IN TOWN FOR TWO WEEKS FOLLOWING SURGERY, THIS IS IMPORTANT SO YOU CAN BE CHECKED FOR DRESSING CHANGES, SUTURE REMOVAL AND TO MONITOR FOR POSSIBLE COMPLICATIONS.    Due to recent changes in healthcare laws, you may see results of your pathology and/or laboratory studies on MyChart before the doctors have had a chance to review them. We understand that in some cases there may be results that are confusing or concerning to you. Please understand that not all results are   received at the same time and often the doctors may need to interpret multiple results in order to provide you with the best plan of care or course of treatment. Therefore, we ask that you please give us 2 business days to thoroughly review all your results before contacting the office for clarification. Should we see a  critical lab result, you will be contacted sooner.   If You Need Anything After Your Visit  If you have any questions or concerns for your doctor, please call our main line at [phone] and press option 4 to reach your doctor's medical assistant. If no one answers, please leave a voicemail as directed and we will return your call as soon as possible. Messages left after 4 pm will be answered the following business day.   You may also send us a message via MyChart. We typically respond to MyChart messages within 1-2 business days.  For prescription refills, please ask your pharmacy to contact our office. Our fax number is [phone].  If you have an urgent issue when the clinic is closed that cannot wait until the next business day, you can page your doctor at the number below.    Please note that while we do our best to be available for urgent issues outside of office hours, we are not available 24/7.   If you have an urgent issue and are unable to reach us, you may choose to seek medical care at your doctor's office, retail clinic, urgent care center, or emergency room.  If you have a medical emergency, please immediately call 911 or go to the emergency department.  Pager Numbers  - Dr. Kowalski: [phone]  - Dr. Moye: [phone]  - Dr. Stewart: [phone]  In the event of inclement weather, please call our main line at [phone] for an update on the status of any delays or closures.  Dermatology Medication Tips: Please keep the boxes that topical medications come in in order to help keep track of the instructions about where and how to use these. Pharmacies typically print the medication instructions only on the boxes and not directly on the medication tubes.   If your medication is too expensive, please contact our office at [phone] option 4 or send us a message through MyChart.   We are unable to tell what your co-pay for medications will be in advance as  this is different depending on your insurance coverage. However, we may be able to find a substitute medication at lower cost or fill out paperwork to get insurance to cover a needed medication.   If a prior authorization is required to get your medication covered by your insurance company, please allow us 1-2 business days to complete this process.  Drug prices often vary depending on where the prescription is filled and some pharmacies may offer cheaper prices.  The website www.goodrx.com contains coupons for medications through different pharmacies. The prices here do not account for what the cost may be with help from insurance (it may be cheaper with your insurance), but the website can give you the price if you did not use any insurance.  - You can print the associated coupon and take it with your prescription to the pharmacy.  - You may also stop by our office during regular business hours and pick up a GoodRx coupon card.  - If you need your prescription sent electronically to a different pharmacy, notify our office through [facility] MyChart or by phone at [phone] option 4.       Si Usted Necesita Algo Despus de Su Visita  Tambin puede enviarnos un mensaje a travs de MyChart. Por lo general respondemos a los mensajes de MyChart en el transcurso de 1 a 2 das hbiles.  Para renovar recetas, por favor pida a su farmacia que se ponga en contacto con nuestra oficina. Nuestro nmero de fax es el [phone].  Si tiene un asunto urgente cuando la clnica est cerrada y que no puede esperar hasta el siguiente da hbil, puede llamar/localizar a su doctor(a) al nmero que aparece a continuacin.   Por favor, tenga en cuenta que aunque hacemos todo lo posible para estar disponibles para asuntos urgentes fuera del horario de oficina, no estamos disponibles las 24 horas del da, los 7 das de la semana.   Si tiene un problema urgente y no puede comunicarse con nosotros, puede optar por  buscar atencin mdica  en el consultorio de su doctor(a), en una clnica privada, en un centro de atencin urgente o en una sala de emergencias.  Si tiene una emergencia mdica, por favor llame inmediatamente al 911 o vaya a la sala de emergencias.  Nmeros de bper  - Dr. Kowalski: [phone]  - Dra. Moye: [phone]  - Dra. Stewart: [phone]  En caso de inclemencias del tiempo, por favor llame a nuestra lnea principal al [phone] para una actualizacin sobre el estado de cualquier retraso o cierre.  Consejos para la medicacin en dermatologa: Por favor, guarde las cajas en las que vienen los medicamentos de uso tpico para ayudarle a seguir las instrucciones sobre dnde y cmo usarlos. Las farmacias generalmente imprimen las instrucciones del medicamento slo en las cajas y no directamente en los tubos del medicamento.   Si su medicamento es muy caro, por favor, pngase en contacto con nuestra oficina llamando al [phone] y presione la opcin 4 o envenos un mensaje a travs de MyChart.   No podemos decirle cul ser su copago por los medicamentos por adelantado ya que esto es diferente dependiendo de la cobertura de su seguro. Sin embargo, es posible que podamos encontrar un medicamento sustituto a menor costo o llenar un formulario para que el seguro cubra el medicamento que se considera necesario.   Si se requiere una autorizacin previa para que su compaa de seguros cubra su medicamento, por favor permtanos de 1 a 2 das hbiles para completar este proceso.  Los precios de los medicamentos varan con frecuencia dependiendo del lugar de dnde se surte la receta y alguna farmacias pueden ofrecer precios ms baratos.  El sitio web www.goodrx.com tiene cupones para medicamentos de diferentes farmacias. Los precios aqu no tienen en cuenta lo que podra costar con la ayuda del seguro (puede ser ms barato con su seguro), pero el sitio web puede darle el precio si no  utiliz ningn seguro.  - Puede imprimir el cupn correspondiente y llevarlo con su receta a la farmacia.  - Tambin puede pasar por nuestra oficina durante el horario de atencin regular y recoger una tarjeta de cupones de GoodRx.  - Si necesita que su receta se enve electrnicamente a una farmacia diferente, informe a nuestra oficina a travs de MyChart de [place] o por telfono llamando al [phone] y presione la opcin 4.  

## 2022-07-05 ENCOUNTER — Encounter: Payer: Self-pay | Admitting: Nurse Practitioner

## 2022-07-05 ENCOUNTER — Ambulatory Visit (INDEPENDENT_AMBULATORY_CARE_PROVIDER_SITE_OTHER): Payer: BC Managed Care – PPO | Admitting: Nurse Practitioner

## 2022-07-05 VITALS — BP 134/64 | HR 66 | Temp 98.0°F | Resp 16 | Ht 62.0 in | Wt 140.0 lb

## 2022-07-05 DIAGNOSIS — Z0000 Encounter for general adult medical examination without abnormal findings: Secondary | ICD-10-CM

## 2022-07-05 DIAGNOSIS — E538 Deficiency of other specified B group vitamins: Secondary | ICD-10-CM

## 2022-07-05 DIAGNOSIS — Z13220 Encounter for screening for lipoid disorders: Secondary | ICD-10-CM

## 2022-07-05 DIAGNOSIS — F419 Anxiety disorder, unspecified: Secondary | ICD-10-CM

## 2022-07-05 DIAGNOSIS — E039 Hypothyroidism, unspecified: Secondary | ICD-10-CM | POA: Diagnosis not present

## 2022-07-05 DIAGNOSIS — F32A Depression, unspecified: Secondary | ICD-10-CM

## 2022-07-05 DIAGNOSIS — E559 Vitamin D deficiency, unspecified: Secondary | ICD-10-CM

## 2022-07-05 DIAGNOSIS — G4700 Insomnia, unspecified: Secondary | ICD-10-CM

## 2022-07-05 LAB — LIPID PANEL
Cholesterol: 184 mg/dL (ref 0–200)
HDL: 58.3 mg/dL (ref >39.00)
LDL Cholesterol: 108 mg/dL — ABNORMAL HIGH (ref 0–99)
NonHDL: 125.75
Total CHOL/HDL Ratio: 3
Triglycerides: 87 mg/dL (ref 0.0–149.0)
VLDL: 17.4 mg/dL (ref 0.0–40.0)

## 2022-07-05 LAB — VITAMIN D 25 HYDROXY (VIT D DEFICIENCY, FRACTURES): VITD: 45.66 ng/mL (ref 30.00–100.00)

## 2022-07-05 LAB — VITAMIN B12: Vitamin B-12: 500 pg/mL (ref 211–911)

## 2022-07-05 MED ORDER — FLUOXETINE HCL 20 MG PO CAPS
20.0000 mg | ORAL_CAPSULE | Freq: Every day | ORAL | 3 refills | Status: DC
Start: 2022-07-05 — End: 2023-02-28

## 2022-07-05 NOTE — Assessment & Plan Note (Signed)
Managed by integrative medicine at [facility].  Patient on NP thyroid 90 mg a day.  Continue medication as prescribed follow-up with clinician as recommended

## 2022-07-05 NOTE — Assessment & Plan Note (Addendum)
History of the same patient was on fluoxetine 40 mg for this and PMDD.  She is been taking 40 every other day we will change prescription to fluoxetine 20 mg daily.  Patient does well and wants to titrate downshe will reach out to the office and we will help her in that

## 2022-07-05 NOTE — Progress Notes (Addendum)
Established Patient Office Visit  Subjective   Patient ID: Brittany Sanchez, female    DOB: July 31, 1970  Age: 52 y.o. MRN: [id number]  Chief Complaint  Patient presents with   Establish Care    Transferring from Dr. Selena Batten    HPI  Transfer of care: last seen by me on 04/09/2022 for pre op clearance for total right hip  Hypothyroidism: currently on Np thyroid and is [place] by [facility] [place]. States that he manges that. Also manges the [facility]. [facility]   Anxiety and depression: patient currently on prozac 40. Take every other day and would like to try the 20mg    for complete physical and follow up of chronic conditions.  Immunizations: -Tetanus: Completed in 2019 -Influenza: out of season  -Shingles: refused -Pneumonia: too young -covid:  refused   Diet: Fair diet. 6 small meals a day. water Exercise: No regular exercise. 3 times a week with cardio and weight at an hour at a time   Eye exam:  2 years ago and wears glasses  Dental exam: Completes semi-annually    Colonoscopy: Completed in 09/29/2020, repeat in 10 years  Lung Cancer Screening: NA  Pap smear: last year normal, per patient report. No history of abnormal's per patient report    Mammogram: 11/11/2021 normal with repeat in 1 year  Dexa:  too young   Sleep: 10-6 and traditoinally feels rested. States that she does have trazodone but tries not take it. States that she will use the muscle relaxer instead       Review of Systems  Constitutional:  Negative for chills and fever.  Respiratory:  Negative for shortness of breath.   Cardiovascular:  Negative for chest pain and leg swelling.  Gastrointestinal:  Negative for abdominal pain, blood in stool, constipation, diarrhea, nausea and vomiting.       Daily   Genitourinary:  Negative for dysuria and hematuria.  Neurological:  Negative for tingling and headaches.  Psychiatric/Behavioral:  Negative for hallucinations and suicidal  ideas.       Objective:     BP 134/64   Pulse 66   Temp 98 F (36.7 C)   Resp 16   Ht 5\' 2"  (1.575 m)   Wt 140 lb (63.5 kg)   SpO2 98%   BMI 25.61 kg/m  BP Readings from Last 3 Encounters:  07/05/22 134/64  06/30/22 109/71  04/09/22 100/60   Wt Readings from Last 3 Encounters:  07/05/22 140 lb (63.5 kg)  04/09/22 138 lb 8 oz (62.8 kg)  12/17/21 140 lb 6 oz (63.7 kg)      Physical Exam Vitals and nursing note reviewed.  Constitutional:      Appearance: Normal appearance.  HENT:     Right Ear: Tympanic membrane, ear canal and external ear normal.     Left Ear: Tympanic membrane, ear canal and external ear normal.     Mouth/Throat:     Mouth: Mucous membranes are moist.     Pharynx: Oropharynx is clear.  Eyes:     Extraocular Movements: Extraocular movements intact.     Pupils: Pupils are equal, round, and reactive to light.  Cardiovascular:     Rate and Rhythm: Normal rate and regular rhythm.     Pulses: Normal pulses.     Heart sounds: Normal heart sounds.  Pulmonary:     Effort: Pulmonary effort is normal.     Breath sounds: Normal breath sounds.  Abdominal:  General: Bowel sounds are normal. There is no distension.     Palpations: There is no mass.     Tenderness: There is no abdominal tenderness.     Hernia: No hernia is present.  Musculoskeletal:     Right lower leg: No edema.     Left lower leg: No edema.  Lymphadenopathy:     Cervical: No cervical adenopathy.  Skin:    General: Skin is warm.  Neurological:     General: No focal deficit present.     Mental Status: She is alert.     Deep Tendon Reflexes:     Reflex Scores:      Bicep reflexes are 2+ on the right side and 2+ on the left side.      Patellar reflexes are 2+ on the right side and 2+ on the left side.    Comments: Bilateral upper and lower extremity strength 5/5  Psychiatric:        Mood and Affect: Mood normal.        Behavior: Behavior normal.        Thought Content: Thought  content normal.        Judgment: Judgment normal.      No results found for any visits on 07/05/22.    The 10-year ASCVD risk score (Arnett DK, et al., 2019) is: 1.1%    Assessment & Plan:   Problem List Items Addressed This Visit       Endocrine   Hypothyroidism    Managed by integrative medicine at [facility].  Patient on NP thyroid 90 mg a day.  Continue medication as prescribed follow-up with clinician as recommended        Other   Anxiety and depression    History of the same patient was on fluoxetine 40 mg for this and PMDD.  She is been taking 40 every other day we will change prescription to fluoxetine 20 mg daily.  Patient does well and wants to titrate downshe will reach out to the office and we will help her in that      Relevant Medications   FLUoxetine (PROZAC) 20 MG capsule   B12 deficiency    History of the same pending B12 level      Relevant Orders   Vitamin B12   Insomnia    History of same currently managed on trazodone as needed.  Continue taking medication as prescribed      Vitamin D deficiency    History of the same.  Pending lab draw      Relevant Orders   VITAMIN D 25 Hydroxy (Vit-D Deficiency, Fractures)   Preventative health care - Primary    Discussed age-appropriate immunizations and screening exams.  Did review patient's personal, surgical, social, family histories.  Patient up-to-date with all vaccinations for her age range that she is willing to receive.  Patient up-to-date on CRC screening, cervical cancer screening, breast cancer screening.  Patient was given information at discharge about preventative healthcare maintenance with anticipatory guidance.      Other Visit Diagnoses     Screening for lipid disorders       Relevant Orders   Lipid panel       Return in about 1 year (around 07/05/2023) for CPE and Labs.    Audria Nine, NP

## 2022-07-05 NOTE — Assessment & Plan Note (Signed)
Discussed age-appropriate immunizations and screening exams.  Did review patient's personal, surgical, social, family histories.  Patient up-to-date with all vaccinations for her age range that she is willing to receive.  Patient up-to-date on CRC screening, cervical cancer screening, breast cancer screening.  Patient was given information at discharge about preventative healthcare maintenance with anticipatory guidance.

## 2022-07-05 NOTE — Assessment & Plan Note (Signed)
History of same currently managed on trazodone as needed.  Continue taking medication as prescribed

## 2022-07-05 NOTE — Patient Instructions (Signed)
Nice to see you today I will be in touch with the labs once I have them Follow up with me in 1 year, sooner if you need me   

## 2022-07-05 NOTE — Assessment & Plan Note (Signed)
History of the same.  Pending lab draw

## 2022-07-05 NOTE — Assessment & Plan Note (Signed)
History of the same pending B12 level

## 2022-07-05 NOTE — Addendum Note (Signed)
Addended by: Eden Emms on: 07/05/2022 12:58 PM   Modules accepted: Orders

## 2022-07-07 ENCOUNTER — Encounter: Payer: Self-pay | Admitting: Dermatology

## 2022-07-07 DIAGNOSIS — R262 Difficulty in walking, not elsewhere classified: Secondary | ICD-10-CM | POA: Diagnosis not present

## 2022-07-07 DIAGNOSIS — M25551 Pain in right hip: Secondary | ICD-10-CM | POA: Diagnosis not present

## 2022-07-12 DIAGNOSIS — R262 Difficulty in walking, not elsewhere classified: Secondary | ICD-10-CM | POA: Diagnosis not present

## 2022-07-12 DIAGNOSIS — M25551 Pain in right hip: Secondary | ICD-10-CM | POA: Diagnosis not present

## 2022-07-15 DIAGNOSIS — M25551 Pain in right hip: Secondary | ICD-10-CM | POA: Diagnosis not present

## 2022-07-15 DIAGNOSIS — R262 Difficulty in walking, not elsewhere classified: Secondary | ICD-10-CM | POA: Diagnosis not present

## 2022-07-20 ENCOUNTER — Encounter: Payer: Self-pay | Admitting: Dermatology

## 2022-07-20 ENCOUNTER — Ambulatory Visit (INDEPENDENT_AMBULATORY_CARE_PROVIDER_SITE_OTHER): Payer: BC Managed Care – PPO | Admitting: Dermatology

## 2022-07-20 VITALS — BP 90/60

## 2022-07-20 DIAGNOSIS — L720 Epidermal cyst: Secondary | ICD-10-CM | POA: Diagnosis not present

## 2022-07-20 DIAGNOSIS — D485 Neoplasm of uncertain behavior of skin: Secondary | ICD-10-CM

## 2022-07-20 MED ORDER — MUPIROCIN 2 % EX OINT: 1.0000 | TOPICAL_OINTMENT | Freq: Every day | CUTANEOUS | 0 refills | Status: DC

## 2022-07-20 NOTE — Patient Instructions (Signed)
Wound Care Instructions  On the day following your surgery, you should begin doing daily dressing changes: Remove the old dressing and discard it. Cleanse the wound gently with tap water. This may be done in the shower or by placing a wet gauze pad directly on the wound and letting it soak for several minutes. It is important to gently remove any dried blood from the wound in order to encourage healing. This may be done by gently rolling a moistened Q-tip on the dried blood. Do not pick at the wound. If the wound should start to bleed, continue cleaning the wound, then place a moist gauze pad on the wound and hold pressure for a few minutes.  Make sure you then dry the skin surrounding the wound completely or the tape will not stick to the skin. Do not use cotton balls on the wound. After the wound is clean and dry, apply the ointment gently with a Q-tip. Cut a non-stick pad to fit the size of the wound. Lay the pad flush to the wound. If the wound is draining, you may want to reinforce it with a small amount of gauze on top of the non-stick pad for a little added compression to the area. Use the tape to seal the area completely. Select from the following with respect to your individual situation: If your wound has been stitched closed: continue the above steps 1-8 at least daily until your sutures are removed. If your wound has been left open to heal: continue steps 1-8 at least daily for the first 3-4 weeks. We would like for you to take a few extra precautions for at least the next week. Sleep with your head elevated on pillows if our wound is on your head. Do not bend over or lift heavy items to reduce the chance of elevated blood pressure to the wound Do not participate in particularly strenuous activities.   Below is a list of dressing supplies you might need.  Cotton-tipped applicators - Q-tips Gauze pads (2x2 and/or 4x4) - All-Purpose Sponges Non-stick dressing material - Telfa Tape -  Paper or Hypafix New and clean tube of petroleum jelly - Vaseline    Comments on Post-Operative Period Slight swelling and redness often appear around the wound. This is normal and will disappear within several days following the surgery. The healing wound will drain a brownish-red-yellow discharge during healing. This is a normal phase of wound healing. As the wound begins to heal, the drainage may increase in amount. Again, this drainage is normal. Notify us if the drainage becomes persistently bloody, excessively swollen, or intensely painful or develops a foul odor or red streaks.  If you should experience mild discomfort during the healing phase, you may take an aspirin-free medication such as Tylenol (acetaminophen). Notify us if the discomfort is severe or persistent. Avoid alcoholic beverages when taking pain medicine.  In Case of Wound Hemorrhage A wound hemorrhage is when the bandage suddenly becomes soaked with bright red blood and flows profusely. If this happens, sit down or lie down with your head elevated. If the wound has a dressing on it, do not remove the dressing. Apply pressure to the existing gauze. If the wound is not covered, use a gauze pad to apply pressure and continue applying the pressure for 20 minutes without peeking. DO NOT COVER THE WOUND WITH A LARGE TOWEL OR WASH CLOTH. Release your hand from the wound site but do not remove the dressing. If the bleeding has stopped,   gently clean around the wound. Leave the dressing in place for 24 hours if possible. This wait time allows the blood vessels to close off so that you do not spark a new round of bleeding by disrupting the newly clotted blood vessels with an immediate dressing change. If the bleeding does not subside, continue to hold pressure. If matters are out of your control, contact an After Hours clinic or go to the Emergency Room.   

## 2022-07-20 NOTE — Progress Notes (Signed)
   Follow-Up Visit   Subjective  Brittany Sanchez is a 52 y.o. female who presents for the following: Excision of cyst vs other of right lower back below braline  The following portions of the chart were reviewed this encounter and updated as appropriate: medications, allergies, medical history  Review of Systems:  No other skin or systemic complaints except as noted in HPI or Assessment and Plan.  Objective  Well appearing patient in no apparent distress; mood and affect are within normal limits.  A focused examination was performed of the following areas: Back Relevant physical exam findings are noted in the Assessment and Plan.   Right Lower Back Below Braline Cystic papule 2.2 cm   Assessment & Plan   Neoplasm of uncertain behavior of skin -suspected growing symptomatic cyst Right Lower Back Below Braline  Skin excision  Lesion length (cm):  2.2 Lesion width (cm):  2.2 Margin per side (cm):  0 Total excision diameter (cm):  2.2 Informed consent: discussed and consent obtained   Timeout: patient name, date of birth, surgical site, and procedure verified   Procedure prep:  Patient was prepped and draped in usual sterile fashion Prep type:  Isopropyl alcohol and povidone-iodine Anesthesia: the lesion was anesthetized in a standard fashion   Anesthetic:  0.5% bupivicaine w/ epinephrine 1-100,000 local infiltration Instrument used: #15 blade   Hemostasis achieved with: pressure   Hemostasis achieved with comment:  Electrocautery Outcome: patient tolerated procedure well with no complications   Post-procedure details: sterile dressing applied and wound care instructions given   Dressing type: bandage and pressure dressing (mupirocin)    Skin repair Complexity:  Complex Final length (cm):  3 Reason for type of repair: reduce tension to allow closure, reduce the risk of dehiscence, infection, and necrosis, reduce subcutaneous dead space and avoid a hematoma, allow closure of  the large defect, preserve normal anatomy, preserve normal anatomical and functional relationships and enhance both functionality and cosmetic results   Undermining: area extensively undermined   Undermining comment:  Undermining defect 2.2 cm Subcutaneous layers (deep stitches):  Suture size:  3-0 Suture type: Vicryl (polyglactin 910)   Subcutaneous suture technique: inverted dermal. Fine/surface layer approximation (top stitches):  Suture size:  3-0 Suture type: nylon   Stitches: simple running   Suture removal (days):  7 Hemostasis achieved with: suture and pressure Outcome: patient tolerated procedure well with no complications   Post-procedure details: sterile dressing applied and wound care instructions given   Dressing type: bandage and pressure dressing (mupirocin)    mupirocin ointment (BACTROBAN) 2 % Apply 1 Application topically daily. With dressing changes  Specimen 1 - Surgical pathology Differential Diagnosis: Cyst vs other   Check Margins: No    Return in about 1 week (around 07/27/2022) for suture removal.  I, Joanie Coddington, CMA, am acting as scribe for Armida Sans, MD .  Documentation: I have reviewed the above documentation for accuracy and completeness, and I agree with the above.  Armida Sans, MD

## 2022-07-27 ENCOUNTER — Ambulatory Visit (INDEPENDENT_AMBULATORY_CARE_PROVIDER_SITE_OTHER): Payer: BC Managed Care – PPO | Admitting: Dermatology

## 2022-07-27 VITALS — BP 92/62

## 2022-07-27 DIAGNOSIS — L729 Follicular cyst of the skin and subcutaneous tissue, unspecified: Secondary | ICD-10-CM

## 2022-07-27 NOTE — Progress Notes (Unsigned)
   Follow-Up Visit   Subjective  Brittany Sanchez is a 52 y.o. female who presents for the following: Cyst bx proven,R lower back below braline 1 wk post op  The following portions of the chart were reviewed this encounter and updated as appropriate: medications, allergies, medical history  Review of Systems:  No other skin or systemic complaints except as noted in HPI or Assessment and Plan.  Objective  Well appearing patient in no apparent distress; mood and affect are within normal limits.  A focused examination was performed of the following areas: back  Relevant exam findings are noted in the Assessment and Plan.   Assessment & Plan   EPIDERMAL INCLUSION CYST bx proven Exam: healing excision site R lower back below braline  Treatment:  Encounter for Removal of Sutures - Incision site at the right lower back below braline is clean, dry and intact - Wound cleansed, sutures removed, wound cleansed and steri strips applied.  - Discussed pathology results showing Cyst  - Patient advised to keep steri-strips dry until they fall off. - Scars remodel for a full year. - Once steri-strips fall off, patient can apply over-the-counter silicone scar cream each night to help with scar remodeling if desired. - Patient advised to call with any concerns or if they notice any new or changing lesions.    Return if symptoms worsen or fail to improve.  I, Ardis Rowan, RMA, am acting as scribe for Armida Sans, MD .   Documentation: I have reviewed the above documentation for accuracy and completeness, and I agree with the above.  Armida Sans, MD

## 2022-07-27 NOTE — Patient Instructions (Signed)
Due to recent changes in healthcare laws, you may see results of your pathology and/or laboratory studies on MyChart before the doctors have had a chance to review them. We understand that in some cases there may be results that are confusing or concerning to you. Please understand that not all results are received at the same time and often the doctors may need to interpret multiple results in order to provide you with the best plan of care or course of treatment. Therefore, we ask that you please give us 2 business days to thoroughly review all your results before contacting the office for clarification. Should we see a critical lab result, you will be contacted sooner.   If You Need Anything After Your Visit  If you have any questions or concerns for your doctor, please call our main line at [phone] and press option 4 to reach your doctor's medical assistant. If no one answers, please leave a voicemail as directed and we will return your call as soon as possible. Messages left after 4 pm will be answered the following business day.   You may also send us a message via MyChart. We typically respond to MyChart messages within 1-2 business days.  For prescription refills, please ask your pharmacy to contact our office. Our fax number is [phone].  If you have an urgent issue when the clinic is closed that cannot wait until the next business day, you can page your doctor at the number below.    Please note that while we do our best to be available for urgent issues outside of office hours, we are not available 24/7.   If you have an urgent issue and are unable to reach us, you may choose to seek medical care at your doctor's office, retail clinic, urgent care center, or emergency room.  If you have a medical emergency, please immediately call 911 or go to the emergency department.  Pager Numbers  - Dr. Kowalski: [phone]  - Dr. Moye: [phone]  - Dr. Stewart:  [phone]  In the event of inclement weather, please call our main line at [phone] for an update on the status of any delays or closures.  Dermatology Medication Tips: Please keep the boxes that topical medications come in in order to help keep track of the instructions about where and how to use these. Pharmacies typically print the medication instructions only on the boxes and not directly on the medication tubes.   If your medication is too expensive, please contact our office at [phone] option 4 or send us a message through MyChart.   We are unable to tell what your co-pay for medications will be in advance as this is different depending on your insurance coverage. However, we may be able to find a substitute medication at lower cost or fill out paperwork to get insurance to cover a needed medication.   If a prior authorization is required to get your medication covered by your insurance company, please allow us 1-2 business days to complete this process.  Drug prices often vary depending on where the prescription is filled and some pharmacies may offer cheaper prices.  The website www.goodrx.com contains coupons for medications through different pharmacies. The prices here do not account for what the cost may be with help from insurance (it may be cheaper with your insurance), but the website can give you the price if you did not use any insurance.  - You can print the associated coupon and take it with   your prescription to the pharmacy.  - You may also stop by our office during regular business hours and pick up a GoodRx coupon card.  - If you need your prescription sent electronically to a different pharmacy, notify our office through [place] MyChart or by phone at [phone] option 4.     Si Usted Necesita Algo Despus de Su Visita  Tambin puede enviarnos un mensaje a travs de MyChart. Por lo general respondemos a los mensajes de MyChart en el transcurso de 1 a 2  das hbiles.  Para renovar recetas, por favor pida a su farmacia que se ponga en contacto con nuestra oficina. Nuestro nmero de fax es el [phone].  Si tiene un asunto urgente cuando la clnica est cerrada y que no puede esperar hasta el siguiente da hbil, puede llamar/localizar a su doctor(a) al nmero que aparece a continuacin.   Por favor, tenga en cuenta que aunque hacemos todo lo posible para estar disponibles para asuntos urgentes fuera del horario de oficina, no estamos disponibles las 24 horas del da, los 7 das de la semana.   Si tiene un problema urgente y no puede comunicarse con nosotros, puede optar por buscar atencin mdica  en el consultorio de su doctor(a), en una clnica privada, en un centro de atencin urgente o en una sala de emergencias.  Si tiene una emergencia mdica, por favor llame inmediatamente al 911 o vaya a la sala de emergencias.  Nmeros de bper  - Dr. Kowalski: [phone]  - Dra. Moye: [phone]  - Dra. Stewart: [phone]  En caso de inclemencias del tiempo, por favor llame a nuestra lnea principal al [phone] para una actualizacin sobre el estado de cualquier retraso o cierre.  Consejos para la medicacin en dermatologa: Por favor, guarde las cajas en las que vienen los medicamentos de uso tpico para ayudarle a seguir las instrucciones sobre dnde y cmo usarlos. Las farmacias generalmente imprimen las instrucciones del medicamento slo en las cajas y no directamente en los tubos del medicamento.   Si su medicamento es muy caro, por favor, pngase en contacto con nuestra oficina llamando al [phone] y presione la opcin 4 o envenos un mensaje a travs de MyChart.   No podemos decirle cul ser su copago por los medicamentos por adelantado ya que esto es diferente dependiendo de la cobertura de su seguro. Sin embargo, es posible que podamos encontrar un medicamento sustituto a menor costo o llenar un formulario para que el  seguro cubra el medicamento que se considera necesario.   Si se requiere una autorizacin previa para que su compaa de seguros cubra su medicamento, por favor permtanos de 1 a 2 das hbiles para completar este proceso.  Los precios de los medicamentos varan con frecuencia dependiendo del lugar de dnde se surte la receta y alguna farmacias pueden ofrecer precios ms baratos.  El sitio web www.goodrx.com tiene cupones para medicamentos de diferentes farmacias. Los precios aqu no tienen en cuenta lo que podra costar con la ayuda del seguro (puede ser ms barato con su seguro), pero el sitio web puede darle el precio si no utiliz ningn seguro.  - Puede imprimir el cupn correspondiente y llevarlo con su receta a la farmacia.  - Tambin puede pasar por nuestra oficina durante el horario de atencin regular y recoger una tarjeta de cupones de GoodRx.  - Si necesita que su receta se enve electrnicamente a una farmacia diferente, informe a nuestra oficina a travs de MyChart de [place]   o por telfono llamando al [phone] y presione la opcin 4.  

## 2022-07-28 ENCOUNTER — Encounter: Payer: Self-pay | Admitting: Dermatology

## 2022-08-10 DIAGNOSIS — M546 Pain in thoracic spine: Secondary | ICD-10-CM | POA: Diagnosis not present

## 2022-08-10 DIAGNOSIS — M5459 Other low back pain: Secondary | ICD-10-CM | POA: Diagnosis not present

## 2022-08-10 DIAGNOSIS — M9902 Segmental and somatic dysfunction of thoracic region: Secondary | ICD-10-CM | POA: Diagnosis not present

## 2022-08-10 DIAGNOSIS — M9903 Segmental and somatic dysfunction of lumbar region: Secondary | ICD-10-CM | POA: Diagnosis not present

## 2022-08-11 DIAGNOSIS — M5459 Other low back pain: Secondary | ICD-10-CM | POA: Diagnosis not present

## 2022-08-11 DIAGNOSIS — M546 Pain in thoracic spine: Secondary | ICD-10-CM | POA: Diagnosis not present

## 2022-08-11 DIAGNOSIS — M9902 Segmental and somatic dysfunction of thoracic region: Secondary | ICD-10-CM | POA: Diagnosis not present

## 2022-08-11 DIAGNOSIS — M9903 Segmental and somatic dysfunction of lumbar region: Secondary | ICD-10-CM | POA: Diagnosis not present

## 2022-12-09 ENCOUNTER — Ambulatory Visit: Payer: 59 | Admitting: Dermatology

## 2022-12-09 DIAGNOSIS — L738 Other specified follicular disorders: Secondary | ICD-10-CM

## 2022-12-09 DIAGNOSIS — W908XXA Exposure to other nonionizing radiation, initial encounter: Secondary | ICD-10-CM | POA: Diagnosis not present

## 2022-12-09 DIAGNOSIS — L814 Other melanin hyperpigmentation: Secondary | ICD-10-CM

## 2022-12-09 DIAGNOSIS — D1801 Hemangioma of skin and subcutaneous tissue: Secondary | ICD-10-CM | POA: Diagnosis not present

## 2022-12-09 DIAGNOSIS — D229 Melanocytic nevi, unspecified: Secondary | ICD-10-CM

## 2022-12-09 DIAGNOSIS — L578 Other skin changes due to chronic exposure to nonionizing radiation: Secondary | ICD-10-CM

## 2022-12-09 DIAGNOSIS — Z1283 Encounter for screening for malignant neoplasm of skin: Secondary | ICD-10-CM | POA: Diagnosis not present

## 2022-12-09 DIAGNOSIS — L821 Other seborrheic keratosis: Secondary | ICD-10-CM

## 2022-12-09 NOTE — Progress Notes (Signed)
   Follow-Up Visit   Subjective  Brittany Sanchez is a 52 y.o. female who presents for the following: Skin Cancer Screening and Full Body Skin Exam  The patient presents for Total-Body Skin Exam (TBSE) for skin cancer screening and mole check. The patient has spots, moles and lesions to be evaluated, some may be new or changing and the patient may have concern these could be cancer. She has a few small bumps on her right temple, present for years with no changes.    The following portions of the chart were reviewed this encounter and updated as appropriate: medications, allergies, medical history  Review of Systems:  No other skin or systemic complaints except as noted in HPI or Assessment and Plan.  Objective  Well appearing patient in no apparent distress; mood and affect are within normal limits.  A full examination was performed including scalp, head, eyes, ears, nose, lips, neck, chest, axillae, abdomen, back, buttocks, bilateral upper extremities, bilateral lower extremities, hands, feet, fingers, toes, fingernails, and toenails. All findings within normal limits unless otherwise noted below.   Relevant physical exam findings are noted in the Assessment and Plan.    Assessment & Plan   SKIN CANCER SCREENING PERFORMED TODAY.  ACTINIC DAMAGE - Chronic condition, secondary to cumulative UV/sun exposure - diffuse scaly erythematous macules with underlying dyspigmentation - Recommend daily broad spectrum sunscreen SPF 30+ to sun-exposed areas, reapply every 2 hours as needed.  - Staying in the shade or wearing long sleeves, sun glasses (UVA+UVB protection) and wide brim hats (4-inch brim around the entire circumference of the hat) are also recommended for sun protection.  - Call for new or changing lesions.   HEMANGIOMAS - Benign normal skin lesions - Benign-appearing - Call for any changes  MELANOCYTIC NEVI - Tan-brown and/or pink-flesh-colored symmetric macules and papules -  Benign appearing on exam today - Observation - Call clinic for new or changing moles - Recommend daily use of broad spectrum spf 30+ sunscreen to sun-exposed areas.   SEBORRHEIC KERATOSIS - Stuck-on, waxy, tan-brown papules and/or plaques, including right temple  - Benign-appearing - Discussed benign etiology and prognosis. 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 Call for changes.  LENTIGO  Exam: 1.5 x 0.8 cm brown patch of the right cheek; present for years with no changes per patient  Benign, observe.   Return 2 yrs, for TBSE.  Wendee Beavers, CMA, am acting as scribe for Armida Sans, MD .   Documentation: I have reviewed the above documentation for accuracy and completeness, and I agree with the above.  Armida Sans, MD

## 2022-12-09 NOTE — Patient Instructions (Addendum)

## 2022-12-18 ENCOUNTER — Encounter: Payer: Self-pay | Admitting: Dermatology

## 2023-02-28 ENCOUNTER — Ambulatory Visit: Payer: 59 | Admitting: Family Medicine

## 2023-02-28 VITALS — BP 114/70 | HR 62 | Temp 97.8°F | Ht 62.0 in | Wt 148.4 lb

## 2023-02-28 DIAGNOSIS — F419 Anxiety disorder, unspecified: Secondary | ICD-10-CM

## 2023-02-28 DIAGNOSIS — F410 Panic disorder [episodic paroxysmal anxiety] without agoraphobia: Secondary | ICD-10-CM | POA: Diagnosis not present

## 2023-02-28 DIAGNOSIS — F32A Depression, unspecified: Secondary | ICD-10-CM | POA: Diagnosis not present

## 2023-02-28 MED ORDER — CLONAZEPAM 0.5 MG PO TABS: 0.5000 mg | ORAL_TABLET | Freq: Two times a day (BID) | ORAL | 1 refills | Status: DC | PRN

## 2023-02-28 MED ORDER — FLUOXETINE HCL 40 MG PO CAPS: 40.0000 mg | ORAL_CAPSULE | Freq: Every day | ORAL | 1 refills | Status: DC

## 2023-02-28 NOTE — Progress Notes (Unsigned)
   Kimoni Pickerill T. Tametria Aho, MD, CAQ Sports Medicine [facility] [address] [place], [address]  Phone: [phone]  FAX: [phone]  Brittany Sanchez - 53 y.o. female  MRN [id number]  Date of Birth: August 28, 1970  Date: 02/28/2023  PCP: Eden Emms, NP  Referral: Eden Emms, NP  Chief Complaint  Patient presents with   Panic Attack    Patient states she has one a day which is unusual for her   Subjective:   Brittany Sanchez is a 53 y.o. very pleasant female patient with Body mass index is 27.14 kg/m. who presents with the following:  Acute panic attacks, and currently on Prozac 20 mg.  Used to be on Prozac 40 mg.  Will geel dizzy, sudden onset Shaking some Feels like giong to cry No chest pain No CP, no tachycard No sweating hands  Lasts about 15 minutes - now aboutDealing with back pain is really stressful  3 drinks a few times a week 32 oz of coffee  Went to psychiatry for a while    Review of Systems is noted in the HPI, as appropriate  Objective:   BP 114/70 (BP Location: Left Arm, Patient Position: Sitting, Cuff Size: Normal)   Pulse 62   Temp 97.8 F (36.6 C) (Temporal)   Ht 5\' 2"  (1.575 m)   Wt 148 lb 6 oz (67.3 kg)   SpO2 96%   BMI 27.14 kg/m   GEN: No acute distress; alert,appropriate. PULM: Breathing comfortably in no respiratory distress PSYCH: Normally interactive.   Laboratory and Imaging Data:  Assessment and Plan:   ***

## 2023-03-01 ENCOUNTER — Encounter: Payer: Self-pay | Admitting: Family Medicine

## 2023-05-18 ENCOUNTER — Telehealth: Payer: Self-pay | Admitting: Neurosurgery

## 2023-05-18 ENCOUNTER — Inpatient Hospital Stay
Admission: RE | Admit: 2023-05-18 | Discharge: 2023-05-18 | Disposition: A | Discharge status: Home / Self Care | Payer: Self-pay | Source: Ambulatory Visit | Attending: Neurosurgery | Admitting: Neurosurgery

## 2023-05-18 DIAGNOSIS — Z049 Encounter for examination and observation for unspecified reason: Secondary | ICD-10-CM

## 2023-05-18 NOTE — Telephone Encounter (Signed)
 Patient scheduled.

## 2023-05-18 NOTE — Telephone Encounter (Signed)
 Patient provided MRI images via  CD and MRI reports from [facility]. Reports are already scanned into media. Images are uploaded under MR Outside Spine on 05/18/2023.

## 2023-05-18 NOTE — Telephone Encounter (Signed)
 Patient stopped by the office and is requesting a second opinion with Dr. Mont Antis. She has previously been seeing [facility]. She dropped off her MRI disc, which I placed in the basket and the report has been scanned into her chart. I have faxed the signed records release to obtain her office and injection notes. She states that she has had two injections recently but no PT.

## 2023-05-19 NOTE — H&P (View-Only) (Signed)
 Referring Physician:  Dorothe Gaster, NP [address] [place],  [place] [address]  Primary Physician:  Dorothe Gaster, NP  History of Present Illness: 05/24/2023 Ms. Brittany Sanchez is here today with a chief complaint of right leg pain for the past 2-1/2 years.  She has difficulty with dragging her right leg.  Sitting standing and walking make it worse.  Oxycodone  has made her pain better.  She had hip surgery which did not help her pain.  Her pain extends all the way down her right leg to the top of her foot.  She has trouble with staying in 1 position for too long.  Sitting for long period of time bothers her.  She also has trouble with walking more than short distance.  Her hip surgery did not substantially help with the pain around her right hip.  Additionally, she did have pain in this area that preceded her hip surgery.  It has worsened recently. Bowel/Bladder Dysfunction: none  Conservative measures:  Physical therapy: has participated in PT at [facility]  Multimodal medical therapy including regular antiinflammatories: Methocarbamol , Oxycodone   Injections: no epidural steroid injections  Past Surgery: none  Brittany Sanchez has no symptoms of cervical myelopathy.  The symptoms are causing a significant impact on the patient's life.   I have utilized the care everywhere function in epic to review the outside records available from external health systems.  Review of Systems:  A 10 point review of systems is negative, except for the pertinent positives and negatives detailed in the HPI.  Past Medical History: Past Medical History:  Diagnosis Date   Hypothyroidism    Lyme disease    PMS (premenstrual syndrome)     Past Surgical History: Past Surgical History:  Procedure Laterality Date   AUGMENTATION MAMMAPLASTY Bilateral 1998   BREAST BIOPSY Right 04/06/2017   affirm bx coil marker,neg   BREAST ENHANCEMENT SURGERY     COLONOSCOPY N/A 09/29/2020   Procedure:  COLONOSCOPY;  Surgeon: Marnee Sink, MD;  Location: [facility];  Service: Endoscopy;  Laterality: N/A;   ENDOMETRIAL ABLATION     SHOULDER ARTHROSCOPY WITH LABRAL REPAIR Right 01/19/2017   Procedure: SHOULDER ARTHROSCOPY WITH LABRAL REPAIR, SLAP REPAIR;  Surgeon: Rande Bushy, MD;  Location: [facility];  Service: Orthopedics;  Laterality: Right;   SHOULDER ARTHROSCOPY WITH LABRAL REPAIR Right 05/03/2019   Procedure: SHOULDER ARTHROSCOPY WITH LABRAL REPAIR & Capsulorrhaphy;  Surgeon: Rande Bushy, MD;  Location: [facility];  Service: Orthopedics;  Laterality: Right;   TOTAL HIP ARTHROPLASTY Right 05/04/2022    Allergies: Allergies as of 05/24/2023 - Review Complete 05/24/2023  Allergen Reaction Noted   Seasonal ic [cholestatin] Other (See Comments) 04/23/2019    Medications:  Current Outpatient Medications:    estradiol  (VIVELLE -DOT) 0.0375 MG/24HR, SMARTSIG:1 Patch(s) T-DERMAL Every 3 Days, Disp: , Rfl:    FLUoxetine  (PROZAC ) 40 MG capsule, Take 1 capsule (40 mg total) by mouth daily., Disp: 90 capsule, Rfl: 1   ibuprofen (ADVIL) 200 MG tablet, Take 600 mg by mouth every 6 (six) hours as needed., Disp: , Rfl:    methocarbamol  (ROBAXIN ) 500 MG tablet, Take 500 mg by mouth every 8 (eight) hours as needed., Disp: , Rfl:    Multiple Vitamin (MULTIVITAMIN WITH MINERALS) TABS tablet, Take 1 tablet by mouth daily., Disp: , Rfl:    NP THYROID  90 MG tablet, Take 90 mg by mouth daily., Disp: , Rfl:    oxyCODONE  (OXY IR/ROXICODONE ) 5 MG immediate release tablet,  Take 1 tablet twice a day by oral route as needed for 30 days., Disp: , Rfl:    PRESCRIPTION MEDICATION, Testosterone  2% Cream- APPLY 0.5 gram (2 CLICKS) once a day behind the knee (DECREASE DOSE TO 1/4 GM IF OILY SKIN, ANCE OR HAIR GROWTH OCCURS, Disp: , Rfl:    progesterone  (PROMETRIUM ) 100 MG capsule, Take 200 mg by mouth at bedtime., Disp: , Rfl:    traZODone  (DESYREL ) 50 MG tablet, Take 0.5 tablets (25 mg total) by mouth as  needed., Disp: 60 tablet, Rfl: 2   Zinc  50 MG TABS, Take 1 tablet by mouth every other day., Disp: , Rfl:   Social History: Social History   Tobacco Use   Smoking status: Never   Smokeless tobacco: Never  Vaping Use   Vaping status: Never Used  Substance Use Topics   Alcohol use: Yes    Alcohol/week: 0.0 standard drinks of alcohol    Comment: 2 drinks per week   Drug use: No    Family Medical History: Family History  Problem Relation Age of Onset   Colon cancer Mother 38       colon cancer   Ovarian cancer Mother    Sleep apnea Brother    Alcohol abuse Other    Depression Other    Cancer Father 33       mesothelioma   Cancer Maternal Grandmother 22       ? ovarian   Diabetes Maternal Grandfather    Breast cancer Neg Hx     Physical Examination: Vitals:   05/24/23 0912  BP: 112/68    General: Patient is in no apparent distress. Attention to examination is appropriate.  Neck:   Supple.  Full range of motion.  Respiratory: Patient is breathing without any difficulty.   NEUROLOGICAL:     Awake, alert, oriented to person, place, and time.  Speech is clear and fluent.   Cranial Nerves: Pupils equal round and reactive to light.  Facial tone is symmetric.  Facial sensation is symmetric. Shoulder shrug is symmetric. Tongue protrusion is midline.  There is no pronator drift.  Strength: Side Biceps Triceps Deltoid Interossei Grip Wrist Ext. Wrist Flex.  R 5 5 5 5 5 5 5   L 5 5 5 5 5 5 5    Side Iliopsoas Quads Hamstring PF DF EHL  R 5 5 5 5 5 5   L 5 5 5 5 5 5    Reflexes are 1+ and symmetric at the biceps, triceps, brachioradialis, patella and achilles.   Hoffman's is absent.   Bilateral upper and lower extremity sensation is intact to light touch.    No evidence of dysmetria noted.  Gait is antalgic.  FABER negative   Medical Decision Making  Imaging: MRI L spine 05/18/2023 Mild to moderate left L4-5 neuroforaminal narrowing and mild left subarticular  zone narrowing.  Mild to moderate right L5-S1 neuroforaminal narrowing.  Right lateral Modic type I endplate changes and severe right disc height loss.  Minimal anterolisthesis of L4 and L5 with severe left L4-5 facet arthrosis.  Mild left L3-4 neuroforaminal narrowing with mild left subarticular zone narrowing.  I have personally reviewed the images and agree with the above interpretation.  Assessment and Plan: Brittany Sanchez is a pleasant 53 y.o. female with symptoms of a right L5 radiculopathy.  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  She has tried physical therapy, injections, medications without improvement.  At this point, I have recommended right  sided far lateral approach for foraminotomies and far lateral discectomy.  We discussed that she has approximately 50 to 60% chance of meaningful improvement.  I discussed the planned procedure at length with the patient, including the risks, benefits, alternatives, and indications. The risks discussed include but are not limited to bleeding, infection, need for reoperation, spinal fluid leak, stroke, vision loss, anesthetic complication, coma, paralysis, and even death. I also described in detail that improvement was not guaranteed.  The patient expressed understanding of these risks, and asked that we proceed with surgery. I described the surgery in layman's terms, and gave ample opportunity for questions, which were answered to the best of my ability.   I spent a total of 30 minutes in this patient's care today. This time was spent reviewing pertinent records including imaging studies, obtaining and confirming history, performing a directed evaluation, formulating and discussing my recommendations, and documenting the visit within the medical record.        Thank you for involving me in the care of this patient.      Emry Barbato K. Mont Antis MD, [facility] Neurosurgery

## 2023-05-19 NOTE — Progress Notes (Signed)
 Referring Physician:  Dorothe Gaster, NP [address] [place],  [place] [address]  Primary Physician:  Dorothe Gaster, NP  History of Present Illness: 05/24/2023 Brittany Sanchez is here today with a chief complaint of right leg pain for the past 2-1/2 years.  She has difficulty with dragging her right leg.  Sitting standing and walking make it worse.  Oxycodone  has made her pain better.  She had hip surgery which did not help her pain.  Her pain extends all the way down her right leg to the top of her foot.  She has trouble with staying in 1 position for too long.  Sitting for long period of time bothers her.  She also has trouble with walking more than short distance.  Her hip surgery did not substantially help with the pain around her right hip.  Additionally, she did have pain in this area that preceded her hip surgery.  It has worsened recently. Bowel/Bladder Dysfunction: none  Conservative measures:  Physical therapy: has participated in PT at [facility]  Multimodal medical therapy including regular antiinflammatories: Methocarbamol , Oxycodone   Injections: no epidural steroid injections  Past Surgery: none  Brittany Sanchez has no symptoms of cervical myelopathy.  The symptoms are causing a significant impact on the patient's life.   I have utilized the care everywhere function in epic to review the outside records available from external health systems.  Review of Systems:  A 10 point review of systems is negative, except for the pertinent positives and negatives detailed in the HPI.  Past Medical History: Past Medical History:  Diagnosis Date   Hypothyroidism    Lyme disease    PMS (premenstrual syndrome)     Past Surgical History: Past Surgical History:  Procedure Laterality Date   AUGMENTATION MAMMAPLASTY Bilateral 1998   BREAST BIOPSY Right 04/06/2017   affirm bx coil marker,neg   BREAST ENHANCEMENT SURGERY     COLONOSCOPY N/A 09/29/2020   Procedure:  COLONOSCOPY;  Surgeon: Marnee Sink, MD;  Location: [facility];  Service: Endoscopy;  Laterality: N/A;   ENDOMETRIAL ABLATION     SHOULDER ARTHROSCOPY WITH LABRAL REPAIR Right 01/19/2017   Procedure: SHOULDER ARTHROSCOPY WITH LABRAL REPAIR, SLAP REPAIR;  Surgeon: Rande Bushy, MD;  Location: [facility];  Service: Orthopedics;  Laterality: Right;   SHOULDER ARTHROSCOPY WITH LABRAL REPAIR Right 05/03/2019   Procedure: SHOULDER ARTHROSCOPY WITH LABRAL REPAIR & Capsulorrhaphy;  Surgeon: Rande Bushy, MD;  Location: [facility];  Service: Orthopedics;  Laterality: Right;   TOTAL HIP ARTHROPLASTY Right 05/04/2022    Allergies: Allergies as of 05/24/2023 - Review Complete 05/24/2023  Allergen Reaction Noted   Seasonal ic [cholestatin] Other (See Comments) 04/23/2019    Medications:  Current Outpatient Medications:    estradiol  (VIVELLE -DOT) 0.0375 MG/24HR, SMARTSIG:1 Patch(s) T-DERMAL Every 3 Days, Disp: , Rfl:    FLUoxetine  (PROZAC ) 40 MG capsule, Take 1 capsule (40 mg total) by mouth daily., Disp: 90 capsule, Rfl: 1   ibuprofen (ADVIL) 200 MG tablet, Take 600 mg by mouth every 6 (six) hours as needed., Disp: , Rfl:    methocarbamol  (ROBAXIN ) 500 MG tablet, Take 500 mg by mouth every 8 (eight) hours as needed., Disp: , Rfl:    Multiple Vitamin (MULTIVITAMIN WITH MINERALS) TABS tablet, Take 1 tablet by mouth daily., Disp: , Rfl:    NP THYROID  90 MG tablet, Take 90 mg by mouth daily., Disp: , Rfl:    oxyCODONE  (OXY IR/ROXICODONE ) 5 MG immediate release tablet,  Take 1 tablet twice a day by oral route as needed for 30 days., Disp: , Rfl:    PRESCRIPTION MEDICATION, Testosterone  2% Cream- APPLY 0.5 gram (2 CLICKS) once a day behind the knee (DECREASE DOSE TO 1/4 GM IF OILY SKIN, ANCE OR HAIR GROWTH OCCURS, Disp: , Rfl:    progesterone  (PROMETRIUM ) 100 MG capsule, Take 200 mg by mouth at bedtime., Disp: , Rfl:    traZODone  (DESYREL ) 50 MG tablet, Take 0.5 tablets (25 mg total) by mouth as  needed., Disp: 60 tablet, Rfl: 2   Zinc  50 MG TABS, Take 1 tablet by mouth every other day., Disp: , Rfl:   Social History: Social History   Tobacco Use   Smoking status: Never   Smokeless tobacco: Never  Vaping Use   Vaping status: Never Used  Substance Use Topics   Alcohol use: Yes    Alcohol/week: 0.0 standard drinks of alcohol    Comment: 2 drinks per week   Drug use: No    Family Medical History: Family History  Problem Relation Age of Onset   Colon cancer Mother 38       colon cancer   Ovarian cancer Mother    Sleep apnea Brother    Alcohol abuse Other    Depression Other    Cancer Father 33       mesothelioma   Cancer Maternal Grandmother 22       ? ovarian   Diabetes Maternal Grandfather    Breast cancer Neg Hx     Physical Examination: Vitals:   05/24/23 0912  BP: 112/68    General: Patient is in no apparent distress. Attention to examination is appropriate.  Neck:   Supple.  Full range of motion.  Respiratory: Patient is breathing without any difficulty.   NEUROLOGICAL:     Awake, alert, oriented to person, place, and time.  Speech is clear and fluent.   Cranial Nerves: Pupils equal round and reactive to light.  Facial tone is symmetric.  Facial sensation is symmetric. Shoulder shrug is symmetric. Tongue protrusion is midline.  There is no pronator drift.  Strength: Side Biceps Triceps Deltoid Interossei Grip Wrist Ext. Wrist Flex.  R 5 5 5 5 5 5 5   L 5 5 5 5 5 5 5    Side Iliopsoas Quads Hamstring PF DF EHL  R 5 5 5 5 5 5   L 5 5 5 5 5 5    Reflexes are 1+ and symmetric at the biceps, triceps, brachioradialis, patella and achilles.   Hoffman's is absent.   Bilateral upper and lower extremity sensation is intact to light touch.    No evidence of dysmetria noted.  Gait is antalgic.  FABER negative   Medical Decision Making  Imaging: MRI L spine 05/18/2023 Mild to moderate left L4-5 neuroforaminal narrowing and mild left subarticular  zone narrowing.  Mild to moderate right L5-S1 neuroforaminal narrowing.  Right lateral Modic type I endplate changes and severe right disc height loss.  Minimal anterolisthesis of L4 and L5 with severe left L4-5 facet arthrosis.  Mild left L3-4 neuroforaminal narrowing with mild left subarticular zone narrowing.  I have personally reviewed the images and agree with the above interpretation.  Assessment and Plan: Brittany Sanchez is a pleasant 53 y.o. female with symptoms of a right L5 radiculopathy.  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  She has tried physical therapy, injections, medications without improvement.  At this point, I have recommended right  sided far lateral approach for foraminotomies and far lateral discectomy.  We discussed that she has approximately 50 to 60% chance of meaningful improvement.  I discussed the planned procedure at length with the patient, including the risks, benefits, alternatives, and indications. The risks discussed include but are not limited to bleeding, infection, need for reoperation, spinal fluid leak, stroke, vision loss, anesthetic complication, coma, paralysis, and even death. I also described in detail that improvement was not guaranteed.  The patient expressed understanding of these risks, and asked that we proceed with surgery. I described the surgery in layman's terms, and gave ample opportunity for questions, which were answered to the best of my ability.   I spent a total of 30 minutes in this patient's care today. This time was spent reviewing pertinent records including imaging studies, obtaining and confirming history, performing a directed evaluation, formulating and discussing my recommendations, and documenting the visit within the medical record.        Thank you for involving me in the care of this patient.      Emry Barbato K. Mont Antis MD, [facility] Neurosurgery

## 2023-05-24 ENCOUNTER — Telehealth: Payer: Self-pay

## 2023-05-24 ENCOUNTER — Encounter: Payer: Self-pay | Admitting: Neurosurgery

## 2023-05-24 ENCOUNTER — Ambulatory Visit (INDEPENDENT_AMBULATORY_CARE_PROVIDER_SITE_OTHER): Admitting: Neurosurgery

## 2023-05-24 VITALS — BP 112/68 | Ht 62.0 in | Wt 146.0 lb

## 2023-05-24 DIAGNOSIS — M5416 Radiculopathy, lumbar region: Secondary | ICD-10-CM

## 2023-05-24 DIAGNOSIS — M48061 Spinal stenosis, lumbar region without neurogenic claudication: Secondary | ICD-10-CM | POA: Diagnosis not present

## 2023-05-24 DIAGNOSIS — M5116 Intervertebral disc disorders with radiculopathy, lumbar region: Secondary | ICD-10-CM

## 2023-05-24 MED ORDER — METHOCARBAMOL 500 MG PO TABS: 500.0000 mg | ORAL_TABLET | Freq: Three times a day (TID) | ORAL | 0 refills | Status: DC | PRN

## 2023-05-24 NOTE — Telephone Encounter (Signed)
 Upon collecting clinicals to submit for surgical authorization, I was unable to locate Ms. Robert's PT notes to submit to insurance. Pre-charted notes indicated that the patient had participated in PT at [facility]. I called to find out from the patient where she had done PT and if it had been in the last year. She says that she has done PT at [facility] but is unable to relay if this had been done in the last year.   Patient to call [facility] on 05/25/2023 to see if she can obtain medical records. Patient is aware she may need to complete Physical Therapy until a PT deems that it is unhelpful for her condition and discharges her.   Patient indicates understanding. Dr. Mont Antis was notified.

## 2023-05-24 NOTE — Patient Instructions (Signed)
 Please see below for information in regards to your upcoming surgery:   Planned surgery: Right L5-S1 far lateral discectomy   Surgery date: 06/13/23 at [facility] (Medical Mall: [address] [place], [place] [address]) - you will find out your arrival time the business day before your surgery.   Pre-op appointment at [facility] Pre-admit Testing: you will receive a call with a date/time for this appointment. If you are scheduled for an in person appointment, Pre-admit Testing is located on the first floor of the [facility] building, [address], [address]. During this appointment, they will advise you which medications you can take the morning of surgery, and which medications you will need to hold for surgery. Labs (such as blood work, EKG) may be done at your pre-op appointment. You are not required to fast for these labs. Should you need to change your pre-op appointment, please call Pre-admit testing at [phone].      Common restrictions after surgery: No bending, lifting, or twisting ("BLT"). Avoid lifting objects heavier than 10 pounds for the first 6 weeks after surgery. Where possible, avoid household activities that involve lifting, bending, reaching, pushing, or pulling such as laundry, vacuuming, grocery shopping, and childcare. Try to arrange for help from friends and family for these activities while you heal. Do not drive while taking prescription pain medication. Weeks 6 through 12 after surgery: avoid lifting more than 25 pounds.     How to contact us :  If you have any questions/concerns before or after surgery, you can reach us  at [phone], or you can send a mychart message. We can be reached by phone or mychart 8am-4pm, Monday-Friday.  *Please note: Calls after 4pm are forwarded to a third party answering service. Mychart messages are not routinely monitored during evenings, weekends, and holidays. Please call our office to  contact the answering service for urgent concerns during non-business hours.     If you have FMLA/disability paperwork, please drop it off or fax it to [phone], attention Patty.   Appointments/FMLA & disability paperwork: Gerlean Kocher, & Maryann Smalls Registered Nurses/Surgery schedulers: Tonyia Marschall & Lauren Medical Assistants: Donnajean Fuse Physician Assistants: Ludwig Safer, PA-C, Anastacio Karvonen, PA-C & Lucetta Russel, PA-C Surgeons: Jodeen Munch, MD & Henderson Lock, MD   [facility] PREADMIT TESTING VISIT and SURGERY INFORMATION SHEET   Now that surgery has been scheduled you can anticipate several phone calls from [facility] services. A pharmacy technician will call you to verify your current list of medications taken at home.               The Pre-Service Center will call to verify your insurance information and to give you billing estimates and information.             The Preadmit Testing Office will be calling to schedule a visit to obtain information for the anesthesia team and provide instructions on preparation for surgery.  What can you expect for the Preadmit Testing Visit: Appointments may be scheduled in-person or by telephone.  If a telephone visit is scheduled, you may be asked to come into the office to have lab tests or other studies performed.   This visit will not be completed any greater than 14 days prior to your surgery.  If your surgery has been scheduled for a future date, please do not be alarmed if we have not contacted you to schedule an appointment more than a month prior to the surgery  date.    Please be prepared to provide the following information during this appointment:            -Personal medical history                                               -Medication and allergy list            -Any history of problems with anesthesia              -Recent lab work or diagnostic studies            -Please notify us  of any needs we  should be aware of to provide the best care possible           -You will be provided with instructions on how to prepare for your surgery.    On The Day of Surgery:  You must have a driver to take you home after surgery, you will be asked not to drive for 24 hours following surgery.  Taxi, [employer] and non-medical transport will not be acceptable means of transportation unless you have a responsible individual who will be traveling with you.  Visitors in the surgical area:   2 people will be able to visit you in your room once your preparation for surgery has been completed. During surgery, your visitors will be asked to wait in the Surgery Waiting Area.  It is not a requirement for them to stay, if they prefer to leave and come back.  Your visitor(s) will be given an update once the surgery has been completed.  No visitors are allowed in the initial recovery room to respect patient privacy and safety.  Once you are more awake and transfer to the secondary recovery area, or are transferred to an inpatient room, visitors will again be able to see you.  To respect and protect your privacy: We will ask on the day of surgery who your driver will be and what the contact number for that individual will be. We will ask if it is okay to share information with this individual, or if there is an alternative individual that we, or the surgeon, should contact to provide updates and information. If family or friends come to the surgical information desk requesting information about you, who you have not listed with us , no information will be given.   It may be helpful to designate someone as the main contact who will be responsible for updating your other friends and family.    PREADMIT TESTING OFFICE: [phone] SAME DAY SURGERY: [phone] We look forward to caring for you before and throughout the process of your surgery.

## 2023-05-25 NOTE — Telephone Encounter (Signed)
 Patient's PT notes were received and reviewed by Severiano Danish RN and I this morning. Patient's physical therapy course was within the last year however she was seen for her right hip and was never formally discharged. We explained that her insurance would not approve surgery just based on her symptoms and that she would need a PT evaluation.   Patient is aware that her pending surgery is dependent on discharge from PT for back pain. Either Kendelyn or I will contact the patient in the next few days to determine if her surgery date will stay the same or need to be pushed back.

## 2023-05-25 NOTE — Addendum Note (Signed)
 Addended by: Eustacia Urbanek on: 05/25/2023 11:33 AM   Modules accepted: Orders

## 2023-05-27 ENCOUNTER — Telehealth: Payer: Self-pay

## 2023-05-27 NOTE — Telephone Encounter (Signed)
-----   Message from Nurse Clovis Dar sent at 05/27/2023  8:55 AM EDT ----- Voicemail left to discuss Patient PT and possible need to post-pone surgery. ----- Message ----- From: Alvira Azure, RN Sent: 05/27/2023   8:00 AM EDT To: [facility] Rn  Check on Patient's PT status and determine if surgery needs to be rescheduled.

## 2023-05-30 NOTE — Telephone Encounter (Signed)
 FYI Brittany Sanchez and I canceled Brittany Sanchez surgery for now pending new Physical therapy evaluation and need for discharge from PT for insurance.

## 2023-05-30 NOTE — Telephone Encounter (Signed)
 Patient called to discuss her PT and upcoming surgery.

## 2023-06-01 ENCOUNTER — Telehealth: Payer: Self-pay

## 2023-06-01 ENCOUNTER — Other Ambulatory Visit: Payer: Self-pay

## 2023-06-01 DIAGNOSIS — Z01818 Encounter for other preprocedural examination: Secondary | ICD-10-CM

## 2023-06-01 NOTE — Telephone Encounter (Signed)
 Received mychart message from the patient requesting to reschedule to 06/20/23 or 06/22/23. Per Dr Mont Antis, OK to move to 5/21.

## 2023-06-01 NOTE — Telephone Encounter (Signed)
 Received physical therapy note. Unclear if this will be sufficient for surgery approval, but will submit for review. Surgery has been rescheduled to 06/13/23, but will be postponed if we do not have approval in time. Patient has been notified of this via mychart.

## 2023-06-01 NOTE — Telephone Encounter (Addendum)
 Sent message to OR scheduling to change surgery date to 5/21. Moved post op appointments accordingly. Surgery instructions were given at appointment on 05/24/23 and will stay the same (other than surgery date and new post-op appointments). Notified pt via mychart.

## 2023-06-06 ENCOUNTER — Other Ambulatory Visit

## 2023-06-08 NOTE — Progress Notes (Addendum)
 I have reviewed the MRI from [facility] dated 06/24/23 following discussion with Dr. Mont Antis for Brittany Sanchez right L5 radiculopathy.  A far right lateral disc protrusion at L5-1 as progressed, now contacting and displacing the exiting right L5 nerve root beyond the foramen.  The far right protrusion has progressed since an MRI of the lumbar spine at [facility] 12/16/21 with increased endplate edema on the right.  This protrusion correlates with the patients right L5 radicular symptoms.  Addendum:  The disc protrusion also results in right L5-S1 foraminal compression impacting the exiting right L5 nerve root.

## 2023-06-13 ENCOUNTER — Ambulatory Visit: Admit: 2023-06-13 | Admitting: Neurosurgery

## 2023-06-13 SURGERY — FAR LATERAL DECOMPRESSION 1 LEVEL
Anesthesia: General | Laterality: Right

## 2023-06-14 ENCOUNTER — Encounter
Admission: RE | Admit: 2023-06-14 | Discharge: 2023-06-14 | Disposition: A | Discharge status: Home / Self Care | Source: Ambulatory Visit | Attending: Neurosurgery | Admitting: Neurosurgery

## 2023-06-14 ENCOUNTER — Other Ambulatory Visit: Payer: Self-pay

## 2023-06-14 VITALS — BP 110/64 | HR 72 | Resp 16 | Ht 62.0 in | Wt 145.7 lb

## 2023-06-14 DIAGNOSIS — Z01812 Encounter for preprocedural laboratory examination: Secondary | ICD-10-CM

## 2023-06-14 DIAGNOSIS — Z01818 Encounter for other preprocedural examination: Secondary | ICD-10-CM | POA: Insufficient documentation

## 2023-06-14 DIAGNOSIS — R9431 Abnormal electrocardiogram [ECG] [EKG]: Secondary | ICD-10-CM | POA: Diagnosis not present

## 2023-06-14 DIAGNOSIS — R001 Bradycardia, unspecified: Secondary | ICD-10-CM | POA: Insufficient documentation

## 2023-06-14 HISTORY — DX: Other specified postprocedural states: Z98.890

## 2023-06-14 HISTORY — DX: Unspecified osteoarthritis, unspecified site: M19.90

## 2023-06-14 HISTORY — DX: Radiculopathy, lumbar region: M54.16

## 2023-06-14 HISTORY — DX: Nausea with vomiting, unspecified: R11.2

## 2023-06-14 HISTORY — DX: Anxiety disorder, unspecified: F41.9

## 2023-06-14 LAB — TYPE AND SCREEN
ABO/RH(D): A NEG
Antibody Screen: NEGATIVE

## 2023-06-14 LAB — URINALYSIS, COMPLETE (UACMP) WITH MICROSCOPIC
Bacteria, UA: NONE SEEN
Bilirubin Urine: NEGATIVE
Glucose, UA: NEGATIVE mg/dL
Hgb urine dipstick: NEGATIVE
Ketones, ur: NEGATIVE mg/dL
Leukocytes,Ua: NEGATIVE
Nitrite: NEGATIVE
Protein, ur: NEGATIVE mg/dL
Specific Gravity, Urine: 1.021 (ref 1.005–1.030)
pH: 6 (ref 5.0–8.0)

## 2023-06-14 LAB — BASIC METABOLIC PANEL WITH GFR
Anion gap: 8 (ref 5–15)
BUN: 22 mg/dL — ABNORMAL HIGH (ref 6–20)
CO2: 27 mmol/L (ref 22–32)
Calcium: 9.5 mg/dL (ref 8.9–10.3)
Chloride: 102 mmol/L (ref 98–111)
Creatinine, Ser: 1.08 mg/dL — ABNORMAL HIGH (ref 0.44–1.00)
GFR, Estimated: >60 mL/min (ref >60)
Glucose, Bld: 88 mg/dL (ref 70–99)
Potassium: 4.1 mmol/L (ref 3.5–5.1)
Sodium: 137 mmol/L (ref 135–145)

## 2023-06-14 LAB — CBC
HCT: 39.5 % (ref 36.0–46.0)
Hemoglobin: 13.4 g/dL (ref 12.0–15.0)
MCH: 30.8 pg (ref 26.0–34.0)
MCHC: 33.9 g/dL (ref 30.0–36.0)
MCV: 90.8 fL (ref 80.0–100.0)
Platelets: 251 10*3/uL (ref 150–400)
RBC: 4.35 MIL/uL (ref 3.87–5.11)
RDW: 12.3 % (ref 11.5–15.5)
WBC: 6.9 10*3/uL (ref 4.0–10.5)
nRBC: 0 % (ref 0.0–0.2)

## 2023-06-14 LAB — SURGICAL PCR SCREEN
MRSA, PCR: NEGATIVE
Staphylococcus aureus: NEGATIVE

## 2023-06-14 NOTE — Patient Instructions (Addendum)
 Your procedure is scheduled on: 06/22/23 - Wednesday Report to the Registration Desk on the 1st floor of the [facility]. To find out your arrival time, please call [phone] between 1PM - 3PM on: 06/21/23 - Tuesday If your arrival time is 6:00 am, do not arrive before that time as the [facility] entrance doors do not open until 6:00 am.  REMEMBER: Instructions that are not followed completely may result in serious medical risk, up to and including death; or upon the discretion of your surgeon and anesthesiologist your surgery may need to be rescheduled.  Do not eat food after midnight the night before surgery.  No gum chewing or hard candies.  You may however, drink CLEAR liquids up to 2 hours before you are scheduled to arrive for your surgery. Do not drink anything within 2 hours of your scheduled arrival time.  Clear liquids include: - water   - apple juice without pulp - gatorade (not RED colors) - black coffee or tea (Do NOT add milk or creamers to the coffee or tea) Do NOT drink anything that is not on this list.  You may continue taking if needed Anti-inflammatories (NSAIDS) such as Advil, Aleve, Ibuprofen, Motrin, Naproxen, Naprosyn and Aspirin based products such as Excedrin, Goody's Powder, BC Powder.  Stop ANY OVER THE COUNTER supplements until after surgery.  You may take Tylenol  if needed for pain up until the day of surgery.  ON THE DAY OF SURGERY ONLY TAKE THESE MEDICATIONS WITH SIPS OF WATER :  FLUoxetine  (PROZAC )  NP THYROID   oxyCODONE  if needed   No Alcohol for 24 hours before or after surgery.  No Smoking including e-cigarettes for 24 hours before surgery.  No chewable tobacco products for at least 6 hours before surgery.  No nicotine patches on the day of surgery.  Do not use any "recreational" drugs for at least a week (preferably 2 weeks) before your surgery.  Please be advised that the combination of cocaine and anesthesia may have negative  outcomes, up to and including death. If you test positive for cocaine, your surgery will be cancelled.  On the morning of surgery brush your teeth with toothpaste and water , you may rinse your mouth with mouthwash if you wish. Do not swallow any toothpaste or mouthwash.  Contact lenses, hearing aids and dentures may not be worn into surgery.  Do not bring valuables to the hospital. [facility] is not responsible for any missing/lost belongings or valuables.   Notify your doctor if there is any change in your medical condition (cold, fever, infection).  Wear comfortable clothing (specific to your surgery type) to the hospital.  After surgery, you can help prevent lung complications by doing breathing exercises.  Take deep breaths and cough every 1-2 hours. Your doctor may order a device called an Incentive Spirometer to help you take deep breaths.  When coughing or sneezing, hold a pillow firmly against your incision with both hands. This is called "splinting." Doing this helps protect your incision. It also decreases belly discomfort.  If you are being admitted to the hospital overnight, leave your suitcase in the car. After surgery it may be brought to your room.  In case of increased patient census, it may be necessary for you, the patient, to continue your postoperative care in the Same Day Surgery department.  If you are being discharged the day of surgery, you will not be allowed to drive home. You will need a responsible individual to drive you home and  stay with you for 24 hours after surgery.   If you are taking public transportation, you will need to have a responsible individual with you.  Please call the Pre-admissions Testing Dept. at [phone] if you have any questions about these instructions.  Surgery Visitation Policy:  Patients having surgery or a procedure may have two visitors.  Children under the age of 4 must have an adult with them who is not the  patient.  Inpatient Visitation:    Visiting hours are 7 a.m. to 8 p.m. Up to four visitors are allowed at one time in a patient room. The visitors may rotate out with other people during the day.  One visitor age 76 or older may stay with the patient overnight and must be in the room by 8 p.m.   Pre-operative 5 CHG Bath Instructions   You can play a key role in reducing the risk of infection after surgery. Your skin needs to be as free of germs as possible. You can reduce the number of germs on your skin by washing with CHG (chlorhexidine  gluconate) soap before surgery. CHG is an antiseptic soap that kills germs and continues to kill germs even after washing.   DO NOT use if you have an allergy to chlorhexidine /CHG or antibacterial soaps. If your skin becomes reddened or irritated, stop using the CHG and notify one of our RNs at [phone].   Please shower with the CHG soap starting 4 days before surgery using the following schedule: 05/17 - 05/21.  Please keep in mind the following:  DO NOT shave, including legs and underarms, starting the day of your first shower.   You may shave your face at any point before/day of surgery.  Place clean sheets on your bed the day you start using CHG soap. Use a clean washcloth (not used since being washed) for each shower. DO NOT sleep with pets once you start using the CHG.   CHG Shower Instructions:  If you choose to wash your hair and private area, wash first with your normal shampoo/soap.  After you use shampoo/soap, rinse your hair and body thoroughly to remove shampoo/soap residue.  Turn the water  OFF and apply about 3 tablespoons (45 ml) of CHG soap to a CLEAN washcloth.  Apply CHG soap ONLY FROM YOUR NECK DOWN TO YOUR TOES (washing for 3-5 minutes)  DO NOT use CHG soap on face, private areas, open wounds, or sores.  Pay special attention to the area where your surgery is being performed.  If you are having back surgery, having someone wash  your back for you may be helpful. Wait 2 minutes after CHG soap is applied, then you may rinse off the CHG soap.  Pat dry with a clean towel  Put on clean clothes/pajamas   If you choose to wear lotion, please use ONLY the CHG-compatible lotions on the back of this paper.     Additional instructions for the day of surgery: DO NOT APPLY any lotions, deodorants, cologne, or perfumes.   Put on clean/comfortable clothes.  Brush your teeth.  Ask your nurse before applying any prescription medications to the skin. 5.   Do not wear jewelry, make-up, hairpins, clips or nail polish. 6. For welded (permanent) jewelry: bracelets, anklets, waist bands, etc.  Please have this removed prior to surgery.  If it is not removed, there is a chance that hospital personnel will need to cut it off on the day of surgery.  CHG Compatible Lotions   Aveeno Moisturizing lotion  Cetaphil Moisturizing Cream  Cetaphil Moisturizing Lotion  Clairol Herbal Essence Moisturizing Lotion, Dry Skin  Clairol Herbal Essence Moisturizing Lotion, Extra Dry Skin  Clairol Herbal Essence Moisturizing Lotion, Normal Skin  Curel Age Defying Therapeutic Moisturizing Lotion with Alpha Hydroxy  Curel Extreme Care Body Lotion  Curel Soothing Hands Moisturizing Hand Lotion  Curel Therapeutic Moisturizing Cream, Fragrance-Free  Curel Therapeutic Moisturizing Lotion, Fragrance-Free  Curel Therapeutic Moisturizing Lotion, Original Formula  Eucerin Daily Replenishing Lotion  Eucerin Dry Skin Therapy Plus Alpha Hydroxy Crme  Eucerin Dry Skin Therapy Plus Alpha Hydroxy Lotion  Eucerin Original Crme  Eucerin Original Lotion  Eucerin Plus Crme Eucerin Plus Lotion  Eucerin TriLipid Replenishing Lotion  Keri Anti-Bacterial Hand Lotion  Keri Deep Conditioning Original Lotion Dry Skin Formula Softly Scented  Keri Deep Conditioning Original Lotion, Fragrance Free Sensitive Skin Formula  Keri Lotion Fast Absorbing Fragrance Free  Sensitive Skin Formula  Keri Lotion Fast Absorbing Softly Scented Dry Skin Formula  Keri Original Lotion  Keri Skin Renewal Lotion Keri Silky Smooth Lotion  Keri Silky Smooth Sensitive Skin Lotion  Nivea Body Creamy Conditioning Oil  Nivea Body Extra Enriched Teacher, adult education Moisturizing Lotion Nivea Crme  Nivea Skin Firming Lotion  NutraDerm 30 Skin Lotion  NutraDerm Skin Lotion  NutraDerm Therapeutic Skin Cream  NutraDerm Therapeutic Skin Lotion  ProShield Protective Hand Cream  Provon moisturizing lotion

## 2023-06-21 MED ORDER — CHLORHEXIDINE GLUCONATE 0.12 % MT SOLN
15.0000 mL | Freq: Once | OROMUCOSAL | Status: AC
  Administered 2023-06-22: 15 mL via OROMUCOSAL

## 2023-06-21 MED ORDER — CEFAZOLIN SODIUM-DEXTROSE 2-4 GM/100ML-% IV SOLN
2.0000 g | INTRAVENOUS | Status: AC
  Administered 2023-06-22: 2 g via INTRAVENOUS

## 2023-06-21 MED ORDER — LACTATED RINGERS IV SOLN
INTRAVENOUS | Status: DC
Start: 2023-06-21 — End: 2023-06-22

## 2023-06-21 MED ORDER — CEFAZOLIN IN SODIUM CHLORIDE 2-0.9 GM/100ML-% IV SOLN
2.0000 g | Freq: Once | INTRAVENOUS | Status: DC
  Filled 2023-06-21: qty 100

## 2023-06-21 MED ORDER — ORAL CARE MOUTH RINSE: 15.0000 mL | Freq: Once | OROMUCOSAL | Status: AC

## 2023-06-22 ENCOUNTER — Ambulatory Visit: Admitting: Anesthesiology

## 2023-06-22 ENCOUNTER — Other Ambulatory Visit: Payer: Self-pay

## 2023-06-22 ENCOUNTER — Ambulatory Visit: Payer: Self-pay | Admitting: Urgent Care

## 2023-06-22 ENCOUNTER — Ambulatory Visit
Admission: RE | Admit: 2023-06-22 | Discharge: 2023-06-22 | Disposition: A | Discharge status: Home / Self Care | Source: Ambulatory Visit | Attending: Neurosurgery | Admitting: Neurosurgery

## 2023-06-22 ENCOUNTER — Encounter: Payer: Self-pay | Admitting: Neurosurgery

## 2023-06-22 ENCOUNTER — Ambulatory Visit

## 2023-06-22 ENCOUNTER — Encounter
Admission: RE | Disposition: A | Discharge status: Home / Self Care | Payer: Self-pay | Source: Ambulatory Visit | Attending: Neurosurgery

## 2023-06-22 DIAGNOSIS — M5117 Intervertebral disc disorders with radiculopathy, lumbosacral region: Secondary | ICD-10-CM | POA: Diagnosis present

## 2023-06-22 DIAGNOSIS — F32A Depression, unspecified: Secondary | ICD-10-CM | POA: Diagnosis not present

## 2023-06-22 DIAGNOSIS — Z01818 Encounter for other preprocedural examination: Secondary | ICD-10-CM

## 2023-06-22 DIAGNOSIS — E039 Hypothyroidism, unspecified: Secondary | ICD-10-CM | POA: Insufficient documentation

## 2023-06-22 DIAGNOSIS — Z01812 Encounter for preprocedural laboratory examination: Secondary | ICD-10-CM

## 2023-06-22 DIAGNOSIS — M5416 Radiculopathy, lumbar region: Secondary | ICD-10-CM

## 2023-06-22 DIAGNOSIS — F419 Anxiety disorder, unspecified: Secondary | ICD-10-CM | POA: Diagnosis not present

## 2023-06-22 HISTORY — PX: FAR LATERAL DECOMPRESSION 1 LEVEL: SHX7571

## 2023-06-22 LAB — ABO/RH: ABO/RH(D): A NEG

## 2023-06-22 LAB — POCT PREGNANCY, URINE: Preg Test, Ur: NEGATIVE

## 2023-06-22 SURGERY — FAR LATERAL DECOMPRESSION 1 LEVEL
Anesthesia: General | Laterality: Right

## 2023-06-22 MED ORDER — SODIUM CHLORIDE (PF) 0.9 % IJ SOLN
INTRAMUSCULAR | Status: AC
  Filled 2023-06-22: qty 20

## 2023-06-22 MED ORDER — PROPOFOL 10 MG/ML IV BOLUS
INTRAVENOUS | Status: DC | PRN
  Administered 2023-06-22: 50 ug/kg/min via INTRAVENOUS
  Administered 2023-06-22: 100 mg via INTRAVENOUS

## 2023-06-22 MED ORDER — PROPOFOL 10 MG/ML IV BOLUS
INTRAVENOUS | Status: AC
  Filled 2023-06-22: qty 20

## 2023-06-22 MED ORDER — BUPIVACAINE HCL (PF) 0.5 % IJ SOLN
INTRAMUSCULAR | Status: AC
  Filled 2023-06-22: qty 30

## 2023-06-22 MED ORDER — KETOROLAC TROMETHAMINE 30 MG/ML IJ SOLN
INTRAMUSCULAR | Status: DC | PRN
  Administered 2023-06-22: 30 mg via INTRAVENOUS

## 2023-06-22 MED ORDER — SENNA 8.6 MG PO TABS
1.0000 | ORAL_TABLET | Freq: Two times a day (BID) | ORAL | 0 refills | Status: DC | PRN
  Filled 2023-06-22: qty 30, 15d supply, fill #0

## 2023-06-22 MED ORDER — DEXAMETHASONE SODIUM PHOSPHATE 10 MG/ML IJ SOLN
INTRAMUSCULAR | Status: DC | PRN
Start: 2023-06-22 — End: 2023-06-22
  Administered 2023-06-22: 5 mg via INTRAVENOUS

## 2023-06-22 MED ORDER — ONDANSETRON HCL 4 MG/2ML IJ SOLN
INTRAMUSCULAR | Status: AC
  Filled 2023-06-22: qty 2

## 2023-06-22 MED ORDER — OXYCODONE HCL 5 MG PO TABS
5.0000 mg | ORAL_TABLET | Freq: Once | ORAL | Status: AC | PRN
  Administered 2023-06-22: 5 mg via ORAL

## 2023-06-22 MED ORDER — OXYCODONE HCL 5 MG PO TABS
ORAL_TABLET | ORAL | Status: AC
  Filled 2023-06-22: qty 1

## 2023-06-22 MED ORDER — FENTANYL CITRATE (PF) 100 MCG/2ML IJ SOLN
INTRAMUSCULAR | Status: DC | PRN
Start: 2023-06-22 — End: 2023-06-22
  Administered 2023-06-22: 50 ug via INTRAVENOUS

## 2023-06-22 MED ORDER — OXYCODONE HCL 5 MG/5ML PO SOLN: 5.0000 mg | Freq: Once | ORAL | Status: AC | PRN

## 2023-06-22 MED ORDER — SUCCINYLCHOLINE CHLORIDE 200 MG/10ML IV SOSY
PREFILLED_SYRINGE | INTRAVENOUS | Status: DC | PRN
  Administered 2023-06-22: 80 mg via INTRAVENOUS

## 2023-06-22 MED ORDER — METHOCARBAMOL 500 MG PO TABS
500.0000 mg | ORAL_TABLET | Freq: Four times a day (QID) | ORAL | 0 refills | Status: AC | PRN
Start: 2023-06-22 — End: ?
  Filled 2023-06-22: qty 120, 30d supply, fill #0

## 2023-06-22 MED ORDER — ONDANSETRON HCL 4 MG/2ML IJ SOLN
INTRAMUSCULAR | Status: DC | PRN
  Administered 2023-06-22: 4 mg via INTRAVENOUS

## 2023-06-22 MED ORDER — KETAMINE HCL 50 MG/5ML IJ SOSY
PREFILLED_SYRINGE | INTRAMUSCULAR | Status: AC
  Filled 2023-06-22: qty 5

## 2023-06-22 MED ORDER — PHENYLEPHRINE HCL-NACL 20-0.9 MG/250ML-% IV SOLN
INTRAVENOUS | Status: AC
  Filled 2023-06-22: qty 250

## 2023-06-22 MED ORDER — MIDAZOLAM HCL 2 MG/2ML IJ SOLN
INTRAMUSCULAR | Status: AC
  Filled 2023-06-22: qty 2

## 2023-06-22 MED ORDER — BUPIVACAINE LIPOSOME 1.3 % IJ SUSP
INTRAMUSCULAR | Status: AC
Start: 2023-06-22 — End: ?
  Filled 2023-06-22: qty 20

## 2023-06-22 MED ORDER — PHENYLEPHRINE 80 MCG/ML (10ML) SYRINGE FOR IV PUSH (FOR BLOOD PRESSURE SUPPORT)
PREFILLED_SYRINGE | INTRAVENOUS | Status: AC
  Filled 2023-06-22: qty 10

## 2023-06-22 MED ORDER — EPHEDRINE SULFATE-NACL 50-0.9 MG/10ML-% IV SOSY
PREFILLED_SYRINGE | INTRAVENOUS | Status: DC | PRN
  Administered 2023-06-22 (×5): 10 mg via INTRAVENOUS

## 2023-06-22 MED ORDER — BUPIVACAINE-EPINEPHRINE (PF) 0.5% -1:200000 IJ SOLN
INTRAMUSCULAR | Status: DC | PRN
  Administered 2023-06-22: 5 mL via PERINEURAL

## 2023-06-22 MED ORDER — LIDOCAINE HCL (CARDIAC) PF 100 MG/5ML IV SOSY
PREFILLED_SYRINGE | INTRAVENOUS | Status: DC | PRN
  Administered 2023-06-22: 60 mg via INTRAVENOUS

## 2023-06-22 MED ORDER — REMIFENTANIL HCL 1 MG IV SOLR
INTRAVENOUS | Status: DC | PRN
  Administered 2023-06-22: .1 ug/kg/min via INTRAVENOUS

## 2023-06-22 MED ORDER — DEXAMETHASONE SODIUM PHOSPHATE 10 MG/ML IJ SOLN
INTRAMUSCULAR | Status: AC
  Filled 2023-06-22: qty 1

## 2023-06-22 MED ORDER — CHLORHEXIDINE GLUCONATE 0.12 % MT SOLN
OROMUCOSAL | Status: AC
  Filled 2023-06-22: qty 15

## 2023-06-22 MED ORDER — METHYLPREDNISOLONE ACETATE 40 MG/ML IJ SUSP
INTRAMUSCULAR | Status: AC
  Filled 2023-06-22: qty 1

## 2023-06-22 MED ORDER — LIDOCAINE HCL (PF) 2 % IJ SOLN
INTRAMUSCULAR | Status: AC
  Filled 2023-06-22: qty 5

## 2023-06-22 MED ORDER — PROPOFOL 1000 MG/100ML IV EMUL
INTRAVENOUS | Status: AC
  Filled 2023-06-22: qty 100

## 2023-06-22 MED ORDER — SODIUM CHLORIDE (PF) 0.9 % IJ SOLN
INTRAMUSCULAR | Status: DC | PRN
  Administered 2023-06-22: 60 mL

## 2023-06-22 MED ORDER — BUPIVACAINE-EPINEPHRINE (PF) 0.5% -1:200000 IJ SOLN
INTRAMUSCULAR | Status: AC
  Filled 2023-06-22: qty 40

## 2023-06-22 MED ORDER — ACETAMINOPHEN 10 MG/ML IV SOLN
INTRAVENOUS | Status: AC
  Filled 2023-06-22: qty 100

## 2023-06-22 MED ORDER — METHYLPREDNISOLONE ACETATE 40 MG/ML IJ SUSP
INTRAMUSCULAR | Status: DC | PRN
  Administered 2023-06-22: 40 mg

## 2023-06-22 MED ORDER — SURGIFLO WITH THROMBIN (HEMOSTATIC MATRIX KIT) OPTIME
TOPICAL | Status: DC | PRN
  Administered 2023-06-22: 1 via TOPICAL

## 2023-06-22 MED ORDER — OXYCODONE HCL 5 MG PO TABS
5.0000 mg | ORAL_TABLET | ORAL | 0 refills | Status: AC | PRN
  Filled 2023-06-22: qty 30, 5d supply, fill #0

## 2023-06-22 MED ORDER — REMIFENTANIL HCL 1 MG IV SOLR
INTRAVENOUS | Status: AC
  Filled 2023-06-22: qty 1000

## 2023-06-22 MED ORDER — FENTANYL CITRATE (PF) 100 MCG/2ML IJ SOLN
INTRAMUSCULAR | Status: AC
  Filled 2023-06-22: qty 2

## 2023-06-22 MED ORDER — FENTANYL CITRATE (PF) 100 MCG/2ML IJ SOLN
25.0000 ug | INTRAMUSCULAR | Status: DC | PRN
  Administered 2023-06-22 (×2): 25 ug via INTRAVENOUS

## 2023-06-22 MED ORDER — KETAMINE HCL 10 MG/ML IJ SOLN
INTRAMUSCULAR | Status: DC | PRN
  Administered 2023-06-22: 20 mg via INTRAVENOUS

## 2023-06-22 MED ORDER — CEFAZOLIN SODIUM-DEXTROSE 2-4 GM/100ML-% IV SOLN
INTRAVENOUS | Status: AC
  Filled 2023-06-22: qty 100

## 2023-06-22 MED ORDER — MIDAZOLAM HCL 2 MG/2ML IJ SOLN
INTRAMUSCULAR | Status: DC | PRN
  Administered 2023-06-22: 2 mg via INTRAVENOUS

## 2023-06-22 MED ORDER — ACETAMINOPHEN 10 MG/ML IV SOLN
INTRAVENOUS | Status: DC | PRN
  Administered 2023-06-22: 1000 mg via INTRAVENOUS

## 2023-06-22 MED ORDER — 0.9 % SODIUM CHLORIDE (POUR BTL) OPTIME
TOPICAL | Status: DC | PRN
  Administered 2023-06-22: 500 mL

## 2023-06-22 SURGICAL SUPPLY — 30 items
BASIN KIT SINGLE STR (MISCELLANEOUS) ×1 IMPLANT
BUR NEURO DRILL SOFT 3.0X3.8M (BURR) ×1 IMPLANT
DERMABOND ADVANCED .7 DNX12 (GAUZE/BANDAGES/DRESSINGS) ×1 IMPLANT
DRAPE C ARM PK CFD 31 SPINE (DRAPES) ×1 IMPLANT
DRAPE LAPAROTOMY 100X77 ABD (DRAPES) ×1 IMPLANT
DRAPE MICROSCOPE SPINE 48X150 (DRAPES) ×1 IMPLANT
DRSG OPSITE POSTOP 3X4 (GAUZE/BANDAGES/DRESSINGS) ×1 IMPLANT
ELECTRODE EZSTD 165MM 6.5IN (MISCELLANEOUS) ×1 IMPLANT
ELECTRODE REM PT RTRN 9FT ADLT (ELECTROSURGICAL) ×1 IMPLANT
GLOVE BIOGEL PI IND STRL 6.5 (GLOVE) ×1 IMPLANT
GLOVE SURG SYN 6.5 ES PF (GLOVE) ×1 IMPLANT
GLOVE SURG SYN 6.5 PF PI (GLOVE) ×1 IMPLANT
GLOVE SURG SYN 8.5 E (GLOVE) ×3 IMPLANT
GLOVE SURG SYN 8.5 PF PI (GLOVE) ×3 IMPLANT
GOWN SRG LRG LVL 4 IMPRV REINF (GOWNS) ×1 IMPLANT
GOWN SRG XL LVL 3 NONREINFORCE (GOWNS) ×1 IMPLANT
KIT SPINAL PRONEVIEW (KITS) ×1 IMPLANT
MANIFOLD NEPTUNE II (INSTRUMENTS) ×1 IMPLANT
MARKER SKIN DUAL TIP RULER LAB (MISCELLANEOUS) ×1 IMPLANT
NDL SAFETY ECLIPSE 18X1.5 (NEEDLE) ×1 IMPLANT
NS IRRIG 500ML POUR BTL (IV SOLUTION) ×1 IMPLANT
PACK LAMINECTOMY ARMC (PACKS) ×1 IMPLANT
PAD ARMBOARD POSITIONER FOAM (MISCELLANEOUS) ×1 IMPLANT
SURGIFLO W/THROMBIN 8M KIT (HEMOSTASIS) ×1 IMPLANT
SUT STRATA 3-0 15 PS-2 (SUTURE) ×1 IMPLANT
SUT VIC AB 0 CT1 27XCR 8 STRN (SUTURE) ×1 IMPLANT
SUT VIC AB 2-0 CT1 18 (SUTURE) ×1 IMPLANT
SYR 30ML LL (SYRINGE) ×2 IMPLANT
SYR 3ML LL SCALE MARK (SYRINGE) ×1 IMPLANT
TRAP FLUID SMOKE EVACUATOR (MISCELLANEOUS) ×1 IMPLANT

## 2023-06-22 NOTE — Interval H&P Note (Signed)
 History and Physical Interval Note:  06/22/2023 6:57 AM  Brittany Sanchez  has presented today for surgery, with the diagnosis of M54.16 Lumbar Radiculopathy.  The various methods of treatment have been discussed with the patient and family. After consideration of risks, benefits and other options for treatment, the patient has consented to  Procedure(s) with comments: FAR LATERAL DECOMPRESSION 1 LEVEL (Right) - MINIMALLY INVASIVE (MIS ) RIGHT L5-S1 FAR LATERAL DISCECTOMY as a surgical intervention.  The patient's history has been reviewed, patient examined, no change in status, stable for surgery.  I have reviewed the patient's chart and labs.  Questions were answered to the patient's satisfaction.    Heart sounds normal no MRG. Chest Clear to Auscultation Bilaterally.   Aracelis Ulrey

## 2023-06-22 NOTE — Anesthesia Preprocedure Evaluation (Signed)
 Anesthesia Evaluation  Patient identified by MRN, date of birth, ID band Patient awake    Reviewed: Allergy & Precautions, NPO status , Patient's Chart, lab work & pertinent test results, reviewed documented beta blocker date and time   History of Anesthesia Complications (+) PONV and history of anesthetic complications  Airway Mallampati: II  TM Distance: >3 FB Neck ROM: Full    Dental  (+) Chipped   Pulmonary neg pulmonary ROS   Pulmonary exam normal breath sounds clear to auscultation       Cardiovascular (-) angina (-) DOE negative cardio ROS Normal cardiovascular exam Rhythm:Regular Rate:Normal     Neuro/Psych  PSYCHIATRIC DISORDERS Anxiety Depression    negative neurological ROS  negative psych ROS   GI/Hepatic negative GI ROS, Neg liver ROS,neg GERD  ,,  Endo/Other  negative endocrine ROSHypothyroidism    Renal/GU      Musculoskeletal   Abdominal   Peds  Hematology negative hematology ROS (+)   Anesthesia Other Findings Past Medical History: No date: Anxiety No date: Arthritis No date: Hypothyroidism No date: Lumbar radiculopathy No date: Lyme disease No date: PMS (premenstrual syndrome) No date: PONV (postoperative nausea and vomiting) No date: PONV (postoperative nausea and vomiting) 2023: Sinus bradycardia  Past Surgical History: 1998: AUGMENTATION MAMMAPLASTY; Bilateral 04/06/2017: BREAST BIOPSY; Right     Comment:  affirm bx coil marker,neg No date: BREAST ENHANCEMENT SURGERY 09/29/2020: COLONOSCOPY; N/A     Comment:  Procedure: COLONOSCOPY;  Surgeon: Marnee Sink, MD;                Location: [facility];  Service: Endoscopy;                Laterality: N/A; No date: ENDOMETRIAL ABLATION 01/19/2017: SHOULDER ARTHROSCOPY WITH LABRAL REPAIR; Right     Comment:  Procedure: SHOULDER ARTHROSCOPY WITH LABRAL REPAIR, SLAP              REPAIR;  Surgeon: Rande Bushy, MD;  Location:  [facility]               ORS;  Service: Orthopedics;  Laterality: Right; 05/03/2019: SHOULDER ARTHROSCOPY WITH LABRAL REPAIR; Right     Comment:  Procedure: SHOULDER ARTHROSCOPY WITH LABRAL REPAIR &               Capsulorrhaphy;  Surgeon: Rande Bushy, MD;                Location: [facility] ORS;  Service: Orthopedics;  Laterality:               Right; 05/04/2022: TOTAL HIP ARTHROPLASTY; Right     Reproductive/Obstetrics negative OB ROS                             Anesthesia Physical Anesthesia Plan  ASA: 2  Anesthesia Plan: General ETT   Post-op Pain Management:    Induction: Intravenous  PONV Risk Score and Plan: 3 and 4 or greater and Ondansetron , Dexamethasone , Midazolam , TIVA and Propofol  infusion  Airway Management Planned: Oral ETT  Additional Equipment:   Intra-op Plan:   Post-operative Plan: Extubation in OR  Informed Consent: I have reviewed the patients History and Physical, chart, labs and discussed the procedure including the risks, benefits and alternatives for the proposed anesthesia with the patient or authorized representative who has indicated his/her understanding and acceptance.     Dental Advisory Given  Plan Discussed with: Anesthesiologist, CRNA and Surgeon  Anesthesia  Plan Comments: (Patient consented for risks of anesthesia including but not limited to:  - adverse reactions to medications - damage to eyes, teeth, lips or other oral mucosa - nerve damage due to positioning  - sore throat or hoarseness - Damage to heart, brain, nerves, lungs, other parts of body or loss of life  Patient voiced understanding and assent.)       Anesthesia Quick Evaluation

## 2023-06-22 NOTE — Op Note (Signed)
 Indications: The patient is a 53 yo female who presented with lumbar radiculopathy who failed conservative management.  Findings: far lateral disc herniation at L5/S1.  Preoperative Diagnosis: Lumbar radiculopathy Postoperative Diagnosis: same   EBL: 10 ml IVF: see anesthesia record Drains: none Disposition: Extubated and Stable to PACU Complications: none  No foley catheter was placed.   Preoperative Note:   Risks of surgery discussed include: infection, bleeding, stroke, coma, death, paralysis, CSF leak, nerve/spinal cord injury, numbness, tingling, weakness, complex regional pain syndrome, recurrent stenosis and/or disc herniation, vascular injury, development of instability, neck/back pain, need for further surgery, persistent symptoms, development of deformity, and the risks of anesthesia. The patient understood these risks and agreed to proceed.  Operative Note:   1) Right L5/S1 Far lateral discectomy and foraminotomy  The patient was then brought from the preoperative center with intravenous access established.  The patient underwent general anesthesia and endotracheal tube intubation, and was then rotated on the Lindsey rail top where all pressure points were appropriately padded.  The skin was then thoroughly cleansed.  Perioperative antibiotic prophylaxis was administered.  Sterile prep and drapes were then applied and a timeout was then observed.  C-arm was brought into the field under sterile conditions, and the L5/S1 disc space identified and marked with an incision on the right ~4cm lateral to midline.   Once this was complete a 2 cm incision was opened with the use of a #10 blade knife.  The Metrx tubes were sequentially advanced under lateral fluoroscopy until a 18 x 70 mm Metrx tube was placed over the right sacral ala just lateral to the L5/S1 facet joint and secured to the bed.    The microscope was then sterilely brought into the field and muscle creep was hemostased  with a bipolar and resected with a pituitary rongeur.  A Bovie extender was then used to expose the transverse process and lateral facet.  Careful attention was placed to not violate the facet capsule. A 3 mm matchstick drill bit was then used to drill off the top of the transverse process, lateral L5/S1 facet, and lateral pars.  The intertransverse membrane was identified, and carefully dissected free from the caudal transverse process.    Using careful dissection, the superior aspect of the S1 pedical was identified and exposed down to its junction with the vertebral body.  The disc was identified. The disc was entered using a small Penfield 4. The L5 nerve root was identified and freed using a Penfield 4 and balltip probe.  The disc herniation was identified and dissected free using a balltip probe. The pituitary rongeur was used to remove the extruded disc fragments. A Kerrison punch was used to remove residual bony compression from the superior articulating process. Once the nerve root were noted to be relaxed and under less tension the ball-tipped feeler was passed along the foramen proximally to to ensure no residual compression was noted.    Depo-Medrol  was placed along the nerve root.  The area was irrigated. The tube system was then removed under microscopic visualization and hemostasis was obtained with a bipolar.    The fascial layer was reapproximated with the use of a 0- Vicryl suture.  Subcutaneous tissue layer was reapproximated using 2-0 Vicryl suture.  3-0 monocryl was used on the skin. The skin was then cleansed and Dermabond was used to close the skin opening.  Patient was then rotated back to the preoperative bed awakened from anesthesia and taken to recovery all counts  are correct in this case.   I performed the entire procedure with the assistance of Anastacio Karvonen PA as an Designer, television/film set. An assistant was required for this procedure due to the complexity.  The assistant provided  assistance in tissue manipulation and suction, and was required for the successful and safe performance of the procedure. I performed the critical portions of the procedure.    Jodeen Munch MD

## 2023-06-22 NOTE — Anesthesia Procedure Notes (Signed)
 Procedure Name: Intubation Date/Time: 06/22/2023 7:21 AM  Performed by: Wilkins Hardy I, CRNAPre-anesthesia Checklist: Patient identified, Emergency Drugs available, Suction available and Patient being monitored Patient Re-evaluated:Patient Re-evaluated prior to induction Oxygen Delivery Method: Circle system utilized Preoxygenation: Pre-oxygenation with 100% oxygen Induction Type: IV induction Ventilation: Mask ventilation without difficulty Laryngoscope Size: McGrath and 3 Grade View: Grade I Tube type: Oral Tube size: 6.5 mm Number of attempts: 1 Airway Equipment and Method: Stylet and Oral airway Placement Confirmation: ETT inserted through vocal cords under direct vision, positive ETCO2 and breath sounds checked- equal and bilateral Secured at: 20 cm Tube secured with: Tape Dental Injury: Teeth and Oropharynx as per pre-operative assessment

## 2023-06-22 NOTE — Anesthesia Postprocedure Evaluation (Signed)
 Anesthesia Post Note  Patient: Brittany Sanchez  Procedure(s) Performed: FAR LATERAL DECOMPRESSION 1 LEVEL (Right)  Patient location during evaluation: PACU Anesthesia Type: General Level of consciousness: awake and alert Pain management: pain level controlled Vital Signs Assessment: post-procedure vital signs reviewed and stable Respiratory status: spontaneous breathing, nonlabored ventilation, respiratory function stable and patient connected to nasal cannula oxygen Cardiovascular status: blood pressure returned to baseline and stable Postop Assessment: no apparent nausea or vomiting Anesthetic complications: no  No notable events documented.   Last Vitals:  Vitals:   06/22/23 0900 06/22/23 0915  BP: (!) 117/56 107/70  Pulse: 91 80  Resp: 15 (!) 23  Temp: 36.7 C   SpO2: 100% 98%    Last Pain:  Vitals:   06/22/23 0936  TempSrc:   PainSc: 4                  Enrique Harvest

## 2023-06-22 NOTE — Transfer of Care (Signed)
 Immediate Anesthesia Transfer of Care Note  Patient: Brittany Sanchez  Procedure(s) Performed: FAR LATERAL DECOMPRESSION 1 LEVEL (Right)  Patient Location: PACU  Anesthesia Type:General  Level of Consciousness: awake and alert   Airway & Oxygen Therapy: Patient Spontanous Breathing  Post-op Assessment: Report given to RN and Post -op Vital signs reviewed and stable  Post vital signs: stable  Last Vitals:  Vitals Value Taken Time  BP    Temp 36.7 C 06/22/23 0900  Pulse    Resp    SpO2      Last Pain:  Vitals:   06/22/23 0616  TempSrc: Temporal  PainSc: 6          Complications: No notable events documented.

## 2023-06-22 NOTE — Discharge Summary (Signed)
 Discharge Summary  Patient ID: Brittany Sanchez MRN: [id number] DOB/AGE: November 29, 1970 53 y.o.  Admit date: 06/22/2023 Discharge date: 06/22/2023  Admission Diagnoses: M54.16 Lumbar Radiculopathy   Discharge Diagnoses:  Active Problems:   Lumbar radiculopathy   Discharged Condition: good  Hospital Course:  Brittany Sanchez is a 53 y.o presenting with right lumbar radiculopathy s/p right L5-S1 far lateral discectomy. Her intraoperative course was uncomplicated. She was monitored in PACU and discharged home after ambulating, urinating, and tolerating PO intake. She was given prescriptions for Oxycodone , Robaxin , and Senna to take as needed.   Consults: None  Significant Diagnostic Studies: NA  Treatments: surgery: as above. Please see separately dictated operative report for further details   Discharge Exam: Blood pressure 110/70, pulse 82, temperature 98.2 F (36.8 C), temperature source Temporal, resp. rate 12, SpO2 95%. CN grossly intact MAEW Incision c/d/I with post-op dressing in place  Disposition: Discharge disposition: 01-Home or Self Care        Allergies as of 06/22/2023       Reactions   Cinnamon Itching        Medication List     TAKE these medications    acetaminophen  500 MG tablet Commonly known as: TYLENOL  Take 500 mg by mouth every 6 (six) hours as needed for moderate pain (pain score 4-6).   estradiol  0.0375 MG/24HR Commonly known as: VIVELLE -DOT Place 1 patch onto the skin every 3 (three) days.   FLUoxetine  20 MG capsule Commonly known as: PROZAC  Take 20 mg by mouth daily. What changed: Another medication with the same name was removed. Continue taking this medication, and follow the directions you see here.   ibuprofen 200 MG tablet Commonly known as: ADVIL Take 600 mg by mouth every 6 (six) hours as needed for moderate pain (pain score 4-6).   K2-D3 MAX PO Take 1 capsule by mouth daily.   LITHIUM PO Take 5 mg by mouth daily.    methocarbamol  500 MG tablet Commonly known as: ROBAXIN  Take 1 tablet (500 mg total) by mouth every 6 (six) hours as needed. What changed: when to take this   NP Thyroid  90 MG tablet Generic drug: thyroid  Take 90 mg by mouth daily.   OVER THE COUNTER MEDICATION daily. PREGNOLENE   oxyCODONE  5 MG immediate release tablet Commonly known as: Roxicodone  Take 1 tablet (5 mg total) by mouth every 4 (four) hours as needed for severe pain (pain score 7-10). What changed:  when to take this reasons to take this   PRESCRIPTION MEDICATION Testosterone  2% Cream- APPLY 0.5 gram (2 CLICKS) once a day behind the knee (DECREASE DOSE TO 1/4 GM IF OILY SKIN, ANCE OR HAIR GROWTH OCCURS   progesterone  100 MG capsule Commonly known as: PROMETRIUM  Take 200 mg by mouth at bedtime.   senna 8.6 MG Tabs tablet Commonly known as: SENOKOT Take 1 tablet (8.6 mg total) by mouth 2 (two) times daily as needed for mild constipation.   traZODone  50 MG tablet Commonly known as: DESYREL  Take 0.5 tablets (25 mg total) by mouth as needed. What changed:  how much to take when to take this         Signed: Noble Bateman 06/22/2023, 11:06 AM

## 2023-06-22 NOTE — Discharge Instructions (Signed)

## 2023-06-23 ENCOUNTER — Encounter: Payer: Self-pay | Admitting: Neurosurgery

## 2023-06-27 NOTE — Progress Notes (Signed)
   REFERRING PHYSICIAN:  Dorothe Gaster, [address] [place],  [place] [address]  DOS: 06/22/23  right L5-S1 far lateral discectomy  HISTORY OF PRESENT ILLNESS: Brittany Sanchez is approximately 2 weeks status post above surgery. Was given oxycodone  and robaxin  on discharge from the hospital.   She continues with LBP and right leg pain to her foot. She has numbness and tingling. Not much of a changed since prior to her surgery. No weakness but leg feels heavy.   Her pain management provider has refilled her oxycodone  ([facility]). She is taking prn robaxin .    PHYSICAL EXAMINATION:  General: Patient is well developed, well nourished, calm, collected, and in no apparent distress.   NEUROLOGICAL:  General: In no acute distress.   Awake, alert, oriented to person, place, and time.  Pupils equal round and reactive to light.  Facial tone is symmetric.    Strength:          Side Iliopsoas Quads Hamstring PF DF EHL  R 5 5 5 5 5 5   L 5 5 5 5 5 5    Incision c/d/i   ROS (Neurologic):  Negative except as noted above  IMAGING: Nothing new to review.   ASSESSMENT/PLAN:  Brittany Sanchez is doing well s/p above surgery. Treatment options reviewed with patient and following plan made:   - I have advised the patient to lift up to 10 pounds until 6 weeks after surgery (follow up with Dr. Mont Antis).  - Reviewed wound care.  - No bending, twisting, or lifting.  - Continue on current medications including prn oxycodone . Will get further refills from pain management (Ramos). - Continue prn robaxin .  - Prescription for medrol  dose pack. Reviewed dosing and side effects. Take as directed.   - Follow up as scheduled in 4 weeks and prn.   Advised to contact the office if any questions or concerns arise.  Lucetta Russel PA-C Department of neurosurgery

## 2023-06-28 ENCOUNTER — Encounter: Admitting: Orthopedic Surgery

## 2023-07-05 ENCOUNTER — Ambulatory Visit (INDEPENDENT_AMBULATORY_CARE_PROVIDER_SITE_OTHER): Admitting: Orthopedic Surgery

## 2023-07-05 ENCOUNTER — Encounter: Payer: Self-pay | Admitting: Orthopedic Surgery

## 2023-07-05 VITALS — BP 112/64 | Temp 98.5°F | Ht 62.0 in | Wt 145.0 lb

## 2023-07-05 DIAGNOSIS — Z9889 Other specified postprocedural states: Secondary | ICD-10-CM

## 2023-07-05 DIAGNOSIS — M5416 Radiculopathy, lumbar region: Secondary | ICD-10-CM

## 2023-07-05 MED ORDER — METHYLPREDNISOLONE 4 MG PO TBPK
ORAL_TABLET | ORAL | 0 refills | Status: DC
Start: 2023-07-05 — End: 2023-07-26

## 2023-07-21 ENCOUNTER — Encounter: Admitting: Neurosurgery

## 2023-07-26 ENCOUNTER — Encounter: Payer: Self-pay | Admitting: Neurosurgery

## 2023-07-26 ENCOUNTER — Encounter: Admitting: Neurosurgery

## 2023-07-26 ENCOUNTER — Ambulatory Visit (INDEPENDENT_AMBULATORY_CARE_PROVIDER_SITE_OTHER): Admitting: Neurosurgery

## 2023-07-26 VITALS — BP 116/78 | Temp 97.6°F | Ht 62.0 in | Wt 145.0 lb

## 2023-07-26 DIAGNOSIS — M5416 Radiculopathy, lumbar region: Secondary | ICD-10-CM

## 2023-07-26 DIAGNOSIS — Z9889 Other specified postprocedural states: Secondary | ICD-10-CM

## 2023-07-26 MED ORDER — GABAPENTIN 300 MG PO CAPS
300.0000 mg | ORAL_CAPSULE | Freq: Three times a day (TID) | ORAL | 0 refills | Status: DC
Start: 2023-07-26 — End: 2023-08-22

## 2023-07-26 NOTE — Progress Notes (Signed)
   REFERRING PHYSICIAN:  Wendee Lynwood HERO, [address] [place],  [place] [address]  DOS: 06/22/23  right L5-S1 far lateral discectomy  HISTORY OF PRESENT ILLNESS: Brittany Sanchez is status post above surgery.   She is still having significant right lower back pain.  Her leg pain is a little bit better.  She is obviously uncomfortable.    PHYSICAL EXAMINATION:  General: Patient is well developed, well nourished, calm, collected, and in no apparent distress.   NEUROLOGICAL:  General: In no acute distress.   Awake, alert, oriented to person, place, and time.  Pupils equal round and reactive to light.  Facial tone is symmetric.    Strength:          Side Iliopsoas Quads Hamstring PF DF EHL  R 5 5 5 5 5 5   L 5 5 5 5 5 5    Incision c/d/i   ROS (Neurologic):  Negative except as noted above  IMAGING: Nothing new to review.   ASSESSMENT/PLAN:  Brittany Sanchez is doing fair s/p above surgery.  Her leg pain has improved somewhat, but she still has severe lower back pain.  She still very uncomfortable.  I have started her on gabapentin.  If this does not help her, we will have to reevaluate with additional imaging on her next appointment.  I would consider a repeat MRI scan and a nerve conduction study as well as consideration of a right hip MRI.    If we do not find an obvious etiology of her pain, we could consider SI joint injections or spinal cord stimulation.  Reeves Daisy MD Department of neurosurgery

## 2023-08-08 ENCOUNTER — Ambulatory Visit (INDEPENDENT_AMBULATORY_CARE_PROVIDER_SITE_OTHER): Admitting: Family Medicine

## 2023-08-08 ENCOUNTER — Ambulatory Visit: Payer: Self-pay

## 2023-08-08 ENCOUNTER — Encounter: Payer: Self-pay | Admitting: Family Medicine

## 2023-08-08 VITALS — BP 90/68 | HR 56 | Temp 98.3°F | Ht 62.4 in | Wt 149.0 lb

## 2023-08-08 DIAGNOSIS — S060X0A Concussion without loss of consciousness, initial encounter: Secondary | ICD-10-CM

## 2023-08-08 NOTE — Telephone Encounter (Signed)
 Patient scheduled to see Dr. Watt today; fyi for appt.

## 2023-08-08 NOTE — Patient Instructions (Signed)
Mild Traumatic Brain Injury (CONCUSSSION):  Brittany Dayley, MD, [facility] Adapted from [facility], [facility], and [employer]  Think of the human brain almost as an egg yolk and your skull as an egg shell. When your head or body takes a hit, it can cause your brain to shake around inside your skull, injuring the brain. A concussion is not only caused by a hit to the head, but can also be caused by an impact to the body that ends up shaking your brain in your skull, such as whiplash.  A common misconception about concussion is that one loses consciousness. However, loss of consciousness occurs in only less than 10% of concussion cases.  Concussive symptoms typically resolve in 7 to 10 days (sports-related concussions) or within 3 months (non-athletes).  Approximately 20% of people have symptoms after 6 weeks.  With concussions, we have learned that it generally takes youth longer to recover from concussions. Another thing that we now know about concussions is that it usually takes female athletes longer to recover than female athletes.  No two concussions are identical. In fact, there are at least six different clinical trajectories that concussions may take.  Signs and Symptoms of Concussion:  The signs and symptoms of a concussion are incredibly important because a concussion doesn't show up on imaging like an x-ray, CT, or MRI scan and there is no objective test, like a blood or saliva test, that can determine if a patient has a concussion. A doctor makes a concussion diagnosis based on the results of a comprehensive examination, which includes observing signs of concussion and patients reporting symptoms of concussion appearing after an impact to the head or body. Concussion signs and symptoms are the brain's way of showing it is injured and not functioning normally.  CONCUSSION SIGNS  Concussion signs are what someone could observe about you to  determine if you have a concussion.   Common concussion signs include:  Loss of consciousness Problems with balance Glazed look in the eyes Amnesia Delayed response to questions Forgetting an instruction, confusion about an assignment or position, or  confusion of the game, score, or opponent Inappropriate crying Inappropriate laughter Vomiting  CONCUSSION SYMPTOMS  Concussion symptoms are what someone who is concussed will tell you that they are experiencing. Concussion symptoms typically fall into six major categories:  1- Somatic (Physical) Symptoms  Headache Light-headedness Dizziness Nausea Sensitivity to light Sensitivity to noise  2- Cognitive Symptoms  Difficulties with attention Memory problems Loss of focus Difficulty multitasking Difficulty completing mental tasks  3- Sleep Symptoms  Sleeping more than usual Sleeping less than usual Having trouble falling asleep  4- Emotional Symptoms  Anxiety Depression Panic attacks  5 - Vestibular Symptoms  The vestibular system is affected in nearly 60% of youth and adolescent athletes following a concussion.  But what is the vestibular system?  It's the sensory system that helps with your sense of balance and spatial orientation. Think of a gyroscope! With help from your inner ears, your vestibular system detects the motion or position of your head in space. It sends information to your brain that's needed for balance and stable vision.  Need an example? If you're moving and looking at moving objects at the same time (think riding in a car), you're able to stay focused and not lose visual clarity. During a vestibular concussion, your gyroscope isn't working at full potential.  Difficulty with balance Dizziness. It may feel like the room is spinning   or a slow, wavy sensation. (Like you're on a boat!) Trouble stabilizing vision when moving your head. (We call this Vestibular-Ocular Reflux, or VOR.) Think  back to riding in a car. With a vestibular concussion, you can't stay focused. (The technical name for this is Visual Motion Sensitivity, or VMS.) Triggers  These 3 things could bring on vestibular symptoms:  Dynamic movements Busy environments, like the grocery store Crowds  6 - Oculo-motor  A concussion that affects the ocular, or visual, system of the brain. Typically, patients with ocular-motor concussions report pressure headaches in the front of their head, feeling more tired than normal, and becoming more symptomatic doing math or science exercises at school.  Patients also experience difficulties with their eyes working together. Follow along to explore some of the most common symptoms.  Convergence  The eyes converge when viewing objects up close, such as with reading. With convergence problems, patients may see a double image as a target moves closer to them. Typically, without a concussion, objects can be brought very close without doubling.  Accommodation  Accommodation problems cause an object to become blurry as it is viewed up close. Accommodative and convergence problems are often experienced together and can impact reading and other near-vision activities.  Pursuits and Saccades  The eyes use pursuit eye movements to follow objects; while saccade eyes movements allow the eyes to shift rapidly from one object to another. Tracking objects, reading a book, scrolling on a computer or even watching for a moving car while crossing the street can be difficult when patients have problems with pursuit and saccade eye movements.  Misalignment  When people with eye misalignment (one eye drifts, eyes aren't perfectly aligned) sustain a concussion, the brain may have difficulty compensating for the misalignment like it once did. This can result in blurry vision, difficulty taking notes in class and focusing on the chalkboard.  Note: This is not an exhaustive list of concussion  signs and symptoms, and it may take a few days for concussion symptoms to appear after the initial injury.  TREATMENT:  THE MOST IMPORTANT THING IS REST AS SOON AS POSSIBLE AFTER INJURY SO THAT THE BRAIN CAN RECOVER. COMPLETE PHYSICAL AND MENTAL REST ARE NEEDED INITIALLY.   THAT MEANS: NO SCHOOL OR WORK FOR AT LEAST 3 DAYS AND CLEARED BY YOUR DOCTOR NO MENTAL EXERTION, MEANING NO WORK, NO HOMEWORK, NO TEST TAKING.  Avoid situations with loud noise, bright lights, or crowds. However, this doesn't mean isolate yourself in a dark room for a week. Too much isolation and boredom can be harmful, contributing to feelings of anxiety, depression, and resulting in increased recovery time. Spend time with friends and family, but monitor your symptoms and avoid situations that make you feel worse.   NO VIDEO GAMES, NO USING THE COMPUTER, NO TEXTING, NO USING SMARTPHONES, NO USE OF AN IPAD OR TABLET. DO NOT GO TO A MOVIE THEATRE OR WATCH SPORTS ON TV. HDTV TENDS TO MAKE PEOPLE FEEL WORSE.   ON APPROXIMATELY DAY 4, BEGIN VERY LIGHT EXERCISE SUCH AS WALKING. DO NOT START ANY STRENUOUS EXERCISE.   You will be given return to school or return to work recommendations.  

## 2023-08-08 NOTE — Telephone Encounter (Signed)
 FYI Only or Action Required?: Action required by provider: request for appointment.  Patient was last seen in primary care on 02/28/2023 by Watt Mirza, MD. Called Nurse Triage reporting Fall and Headache. Symptoms began several days ago. Interventions attempted: OTC medications: tylenol  . Symptoms are: unchanged.  Triage Disposition: See Physician Within 24 Hours  Patient/caregiver understands and will follow disposition?: YesCopied from CRM [id number]. Topic: Clinical - Red Word Triage >> Aug 08, 2023 10:36 AM Rea BROCKS wrote: Red Word that prompted transfer to Nurse Triage: Patient hit head Saturday- might have a concussion, hurts, feeling dazed. Reason for Disposition  [1] No prior tetanus shots (or is not fully vaccinated) AND [2] any wound (e.g., cut, scrape)  Answer Assessment - Initial Assessment Questions 1. MECHANISM: How did the injury happen? For falls, ask: What height did you fall from? and What surface did you fall against?      Hit head on light fixture  2. ONSET: When did the injury happen? (Minutes or hours ago)      Saturday  3. NEUROLOGIC SYMPTOMS: Was there any loss of consciousness? Are there any other neurological symptoms?      Denies  4. MENTAL STATUS: Does the person know who they are, who you are, and where they are?      [place]  5. LOCATION: What part of the head was hit?      Hairline on front above eyebrow  6. SCALP APPEARANCE: What does the scalp look like? Is it bleeding now? If Yes, ask: Is it difficult to stop?      na 7. SIZE: For cuts, bruises, or swelling, ask: How large is it? (e.g., inches or centimeters)      Knot on hairline; yellow  8. PAIN: Is there any pain? If Yes, ask: How bad is it?  (e.g., Scale 1-10; or mild, moderate, severe)     5  10. OTHER SYMPTOMS: Do you have any other symptoms? (e.g., neck pain, vomiting)       Headache, nausea, dazed   Pt hit head on light fixture while serving food. Little bruise/knot on  hairline. Have seen spotters a few times. Pt is nauseous. No vomiting. Pt has been taking tylenol . Pt is answering appropriately.  Protocols used: Head Injury-A-AH

## 2023-08-08 NOTE — Telephone Encounter (Signed)
 Appreciate Dr Eleanore evaluation

## 2023-08-08 NOTE — Progress Notes (Signed)
 Brittany Hinderman T. Kahley Leib, MD, CAQ Sports Medicine [facility] at [facility] [address] [place], [address]  Phone: [phone]  FAX: [phone]  Brittany Sanchez - 53 y.o. female  MRN [id number]  Date of Birth: 1970-08-04  Date: 08/08/2023  PCP: Wendee Lynwood HERO, NP  Referral: Wendee Lynwood HERO, NP  Chief Complaint  Patient presents with   Concussion    Hit head  x 2 on [place] [employer] on Saturday   Subjective:   Brittany Sanchez is a 53 y.o. very pleasant female patient with Body mass index is 26.9 kg/m. who presents with the following:  The patient presents with an acute closed head injury, date of injury August 05, 2023.  She had a preceding head strike roughly August 01, 2023, when she struck her head on the right side.  At that point she did have a headache but she did not have symptoms like she is having now.  Patient has broad symptoms with severe, impairing symptoms.  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 She does not have any neurological issues with the exception of neuropathy, and she has had some anxiety and depression in the past, but this has been stable.  No history of concussion  Yesterday went with her daughter to [employer], and she had to leave because she felt very bad  SCAT 5 Total number of symptoms: 17/22 Symptom severity score: 70/132  She endorses severe headache, pressure in head, feeling slowed down, foggy, does not feel right, difficulty concentrating, fatigue, drowsiness, increased emotional state, irritability, and sadness.  Moderate nausea, dizziness  Mild balance disorder, difficulty with memory, confusion, and anxiety.  She is sleeping okay  Cognitive screen Orientation: 5/5 Immediate memory 14/15  Concentration Digits backwards: 2/4 Muscle reverse order 1/1  Full range of motion at the neck Voms testing induces no symptoms Finger-nose normal,  She is unsteady on tandem gait And BESS  testing is abnormal with standing on the left leg and tandem stance, unsteady   Review of Systems is noted in the HPI, as appropriate  Objective:   BP 90/68   Pulse (!) 56   Temp 98.3 F (36.8 C) (Temporal)   Ht 5' 2.4 (1.585 m)   Wt 149 lb (67.6 kg)   SpO2 97%   BMI 26.90 kg/m   GEN: No acute distress; alert,appropriate. PULM: Breathing comfortably in no respiratory distress PSYCH: Normally interactive.   Neurological exam as above Pupils equal round reactive to light and accommodation, extraocular movements are intact  She does have some mild nausea and dizziness after completing scat 5  Laboratory and Imaging Data:  Assessment and Plan:     ICD-10-CM   1. Concussion without loss of consciousness, initial encounter  S06.0X0A      Total encounter time: 30 minutes. This includes total time spent on the day of encounter.  Concussion evaluation.  Patient has sustained an acute concussion, unfortunately she had a preceding head injury 5 days prior to her second head injury which induced symptoms.  She has a lot of symptoms, many of which are quite severe.  I am going to place her on physical and cognitive rest, and she is already out of work on [employer] due to spine surgery.  She understands that she should not do computer work, work remotely, Patent attorney, computers, HD TV, movies, and progress only as tolerated She will not be driving  I will recheck her in 2 weeks  Patient Instructions  Mild Traumatic Brain Injury (CONCUSSSION):  Jacques Schroeder, MD, [facility] Adapted from [facility], [employer], and [employer]  Think of the human brain almost as an egg yolk and your skull as an egg shell. When your head or body takes a hit, it can cause your brain to shake around inside your skull, injuring the brain. A concussion is not only caused by a hit to the head, but can also be caused by an impact to the body that ends up shaking  your brain in your skull, such as whiplash.  A common misconception about concussion is that one loses consciousness. However, loss of consciousness occurs in only less than 10% of concussion cases.  Concussive symptoms typically resolve in 7 to 10 days (sports-related concussions) or within 3 months (non-athletes).  Approximately 20% of people have symptoms after 6 weeks.  With concussions, we have learned that it generally takes youth longer to recover from concussions. Another thing that we now know about concussions is that it usually takes female athletes longer to recover than female athletes.  No two concussions are identical. In fact, there are at least six different clinical trajectories that concussions may take.  Signs and Symptoms of Concussion:  The signs and symptoms of a concussion are incredibly important because a concussion doesn't show up on imaging like an x-ray, CT, or MRI scan and there is no objective test, like a blood or saliva test, that can determine if a patient has a concussion. A doctor makes a concussion diagnosis based on the results of a comprehensive examination, which includes observing signs of concussion and patients reporting symptoms of concussion appearing after an impact to the head or body. Concussion signs and symptoms are the brain's way of showing it is injured and not functioning normally.  CONCUSSION SIGNS  Concussion signs are what someone could observe about you to determine if you have a concussion.   Common concussion signs include:  Loss of consciousness Problems with balance Glazed look in the eyes Amnesia Delayed response to questions Forgetting an instruction, confusion about an assignment or position, or  confusion of the game, score, or opponent Inappropriate crying Inappropriate laughter Vomiting  CONCUSSION SYMPTOMS  Concussion symptoms are what someone who is concussed will tell you that they are experiencing. Concussion  symptoms typically fall into six major categories:  1- Somatic (Physical) Symptoms  Headache Light-headedness Dizziness Nausea Sensitivity to light Sensitivity to noise  2- Cognitive Symptoms  Difficulties with attention Memory problems Loss of focus Difficulty multitasking Difficulty completing mental tasks  3- Sleep Symptoms  Sleeping more than usual Sleeping less than usual Having trouble falling asleep  4- Emotional Symptoms  Anxiety Depression Panic attacks  5 - Vestibular Symptoms  The vestibular system is affected in nearly 60% of youth and adolescent athletes following a concussion.  But what is the vestibular system?  It's the sensory system that helps with your sense of balance and spatial orientation. Think of a gyroscope! With help from your inner ears, your vestibular system detects the motion or position of your head in space. It sends information to your brain that's needed for balance and stable vision.  Need an example? If you're moving and looking at moving objects at the same time (think riding in a car), you're able to stay focused and not lose visual clarity. During a vestibular concussion, your gyroscope isn't working at full potential.  Difficulty with balance Dizziness. It may feel like  the room is spinning or a slow, wavy sensation. (Like you're on a boat!) Trouble stabilizing vision when moving your head. (We call this Vestibular-Ocular Reflux, or VOR.) Think back to riding in a car. With a vestibular concussion, you can't stay focused. (The technical name for this is Visual Motion Sensitivity, or VMS.) Triggers  These 3 things could bring on vestibular symptoms:  Dynamic movements Busy environments, like the grocery store Crowds  6 - Oculo-motor  A concussion that affects the ocular, or visual, system of the brain. Typically, patients with ocular-motor concussions report pressure headaches in the front of their head, feeling more  tired than normal, and becoming more symptomatic doing math or science exercises at school.  Patients also experience difficulties with their eyes working together. Follow along to explore some of the most common symptoms.  Convergence  The eyes converge when viewing objects up close, such as with reading. With convergence problems, patients may see a double image as a target moves closer to them. Typically, without a concussion, objects can be brought very close without doubling.  Accommodation  Accommodation problems cause an object to become blurry as it is viewed up close. Accommodative and convergence problems are often experienced together and can impact reading and other near-vision activities.  Pursuits and Saccades  The eyes use pursuit eye movements to follow objects; while saccade eyes movements allow the eyes to shift rapidly from one object to another. Tracking objects, reading a book, scrolling on a computer or even watching for a moving car while crossing the street can be difficult when patients have problems with pursuit and saccade eye movements.  Misalignment  When people with eye misalignment (one eye drifts, eyes aren't perfectly aligned) sustain a concussion, the brain may have difficulty compensating for the misalignment like it once did. This can result in blurry vision, difficulty taking notes in class and focusing on the chalkboard.  Note: This is not an exhaustive list of concussion signs and symptoms, and it may take a few days for concussion symptoms to appear after the initial injury.  TREATMENT:  THE MOST IMPORTANT THING IS REST AS SOON AS POSSIBLE AFTER INJURY SO THAT THE BRAIN CAN RECOVER. COMPLETE PHYSICAL AND MENTAL REST ARE NEEDED INITIALLY.   THAT MEANS: NO SCHOOL OR WORK FOR AT LEAST 3 DAYS AND CLEARED BY YOUR DOCTOR NO MENTAL EXERTION, MEANING NO WORK, NO HOMEWORK, NO TEST TAKING.  Avoid situations with loud noise, bright lights, or crowds.  However, this doesn't mean isolate yourself in a dark room for a week. Too much isolation and boredom can be harmful, contributing to feelings of anxiety, depression, and resulting in increased recovery time. Spend time with friends and family, but monitor your symptoms and avoid situations that make you feel worse.   NO VIDEO GAMES, NO USING THE COMPUTER, NO TEXTING, NO USING SMARTPHONES, NO USE OF AN IPAD OR TABLET. DO NOT GO TO A MOVIE THEATRE OR WATCH SPORTS ON TV. HDTV TENDS TO MAKE PEOPLE FEEL WORSE.   ON APPROXIMATELY DAY 4, BEGIN VERY LIGHT EXERCISE SUCH AS WALKING. DO NOT START ANY STRENUOUS EXERCISE.   You will be given return to school or return to work recommendations.    Medication Management during today's office visit: No orders of the defined types were placed in this encounter.  There are no discontinued medications.  Orders placed today for conditions managed today: No orders of the defined types were placed in this encounter.   Disposition: No follow-ups on  file.  Dr[employer] One speech-to-text software was used for transcription in this dictation.  Possible transcriptional errors can occur using Animal nutritionist.   Signed,  Jacques DASEN. Alisah Grandberry, MD   Outpatient Encounter Medications as of 08/08/2023  Medication Sig   acetaminophen  (TYLENOL ) 500 MG tablet Take 500 mg by mouth every 6 (six) hours as needed for moderate pain (pain score 4-6).   estradiol  (VIVELLE -DOT) 0.0375 MG/24HR Place 1 patch onto the skin every 3 (three) days.   FLUoxetine  (PROZAC ) 20 MG capsule Take 20 mg by mouth daily.   gabapentin  (NEURONTIN ) 300 MG capsule Take 1 capsule (300 mg total) by mouth 3 (three) times daily.   ibuprofen (ADVIL) 200 MG tablet Take 600 mg by mouth every 6 (six) hours as needed for moderate pain (pain score 4-6).   LITHIUM PO Take 5 mg by mouth daily.   methocarbamol  (ROBAXIN ) 500 MG tablet Take 1 tablet (500 mg total) by mouth every 6 (six) hours as needed.   NP  THYROID  90 MG tablet Take 90 mg by mouth daily.   OVER THE COUNTER MEDICATION daily. PREGNOLENE   oxyCODONE  (ROXICODONE ) 5 MG immediate release tablet Take 1 tablet (5 mg total) by mouth every 4 (four) hours as needed for severe pain (pain score 7-10).   PRESCRIPTION MEDICATION Testosterone  2% Cream- APPLY 0.5 gram (2 CLICKS) once a day behind the knee (DECREASE DOSE TO 1/4 GM IF OILY SKIN, ANCE OR HAIR GROWTH OCCURS   progesterone  (PROMETRIUM ) 100 MG capsule Take 200 mg by mouth at bedtime.   senna (SENOKOT) 8.6 MG TABS tablet Take 1 tablet (8.6 mg total) by mouth 2 (two) times daily as needed for mild constipation.   traZODone  (DESYREL ) 50 MG tablet Take 0.5 tablets (25 mg total) by mouth as needed. (Patient taking differently: Take 50 mg by mouth at bedtime.)   Vitamin D -Vitamin K (K2-D3 MAX PO) Take 1 capsule by mouth daily.   No facility-administered encounter medications on file as of 08/08/2023.

## 2023-08-20 ENCOUNTER — Other Ambulatory Visit: Payer: Self-pay | Admitting: Nurse Practitioner

## 2023-08-20 DIAGNOSIS — F419 Anxiety disorder, unspecified: Secondary | ICD-10-CM

## 2023-08-21 NOTE — Progress Notes (Unsigned)
     Ed Rayson T. Miciah Shealy, MD, CAQ Sports Medicine [facility] at [facility] [address] [place], [address]  Phone: [phone]  FAX: [phone]  Brittany Sanchez - 53 y.o. female  MRN [id number]  Date of Birth: 1971/02/01  Date: 08/22/2023  PCP: Wendee Lynwood HERO, NP  Referral: Wendee Lynwood HERO, NP  No chief complaint on file.  Subjective:   Brittany Sanchez is a 53 y.o. very pleasant female patient with There is no height or weight on file to calculate BMI. who presents with the following:  Patient presents for concussion follow-up.  I last saw her in August 08, 2023.  Date of injury August 05, 2023.  She had a head strike on August 01, 2023, and subsequently hit her head again on the above day.  She has had broad symptoms with 17/2022 symptom score on scat 5 Symptom severity score at 70/132 She does have a history of anxiety and depression, and some neuropathy.  She should remain out of work, in when I saw her last, she was already out on FMLA due to spine surgery.    Review of Systems is noted in the HPI, as appropriate  Objective:   There were no vitals taken for this visit.  GEN: No acute distress; alert,appropriate. PULM: Breathing comfortably in no respiratory distress PSYCH: Normally interactive.   Laboratory and Imaging Data:  Assessment and Plan:   ***

## 2023-08-22 ENCOUNTER — Encounter: Payer: Self-pay | Admitting: Family Medicine

## 2023-08-22 ENCOUNTER — Ambulatory Visit (INDEPENDENT_AMBULATORY_CARE_PROVIDER_SITE_OTHER): Admitting: Family Medicine

## 2023-08-22 ENCOUNTER — Telehealth: Payer: Self-pay | Admitting: Neurosurgery

## 2023-08-22 VITALS — BP 102/70 | HR 67 | Temp 98.2°F | Ht 62.4 in | Wt 147.2 lb

## 2023-08-22 DIAGNOSIS — S060X0A Concussion without loss of consciousness, initial encounter: Secondary | ICD-10-CM

## 2023-08-22 DIAGNOSIS — M5416 Radiculopathy, lumbar region: Secondary | ICD-10-CM

## 2023-08-22 DIAGNOSIS — Z9889 Other specified postprocedural states: Secondary | ICD-10-CM

## 2023-08-22 NOTE — Telephone Encounter (Signed)
 Needs CPE in 30 days to continue getting refills. Any open slot is ok

## 2023-08-22 NOTE — Telephone Encounter (Signed)
 Refill of neurontin  sent to pharmacy. Please let her know.

## 2023-08-22 NOTE — Telephone Encounter (Signed)
 Patient notified via Mychart.

## 2023-08-28 NOTE — Progress Notes (Addendum)
   REFERRING PHYSICIAN:  Wendee Lynwood HERO, [address] [place],  [place] [address]  DOS: 06/22/23  right L5-S1 far lateral discectomy  HISTORY OF PRESENT ILLNESS:  She was still having right sided LBP at last visit. Her leg pain was some better. She was started on neurontin  at her last visit.   Her right leg is better since surgery, but she still has intermittent right lateral leg pain to her foot that is worse with walking, increased activity.   She continues with constant LBP and pain in her tailbone. She has pain with sitting. She has spasms in her back that have not  improved with surgery- these start when she tries to increase her activity.    She has a right THA.   She was unable to tolerate neurontin  so she stopped it.    PHYSICAL EXAMINATION:  General: Patient is well developed, well nourished, calm, collected, and in no apparent distress.   NEUROLOGICAL:  General: In no acute distress.   Awake, alert, oriented to person, place, and time.  Pupils equal round and reactive to light.  Facial tone is symmetric.    Strength:          Side Iliopsoas Quads Hamstring PF DF EHL  R 5 5 5 5 5 5   L 5 5 5 5 5 5    Incision well healed  No pain with IR/ER of her right hip.   She is uncomfortable with sitting. Stands during most of the visit.   She limps with ambulation.    ROS (Neurologic):  Negative except as noted above  IMAGING: Nothing new to review.   ASSESSMENT/PLAN:  Brittany Sanchez is doing fair s/p above surgery. She continues with significant back pain and intermittent right leg pain. Treatment options reviewed with patient and following plan made:   - MRI of lumbar spine to evaluate persistent right leg pain s/p surgery.  - EMG/NCS of bilateral lower extremities to evaluate right leg pain. Orders to [facility].  - Follow up with ortho at [facility] for right hip and further imaging.  - Given pamphlet on SCS to review.  - Slowly return to activity as  tolerated.  - Will review above results with Dr. Clois and then determine follow up. Can message her with MRI results as this will likely be done prior to EMG.   Advised to contact the office if any questions or concerns arise.  Glade Boys PA-C Department of neurosurgery

## 2023-09-01 ENCOUNTER — Encounter: Admitting: Orthopedic Surgery

## 2023-09-05 ENCOUNTER — Encounter: Admitting: Orthopedic Surgery

## 2023-09-08 ENCOUNTER — Ambulatory Visit (INDEPENDENT_AMBULATORY_CARE_PROVIDER_SITE_OTHER): Admitting: Orthopedic Surgery

## 2023-09-08 ENCOUNTER — Encounter: Payer: Self-pay | Admitting: Orthopedic Surgery

## 2023-09-08 VITALS — BP 136/88 | Temp 98.1°F | Ht 62.0 in | Wt 140.0 lb

## 2023-09-08 DIAGNOSIS — Z09 Encounter for follow-up examination after completed treatment for conditions other than malignant neoplasm: Secondary | ICD-10-CM

## 2023-09-08 DIAGNOSIS — Z9889 Other specified postprocedural states: Secondary | ICD-10-CM

## 2023-09-08 DIAGNOSIS — M5416 Radiculopathy, lumbar region: Secondary | ICD-10-CM

## 2023-09-08 NOTE — Patient Instructions (Signed)
 It was so nice to see you today. Thank you so much for coming in.    I am sorry you are not feeling better.   I want to get an MRI of your lower back to look into things further. We will get this approved through your insurance and [facility] will call you to schedule the appointment. Ask about your patient responsibility. You do not need to pay this prior to getting MRI, they can bill you.   [facility] (building with the white pillars) is located off of [address]. The address is [address], [place], [place] [address].    After you have the MRI, it can take 14-28 days for me to get the results back. If I don't have them in 2 weeks, we will call to try to get the results.   I want to get an EMG (nerve conduction test) to look into things further. I have ordered this and [facility] will call you to schedule. You can also call them at [phone].   Follow up with [facility] regarding your right hip and further imaging.   Once I have the MRI and EMG results, I will review with Dr. Clois and we will schedule a follow up.   Please do not hesitate to call if you have any questions or concerns. You can also message me in MyChart.   Glade Boys PA-C [phone]     The physicians and staff at [facility] at [facility] are committed to providing excellent care. You may receive a survey asking for feedback about your experience at our office. We value you your feedback and appreciate you taking the time to to fill it out. The [facility] leadership team is also available to discuss your experience in person, feel free to contact us  [phone].

## 2023-09-09 ENCOUNTER — Encounter: Payer: Self-pay | Admitting: Neurology

## 2023-09-09 ENCOUNTER — Other Ambulatory Visit: Payer: Self-pay

## 2023-09-09 DIAGNOSIS — R202 Paresthesia of skin: Secondary | ICD-10-CM

## 2023-09-26 ENCOUNTER — Encounter: Admitting: Neurology

## 2023-10-07 ENCOUNTER — Encounter: Payer: Self-pay | Admitting: Nurse Practitioner

## 2023-10-07 ENCOUNTER — Ambulatory Visit: Admitting: Nurse Practitioner

## 2023-10-07 VITALS — BP 102/80 | HR 60 | Temp 97.7°F | Ht 62.0 in | Wt 148.2 lb

## 2023-10-07 DIAGNOSIS — R7303 Prediabetes: Secondary | ICD-10-CM

## 2023-10-07 DIAGNOSIS — E039 Hypothyroidism, unspecified: Secondary | ICD-10-CM | POA: Diagnosis not present

## 2023-10-07 DIAGNOSIS — E663 Overweight: Secondary | ICD-10-CM | POA: Diagnosis not present

## 2023-10-07 DIAGNOSIS — Z0000 Encounter for general adult medical examination without abnormal findings: Secondary | ICD-10-CM

## 2023-10-07 DIAGNOSIS — E559 Vitamin D deficiency, unspecified: Secondary | ICD-10-CM

## 2023-10-07 DIAGNOSIS — Z13220 Encounter for screening for lipoid disorders: Secondary | ICD-10-CM

## 2023-10-07 DIAGNOSIS — G4700 Insomnia, unspecified: Secondary | ICD-10-CM

## 2023-10-07 DIAGNOSIS — E538 Deficiency of other specified B group vitamins: Secondary | ICD-10-CM | POA: Diagnosis not present

## 2023-10-07 DIAGNOSIS — F419 Anxiety disorder, unspecified: Secondary | ICD-10-CM | POA: Diagnosis not present

## 2023-10-07 DIAGNOSIS — F32A Depression, unspecified: Secondary | ICD-10-CM

## 2023-10-07 DIAGNOSIS — Z1231 Encounter for screening mammogram for malignant neoplasm of breast: Secondary | ICD-10-CM

## 2023-10-07 LAB — LIPID PANEL
Cholesterol: 174 mg/dL (ref 0–200)
HDL: 56.9 mg/dL (ref >39.00)
LDL Cholesterol: 88 mg/dL (ref 0–99)
NonHDL: 117.21
Total CHOL/HDL Ratio: 3
Triglycerides: 146 mg/dL (ref 0.0–149.0)
VLDL: 29.2 mg/dL (ref 0.0–40.0)

## 2023-10-07 LAB — COMPREHENSIVE METABOLIC PANEL WITH GFR
ALT: 24 U/L (ref 0–35)
AST: 25 U/L (ref 0–37)
Albumin: 4.3 g/dL (ref 3.5–5.2)
Alkaline Phosphatase: 41 U/L (ref 39–117)
BUN: 22 mg/dL (ref 6–23)
CO2: 29 meq/L (ref 19–32)
Calcium: 9.1 mg/dL (ref 8.4–10.5)
Chloride: 102 meq/L (ref 96–112)
Creatinine, Ser: 1.07 mg/dL (ref 0.40–1.20)
GFR: 59.16 mL/min — ABNORMAL LOW (ref >60.00)
Glucose, Bld: 104 mg/dL — ABNORMAL HIGH (ref 70–99)
Potassium: 4.6 meq/L (ref 3.5–5.1)
Sodium: 138 meq/L (ref 135–145)
Total Bilirubin: 0.5 mg/dL (ref 0.2–1.2)
Total Protein: 6.3 g/dL (ref 6.0–8.3)

## 2023-10-07 LAB — CBC WITH DIFFERENTIAL/PLATELET
Basophils Absolute: 0 K/uL (ref 0.0–0.1)
Basophils Relative: 0.6 % (ref 0.0–3.0)
Eosinophils Absolute: 0.1 K/uL (ref 0.0–0.7)
Eosinophils Relative: 2.4 % (ref 0.0–5.0)
HCT: 40 % (ref 36.0–46.0)
Hemoglobin: 13.5 g/dL (ref 12.0–15.0)
Lymphocytes Relative: 28.2 % (ref 12.0–46.0)
Lymphs Abs: 1.5 K/uL (ref 0.7–4.0)
MCHC: 33.6 g/dL (ref 30.0–36.0)
MCV: 91.8 fl (ref 78.0–100.0)
Monocytes Absolute: 0.5 K/uL (ref 0.1–1.0)
Monocytes Relative: 8.5 % (ref 3.0–12.0)
Neutro Abs: 3.3 K/uL (ref 1.4–7.7)
Neutrophils Relative %: 60.3 % (ref 43.0–77.0)
Platelets: 240 K/uL (ref 150.0–400.0)
RBC: 4.36 Mil/uL (ref 3.87–5.11)
RDW: 12.8 % (ref 11.5–15.5)
WBC: 5.5 K/uL (ref 4.0–10.5)

## 2023-10-07 LAB — VITAMIN B12: Vitamin B-12: 395 pg/mL (ref 211–911)

## 2023-10-07 LAB — HEMOGLOBIN A1C: Hgb A1c MFr Bld: 6 % (ref 4.6–6.5)

## 2023-10-07 LAB — TSH: TSH: 0.91 u[IU]/mL (ref 0.35–5.50)

## 2023-10-07 LAB — VITAMIN D 25 HYDROXY (VIT D DEFICIENCY, FRACTURES): VITD: 42.7 ng/mL (ref 30.00–100.00)

## 2023-10-07 NOTE — Assessment & Plan Note (Signed)
History of the same pending A1c

## 2023-10-07 NOTE — Assessment & Plan Note (Signed)
 History of same currently maintained on fluoxetine  20 mg daily.  Continue

## 2023-10-07 NOTE — Assessment & Plan Note (Signed)
History of the same pending vitamin D level today

## 2023-10-07 NOTE — Assessment & Plan Note (Signed)
 Patient is on NP thyroid  90 mg 2-3 times a week.  Pending TSH.  This is managed through [facility]

## 2023-10-07 NOTE — Assessment & Plan Note (Signed)
History of the same pending B12 level 

## 2023-10-07 NOTE — Assessment & Plan Note (Signed)
 Patient currently maintained on trazodone  50 mg nightly.  Continue

## 2023-10-07 NOTE — Assessment & Plan Note (Signed)
 Discussed age-appropriate immunizations and screening exams.  Did review patient's personal, surgical, social, family histories.  Patient is up-to-date on all age-appropriate vaccinations she would like.  Patient declined flu, shingles, pneumonia vaccine today.  Patient is up-to-date on CRC screening, cervical cancer screening.  Mammogram order placed today.  Patient is too young for osteoporosis screening.  Patient was given information at discharge about preventative healthcare maintenance with anticipatory guidance.

## 2023-10-07 NOTE — Progress Notes (Signed)
 Established Patient Office Visit  Subjective   Patient ID: Brittany Sanchez, female    DOB: 11-01-1970  Age: 53 y.o. MRN: [id number]  Chief Complaint  Patient presents with   Annual Exam    Declines vaccines     HPI  Anxiety: Currently maintained on fluoxetine  20 mg daily.  HRT: Currently maintained on estradiol  patch and progesterone  200 mg at bedtime. This is done through [facility]   Insomnia: Currently maintained on trazodone  50 mg nightly  Hypothyroidism?:  Currently maintained on NP thyroid  90 mg. She takes it 2-3 times a week   for complete physical and follow up of chronic conditions.  Immunizations: -Tetanus: Completed in 2019 -Influenza: Refused -Shingles: Refused -Pneumonia: Refused  Diet: Fair diet. She is eating 4 meals and does not snack. Coffee and water   Exercise: she is doing piliates twice a week for 45 mins and she teaches a zumba  Eye exam: needs updating . Wears glasses Dental exam: Completes semi-annually    Colonoscopy: Completed in 09/09/2020 Lung Cancer Screening: N/A  Pap smear: Last done 2023 through [facility]  Mammogram: 11/11/2021.  Order for [employer]   DEXA: Too young  Sleep: goes to bed around 10 and will get u around 6. She uses trazodone  and feels rested        Review of Systems  Constitutional:  Negative for chills and fever.  Respiratory:  Negative for shortness of breath.   Cardiovascular:  Negative for chest pain and leg swelling.  Gastrointestinal:  Negative for abdominal pain, blood in stool, constipation, diarrhea, nausea and vomiting.       BM daily   Genitourinary:  Negative for dysuria and hematuria.  Neurological:  Positive for tingling (wide spread since lymes). Negative for dizziness and headaches.  Psychiatric/Behavioral:  Negative for hallucinations and suicidal ideas.       Objective:     BP 102/80   Pulse 60   Temp 97.7 F (36.5 C) (Oral)   Ht 5' 2 (1.575 m)   Wt  148 lb 3.2 oz (67.2 kg)   SpO2 98%   BMI 27.11 kg/m  BP Readings from Last 3 Encounters:  10/07/23 102/80  09/08/23 136/88  08/22/23 102/70   Wt Readings from Last 3 Encounters:  10/07/23 148 lb 3.2 oz (67.2 kg)  09/08/23 140 lb (63.5 kg)  08/22/23 147 lb 4 oz (66.8 kg)   SpO2 Readings from Last 3 Encounters:  10/07/23 98%  08/22/23 97%  08/08/23 97%      Physical Exam Vitals and nursing note reviewed.  Constitutional:      Appearance: Normal appearance.  HENT:     Right Ear: Tympanic membrane, ear canal and external ear normal.     Left Ear: Tympanic membrane, ear canal and external ear normal.     Mouth/Throat:     Mouth: Mucous membranes are moist.     Pharynx: Oropharynx is clear.  Eyes:     Extraocular Movements: Extraocular movements intact.     Pupils: Pupils are equal, round, and reactive to light.  Cardiovascular:     Rate and Rhythm: Normal rate and regular rhythm.     Pulses: Normal pulses.     Heart sounds: Normal heart sounds.  Pulmonary:     Effort: Pulmonary effort is normal.     Breath sounds: Normal breath sounds.  Abdominal:     General: Bowel sounds are normal. There is no distension.     Palpations: There is no mass.  Tenderness: There is no abdominal tenderness.     Hernia: No hernia is present.  Musculoskeletal:     Right lower leg: No edema.     Left lower leg: No edema.  Lymphadenopathy:     Cervical: No cervical adenopathy.  Skin:    General: Skin is warm.  Neurological:     General: No focal deficit present.     Mental Status: She is alert.     Deep Tendon Reflexes:     Reflex Scores:      Bicep reflexes are 2+ on the right side and 2+ on the left side.      Patellar reflexes are 2+ on the right side and 2+ on the left side.    Comments: Bilateral upper and lower extremity strength 5/5  Psychiatric:        Mood and Affect: Mood normal.        Behavior: Behavior normal.        Thought Content: Thought content normal.         Judgment: Judgment normal.      No results found for any visits on 10/07/23.    The 10-year ASCVD risk score (Arnett DK, et al., 2019) is: 0.9%    Assessment & Plan:   Problem List Items Addressed This Visit       Endocrine   Hypothyroidism   Patient is on NP thyroid  90 mg 2-3 times a week.  Pending TSH.  This is managed through [facility]      Relevant Orders   TSH     Other   Anxiety and depression   History of same currently maintained on fluoxetine  20 mg daily.  Continue      B12 deficiency   History of the same pending B12 level      Relevant Orders   Vitamin B12   Insomnia   Patient currently maintained on trazodone  50 mg nightly.  Continue      Relevant Orders   VITAMIN D  25 Hydroxy (Vit-D Deficiency, Fractures)   TSH   Vitamin D  deficiency   History of the same pending vitamin D  level today      Relevant Orders   VITAMIN D  25 Hydroxy (Vit-D Deficiency, Fractures)   Preventative health care - Primary   Discussed age-appropriate immunizations and screening exams.  Did review patient's personal, surgical, social, family histories.  Patient is up-to-date on all age-appropriate vaccinations she would like.  Patient declined flu, shingles, pneumonia vaccine today.  Patient is up-to-date on CRC screening, cervical cancer screening.  Mammogram order placed today.  Patient is too young for osteoporosis screening.  Patient was given information at discharge about preventative healthcare maintenance with anticipatory guidance.      Relevant Orders   CBC with Differential/Platelet   Comprehensive metabolic panel with GFR   Pre-diabetes   History of the same pending A1c      Relevant Orders   Lipid panel   Hemoglobin A1c   Other Visit Diagnoses       Screening for lipid disorders         Overweight       Relevant Orders   Lipid panel   Hemoglobin A1c     Screening mammogram for breast cancer       Relevant Orders   MM 3D  SCREENING MAMMOGRAM BILATERAL BREAST       Return in about 1 year (around 10/06/2024) for CPE and Labs.    Adina Crandall, NP

## 2023-10-07 NOTE — Patient Instructions (Signed)
 Nice to see you today I will be In touch with the labs once I have them Follow up with me in 1 year, sooner if you need me    Call and schedule your mammogram at   [facility] [address] ( on hospital grounds) [place], [place]  [phone]

## 2023-10-10 ENCOUNTER — Encounter: Payer: Self-pay | Admitting: Nurse Practitioner

## 2023-10-10 ENCOUNTER — Ambulatory Visit: Payer: Self-pay | Admitting: Nurse Practitioner

## 2023-10-10 DIAGNOSIS — R7303 Prediabetes: Secondary | ICD-10-CM

## 2023-10-30 ENCOUNTER — Other Ambulatory Visit: Payer: Self-pay | Admitting: Nurse Practitioner

## 2023-10-30 DIAGNOSIS — F419 Anxiety disorder, unspecified: Secondary | ICD-10-CM

## 2023-11-02 ENCOUNTER — Telehealth: Payer: Self-pay | Admitting: Neurosurgery

## 2023-11-02 NOTE — Telephone Encounter (Signed)
 Patient advised and letter sent via mychart and patient aware

## 2023-11-02 NOTE — Telephone Encounter (Signed)
 Pt would like a Dr. Note to cancel Piliates class, says it is too much on her back and hips.

## 2023-11-02 NOTE — Telephone Encounter (Signed)
 DOS: 06/22/23 right L5-S1 far lateral discectomy   Last seen by me on 09/08/23. EMG scheduled on 1016/25. Did not have lumbar MRI done.   Okay to give note to cancel pilates.

## 2023-11-17 ENCOUNTER — Ambulatory Visit (INDEPENDENT_AMBULATORY_CARE_PROVIDER_SITE_OTHER): Admitting: Neurology

## 2023-11-17 DIAGNOSIS — R202 Paresthesia of skin: Secondary | ICD-10-CM | POA: Diagnosis not present

## 2023-11-17 NOTE — Procedures (Signed)
 [facility]  [address] [place], [address]  [place], [place] [address] Tel: [phone] Fax: [phone] Test Date:  11/17/2023  Patient: Brittany Sanchez DOB: 1970-02-09 Physician: Tonita Blanch, DO  Sex: Female Height: 5' 2 Ref Phys: Glade Boys, DEVONNA  ID#: [id number]   Technician:    History: This is a 53 year old female with history of right L5-S1 discectomy referred for evaluation of right leg pain.  NCV & EMG Findings: Electrodiagnostic testing of the right lower extremity and additional studies of the left shows: Bilateral sural and superficial peroneal sensory responses are within normal limits. Bilateral peroneal and tibial motor responses are within normal limits. Bilateral tibial H reflex studies are within normal limits. There is no evidence of active or chronic motor axonal changes affecting any of the tested muscles.  Motor unit configuration and recruitment pattern is within normal limits.  Impression: This is a normal study of the lower extremities.  In particular, there is no evidence of a large fiber sensorimotor polyneuropathy or lumbosacral radiculopathy.    ___________________________ Tonita Blanch, DO    Nerve Conduction Studies   Stim Site NR Peak (ms) Norm Peak (ms) O-P Amp (V) Norm O-P Amp  Left Sup Peroneal Anti Sensory (Ant Lat Mall)  32 C  12 cm    2.1 <4.6 14.3 >4  Right Sup Peroneal Anti Sensory (Ant Lat Mall)  32 C  12 cm    2.2 <4.6 13.1 >4  Left Sural Anti Sensory (Lat Mall)  32 C  Calf    2.4 <4.6 21.6 >4  Right Sural Anti Sensory (Lat Mall)  32 C  Calf    2.3 <4.6 20.3 >4     Stim Site NR Onset (ms) Norm Onset (ms) O-P Amp (mV) Norm O-P Amp Site1 Site2 Delta-0 (ms) Dist (cm) Vel (m/s) Norm Vel (m/s)  Left Peroneal Motor (Ext Dig Brev)  32 C  Ankle    5.0 <6.0 3.6 >2.5 B Fib Ankle 5.9 31.0 53 >40  B Fib    10.9  3.5  Poplt B Fib 1.8 8.0 44 >40  Poplt    12.7  3.4         Right Peroneal Motor (Ext Dig Brev)  32 C  Ankle     4.7 <6.0 3.0 >2.5 B Fib Ankle 6.7 35.0 52 >40  B Fib    11.4  2.9  Poplt B Fib 1.5 8.0 53 >40  Poplt    12.9  2.8         Left Tibial Motor (Abd Hall Brev)  32 C  Ankle    3.8 <6.0 10.7 >4 Knee Ankle 6.7 38.0 57 >40  Knee    10.5  10.5         Right Tibial Motor (Abd Hall Brev)  32 C  Ankle    3.0 <6.0 15.1 >4 Knee Ankle 7.1 38.0 54 >40  Knee    10.1  12.3          Electromyography   Side Muscle Ins.Act Fibs Fasc Recrt Amp Dur Poly Activation Comment  Right AntTibialis Nml Nml Nml Nml Nml Nml Nml Nml N/A  Right Gastroc Nml Nml Nml Nml Nml Nml Nml Nml N/A  Right Flex Dig Long Nml Nml Nml Nml Nml Nml Nml Nml N/A  Right RectFemoris Nml Nml Nml Nml Nml Nml Nml Nml N/A  Right GluteusMed Nml Nml Nml Nml Nml Nml Nml Nml N/A  Left AntTibialis Nml Nml Nml Nml Nml Nml  Nml Nml N/A  Left Gastroc Nml Nml Nml Nml Nml Nml Nml Nml N/A  Left Flex Dig Long Nml Nml Nml Nml Nml Nml Nml Nml N/A  Left RectFemoris Nml Nml Nml Nml Nml Nml Nml Nml N/A  Left GluteusMed Nml Nml Nml Nml Nml Nml Nml Nml N/A      Waveforms:

## 2023-11-23 NOTE — Progress Notes (Unsigned)
   Telephone Visit- Progress Note: Referring Physician:  Wendee Lynwood HERO, NP [address] [place],  [place] [address]  Primary Physician:  Wendee Lynwood HERO, NP  This visit was performed via telephone.  Patient location: home Provider location: working from home  I spent a total of 10 minutes non-face-to-face activities for this visit on the date of this encounter including review of current clinical condition and response to treatment.    Patient has given verbal consent to this telephone visits and we reviewed the limitations of a telephone visit. Patient wishes to proceed.    DOS: 06/22/23  right L5-S1 far lateral discectomy  HISTORY OF PRESENT ILLNESS:  Last seen by me on 09/08/23- she was still having significant back pian and intermittent right leg pain.   MRI of lumbar spine was ordered and has not been done yet.   Phone visit scheduled to review her EMG results.   Her right sided LBP has improved and it is now intermittent. She still has pain with prolonged sitting. Her right lateral leg pain to her foot is much better! She only has occasional aching in the leg.   She is having more pain in her right > left hip- she is seeing ortho. Had MRI, labs, and is getting a bone scan. She has a right THA.   She continues on oxycodone  5mg  tid and prn robaxin . She is getting this from pain management at [facility].    Exam: No exam done as this was a telephone encounter.     Imaging: NCV & EMG Findings: Electrodiagnostic testing of the right lower extremity and additional studies of the left shows: Bilateral sural and superficial peroneal sensory responses are within normal limits. Bilateral peroneal and tibial motor responses are within normal limits. Bilateral tibial H reflex studies are within normal limits. There is no evidence of active or chronic motor axonal changes affecting any of the tested muscles.  Motor unit configuration and recruitment pattern is within normal  limits.   Impression: This is a normal study of the lower extremities.  In particular, there is no evidence of a large fiber sensorimotor polyneuropathy or lumbosacral radiculopathy.       ___________________________ Tonita Blanch, DO   Assessment and Plan: Brittany Sanchez is doing better regarding her back. Her right sided LBP has improved and it is now intermittent. She still has pain with prolonged sitting. Her right lateral leg pain to her foot is much better! She only has occasional aching in the leg.   Above EMG is normal.   She is having more pain in her right > left hip- she is seeing ortho. Had MRI, labs, and is getting a bone scan. She has a right THA.   Treatment options reviewed with patient and following plan made:   - Continue to follow with ortho and pain management at [facility].  - We discussed lumbar MRI- will hold on this for now as pain is improved. Can revisit if pain gets worse.  - She will follow up prn at her request.    Advised to contact the office if any questions or concerns arise.  Glade Boys PA-C Neurosurgery

## 2023-11-24 ENCOUNTER — Ambulatory Visit: Admission: RE | Admit: 2023-11-24 | Source: Ambulatory Visit

## 2023-11-24 ENCOUNTER — Other Ambulatory Visit (HOSPITAL_COMMUNITY): Payer: Self-pay | Admitting: Orthopedic Surgery

## 2023-11-24 DIAGNOSIS — Z96641 Presence of right artificial hip joint: Secondary | ICD-10-CM

## 2023-11-25 ENCOUNTER — Ambulatory Visit (INDEPENDENT_AMBULATORY_CARE_PROVIDER_SITE_OTHER): Admitting: Orthopedic Surgery

## 2023-11-25 ENCOUNTER — Encounter: Payer: Self-pay | Admitting: Orthopedic Surgery

## 2023-11-25 DIAGNOSIS — M25551 Pain in right hip: Secondary | ICD-10-CM | POA: Diagnosis not present

## 2023-11-25 DIAGNOSIS — M545 Low back pain, unspecified: Secondary | ICD-10-CM

## 2023-11-25 DIAGNOSIS — M5416 Radiculopathy, lumbar region: Secondary | ICD-10-CM

## 2023-11-25 DIAGNOSIS — M25552 Pain in left hip: Secondary | ICD-10-CM

## 2023-11-25 DIAGNOSIS — Z9889 Other specified postprocedural states: Secondary | ICD-10-CM

## 2023-12-01 ENCOUNTER — Encounter (HOSPITAL_COMMUNITY)

## 2023-12-09 ENCOUNTER — Encounter (HOSPITAL_COMMUNITY)
Admission: RE | Admit: 2023-12-09 | Discharge: 2023-12-09 | Disposition: A | Discharge status: Home / Self Care | Source: Ambulatory Visit | Attending: Orthopedic Surgery | Admitting: Orthopedic Surgery

## 2023-12-09 ENCOUNTER — Encounter (HOSPITAL_COMMUNITY): Payer: Self-pay

## 2023-12-09 DIAGNOSIS — Z96641 Presence of right artificial hip joint: Secondary | ICD-10-CM | POA: Insufficient documentation

## 2023-12-09 MED ORDER — TECHNETIUM TC 99M MEDRONATE IV KIT
20.0000 | PACK | Freq: Once | INTRAVENOUS | Status: AC
  Administered 2023-12-09: 20 via INTRAVENOUS

## 2024-10-12 ENCOUNTER — Encounter: Admitting: Nurse Practitioner

## 2024-12-13 ENCOUNTER — Ambulatory Visit: Payer: BC Managed Care – PPO | Admitting: Dermatology
